# Patient Record
Sex: Female | Born: 1945 | Race: White | Hispanic: No | Marital: Single | Smoking: Never smoker
Health system: Southern US, Community
[De-identification: ages and names within clinical notes are randomized; demographics above are authoritative.]

## PROBLEM LIST (undated history)

## (undated) DIAGNOSIS — F329 Major depressive disorder, single episode, unspecified: Secondary | ICD-10-CM

## (undated) DIAGNOSIS — Z9989 Dependence on other enabling machines and devices: Secondary | ICD-10-CM

## (undated) DIAGNOSIS — F32A Depression, unspecified: Secondary | ICD-10-CM

## (undated) DIAGNOSIS — Z9889 Other specified postprocedural states: Secondary | ICD-10-CM

## (undated) DIAGNOSIS — M810 Age-related osteoporosis without current pathological fracture: Secondary | ICD-10-CM

## (undated) DIAGNOSIS — F411 Generalized anxiety disorder: Secondary | ICD-10-CM

## (undated) DIAGNOSIS — I447 Left bundle-branch block, unspecified: Secondary | ICD-10-CM

## (undated) DIAGNOSIS — I491 Atrial premature depolarization: Secondary | ICD-10-CM

## (undated) DIAGNOSIS — I440 Atrioventricular block, first degree: Secondary | ICD-10-CM

## (undated) DIAGNOSIS — M1711 Unilateral primary osteoarthritis, right knee: Secondary | ICD-10-CM

## (undated) DIAGNOSIS — K219 Gastro-esophageal reflux disease without esophagitis: Secondary | ICD-10-CM

## (undated) DIAGNOSIS — R897 Abnormal histological findings in specimens from other organs, systems and tissues: Secondary | ICD-10-CM

## (undated) DIAGNOSIS — M797 Fibromyalgia: Secondary | ICD-10-CM

## (undated) DIAGNOSIS — G4733 Obstructive sleep apnea (adult) (pediatric): Secondary | ICD-10-CM

## (undated) DIAGNOSIS — K589 Irritable bowel syndrome without diarrhea: Secondary | ICD-10-CM

## (undated) DIAGNOSIS — R001 Bradycardia, unspecified: Secondary | ICD-10-CM

## (undated) DIAGNOSIS — M351 Other overlap syndromes: Secondary | ICD-10-CM

## (undated) DIAGNOSIS — Z96652 Presence of left artificial knee joint: Secondary | ICD-10-CM

## (undated) DIAGNOSIS — Z9049 Acquired absence of other specified parts of digestive tract: Secondary | ICD-10-CM

## (undated) DIAGNOSIS — I10 Essential (primary) hypertension: Secondary | ICD-10-CM

## (undated) HISTORY — DX: Acquired absence of other specified parts of digestive tract: Z90.49

## (undated) HISTORY — DX: Atrioventricular block, first degree: I44.0

## (undated) HISTORY — DX: Presence of left artificial knee joint: Z96.652

## (undated) HISTORY — DX: Abnormal histological findings in specimens from other organs, systems and tissues: R89.7

## (undated) HISTORY — DX: Other specified postprocedural states: Z98.890

## (undated) HISTORY — DX: Left bundle-branch block, unspecified: I44.7

## (undated) HISTORY — PX: BACK SURGERY: SHX140

## (undated) HISTORY — PX: BREAST CYST EXCISION: SHX579

## (undated) HISTORY — PX: CHOLECYSTECTOMY: SHX55

---

## 2001-04-04 ENCOUNTER — Observation Stay (HOSPITAL_COMMUNITY): Admission: RE | Admit: 2001-04-04 | Discharge: 2001-04-05 | Payer: Self-pay | Admitting: Neurosurgery

## 2003-01-21 ENCOUNTER — Ambulatory Visit (HOSPITAL_BASED_OUTPATIENT_CLINIC_OR_DEPARTMENT_OTHER): Admission: RE | Admit: 2003-01-21 | Discharge: 2003-01-22 | Payer: Self-pay | Admitting: *Deleted

## 2004-10-16 HISTORY — PX: HAND SURGERY: SHX662

## 2006-03-27 ENCOUNTER — Emergency Department (HOSPITAL_COMMUNITY): Admission: EM | Admit: 2006-03-27 | Discharge: 2006-03-27 | Payer: Self-pay | Admitting: Emergency Medicine

## 2006-11-13 ENCOUNTER — Ambulatory Visit: Payer: Self-pay | Admitting: Gastroenterology

## 2006-11-13 ENCOUNTER — Ambulatory Visit (HOSPITAL_COMMUNITY): Admission: RE | Admit: 2006-11-13 | Discharge: 2006-11-13 | Payer: Self-pay | Admitting: Gastroenterology

## 2011-09-29 ENCOUNTER — Encounter: Payer: Self-pay | Admitting: Gastroenterology

## 2015-10-21 DIAGNOSIS — Z23 Encounter for immunization: Secondary | ICD-10-CM | POA: Diagnosis not present

## 2015-11-02 DIAGNOSIS — H25013 Cortical age-related cataract, bilateral: Secondary | ICD-10-CM | POA: Diagnosis not present

## 2015-11-02 DIAGNOSIS — H2511 Age-related nuclear cataract, right eye: Secondary | ICD-10-CM | POA: Diagnosis not present

## 2015-11-02 DIAGNOSIS — H25011 Cortical age-related cataract, right eye: Secondary | ICD-10-CM | POA: Diagnosis not present

## 2015-11-02 DIAGNOSIS — H2513 Age-related nuclear cataract, bilateral: Secondary | ICD-10-CM | POA: Diagnosis not present

## 2015-11-08 DIAGNOSIS — G4700 Insomnia, unspecified: Secondary | ICD-10-CM | POA: Diagnosis not present

## 2015-11-08 DIAGNOSIS — M797 Fibromyalgia: Secondary | ICD-10-CM | POA: Diagnosis not present

## 2015-11-08 DIAGNOSIS — F329 Major depressive disorder, single episode, unspecified: Secondary | ICD-10-CM | POA: Diagnosis not present

## 2015-11-08 DIAGNOSIS — I10 Essential (primary) hypertension: Secondary | ICD-10-CM | POA: Diagnosis not present

## 2015-11-24 DIAGNOSIS — H25011 Cortical age-related cataract, right eye: Secondary | ICD-10-CM | POA: Diagnosis not present

## 2015-11-30 DIAGNOSIS — Z418 Encounter for other procedures for purposes other than remedying health state: Secondary | ICD-10-CM | POA: Diagnosis not present

## 2015-11-30 DIAGNOSIS — E7800 Pure hypercholesterolemia, unspecified: Secondary | ICD-10-CM | POA: Diagnosis not present

## 2015-11-30 DIAGNOSIS — E559 Vitamin D deficiency, unspecified: Secondary | ICD-10-CM | POA: Diagnosis not present

## 2015-11-30 DIAGNOSIS — Z1389 Encounter for screening for other disorder: Secondary | ICD-10-CM | POA: Diagnosis not present

## 2015-11-30 DIAGNOSIS — R5383 Other fatigue: Secondary | ICD-10-CM | POA: Diagnosis not present

## 2015-11-30 DIAGNOSIS — Z0000 Encounter for general adult medical examination without abnormal findings: Secondary | ICD-10-CM | POA: Diagnosis not present

## 2015-11-30 DIAGNOSIS — I10 Essential (primary) hypertension: Secondary | ICD-10-CM | POA: Diagnosis not present

## 2015-11-30 DIAGNOSIS — Z1211 Encounter for screening for malignant neoplasm of colon: Secondary | ICD-10-CM | POA: Diagnosis not present

## 2015-12-15 DIAGNOSIS — H2512 Age-related nuclear cataract, left eye: Secondary | ICD-10-CM | POA: Diagnosis not present

## 2015-12-15 DIAGNOSIS — H2511 Age-related nuclear cataract, right eye: Secondary | ICD-10-CM | POA: Diagnosis not present

## 2015-12-15 DIAGNOSIS — H25012 Cortical age-related cataract, left eye: Secondary | ICD-10-CM | POA: Diagnosis not present

## 2015-12-15 DIAGNOSIS — H25011 Cortical age-related cataract, right eye: Secondary | ICD-10-CM | POA: Diagnosis not present

## 2015-12-23 DIAGNOSIS — E7800 Pure hypercholesterolemia, unspecified: Secondary | ICD-10-CM | POA: Diagnosis not present

## 2015-12-23 DIAGNOSIS — F329 Major depressive disorder, single episode, unspecified: Secondary | ICD-10-CM | POA: Diagnosis not present

## 2015-12-23 DIAGNOSIS — I10 Essential (primary) hypertension: Secondary | ICD-10-CM | POA: Diagnosis not present

## 2015-12-29 DIAGNOSIS — H2512 Age-related nuclear cataract, left eye: Secondary | ICD-10-CM | POA: Diagnosis not present

## 2015-12-29 DIAGNOSIS — H25012 Cortical age-related cataract, left eye: Secondary | ICD-10-CM | POA: Diagnosis not present

## 2016-02-07 DIAGNOSIS — Z789 Other specified health status: Secondary | ICD-10-CM | POA: Diagnosis not present

## 2016-02-07 DIAGNOSIS — I10 Essential (primary) hypertension: Secondary | ICD-10-CM | POA: Diagnosis not present

## 2016-02-07 DIAGNOSIS — M797 Fibromyalgia: Secondary | ICD-10-CM | POA: Diagnosis not present

## 2016-02-07 DIAGNOSIS — J069 Acute upper respiratory infection, unspecified: Secondary | ICD-10-CM | POA: Diagnosis not present

## 2016-02-14 DIAGNOSIS — E7800 Pure hypercholesterolemia, unspecified: Secondary | ICD-10-CM | POA: Diagnosis not present

## 2016-02-14 DIAGNOSIS — I10 Essential (primary) hypertension: Secondary | ICD-10-CM | POA: Diagnosis not present

## 2016-02-14 DIAGNOSIS — F329 Major depressive disorder, single episode, unspecified: Secondary | ICD-10-CM | POA: Diagnosis not present

## 2016-03-16 DIAGNOSIS — I10 Essential (primary) hypertension: Secondary | ICD-10-CM | POA: Diagnosis not present

## 2016-03-16 DIAGNOSIS — E7800 Pure hypercholesterolemia, unspecified: Secondary | ICD-10-CM | POA: Diagnosis not present

## 2016-03-16 DIAGNOSIS — F329 Major depressive disorder, single episode, unspecified: Secondary | ICD-10-CM | POA: Diagnosis not present

## 2016-04-12 DIAGNOSIS — K219 Gastro-esophageal reflux disease without esophagitis: Secondary | ICD-10-CM | POA: Diagnosis not present

## 2016-04-12 DIAGNOSIS — Z7901 Long term (current) use of anticoagulants: Secondary | ICD-10-CM | POA: Diagnosis not present

## 2016-04-12 DIAGNOSIS — M810 Age-related osteoporosis without current pathological fracture: Secondary | ICD-10-CM | POA: Diagnosis not present

## 2016-04-12 DIAGNOSIS — R404 Transient alteration of awareness: Secondary | ICD-10-CM | POA: Diagnosis not present

## 2016-04-12 DIAGNOSIS — R42 Dizziness and giddiness: Secondary | ICD-10-CM | POA: Diagnosis not present

## 2016-04-12 DIAGNOSIS — R531 Weakness: Secondary | ICD-10-CM | POA: Diagnosis not present

## 2016-04-12 DIAGNOSIS — R001 Bradycardia, unspecified: Secondary | ICD-10-CM | POA: Diagnosis not present

## 2016-04-12 DIAGNOSIS — M797 Fibromyalgia: Secondary | ICD-10-CM | POA: Diagnosis not present

## 2016-04-12 DIAGNOSIS — R252 Cramp and spasm: Secondary | ICD-10-CM | POA: Diagnosis not present

## 2016-04-12 DIAGNOSIS — Z79899 Other long term (current) drug therapy: Secondary | ICD-10-CM | POA: Diagnosis not present

## 2016-04-12 DIAGNOSIS — I10 Essential (primary) hypertension: Secondary | ICD-10-CM | POA: Diagnosis not present

## 2016-04-13 ENCOUNTER — Inpatient Hospital Stay (HOSPITAL_COMMUNITY)
Admission: EM | Admit: 2016-04-13 | Discharge: 2016-04-14 | DRG: 310 | Disposition: A | Discharge status: Home / Self Care | Payer: Medicare Other | Source: Other Acute Inpatient Hospital | Attending: Cardiology | Admitting: Cardiology

## 2016-04-13 ENCOUNTER — Encounter (HOSPITAL_COMMUNITY): Payer: Self-pay | Admitting: *Deleted

## 2016-04-13 DIAGNOSIS — R001 Bradycardia, unspecified: Secondary | ICD-10-CM | POA: Diagnosis present

## 2016-04-13 DIAGNOSIS — Z791 Long term (current) use of non-steroidal anti-inflammatories (NSAID): Secondary | ICD-10-CM | POA: Diagnosis not present

## 2016-04-13 DIAGNOSIS — K589 Irritable bowel syndrome without diarrhea: Secondary | ICD-10-CM | POA: Diagnosis present

## 2016-04-13 DIAGNOSIS — R55 Syncope and collapse: Secondary | ICD-10-CM | POA: Diagnosis not present

## 2016-04-13 DIAGNOSIS — I10 Essential (primary) hypertension: Secondary | ICD-10-CM | POA: Diagnosis present

## 2016-04-13 DIAGNOSIS — R0602 Shortness of breath: Secondary | ICD-10-CM | POA: Diagnosis not present

## 2016-04-13 DIAGNOSIS — Z6828 Body mass index (BMI) 28.0-28.9, adult: Secondary | ICD-10-CM

## 2016-04-13 DIAGNOSIS — K219 Gastro-esophageal reflux disease without esophagitis: Secondary | ICD-10-CM | POA: Diagnosis present

## 2016-04-13 DIAGNOSIS — I495 Sick sinus syndrome: Secondary | ICD-10-CM

## 2016-04-13 DIAGNOSIS — E7800 Pure hypercholesterolemia, unspecified: Secondary | ICD-10-CM | POA: Diagnosis present

## 2016-04-13 DIAGNOSIS — R0989 Other specified symptoms and signs involving the circulatory and respiratory systems: Secondary | ICD-10-CM | POA: Diagnosis present

## 2016-04-13 DIAGNOSIS — Z86711 Personal history of pulmonary embolism: Secondary | ICD-10-CM | POA: Diagnosis not present

## 2016-04-13 DIAGNOSIS — Z8249 Family history of ischemic heart disease and other diseases of the circulatory system: Secondary | ICD-10-CM

## 2016-04-13 DIAGNOSIS — Z79899 Other long term (current) drug therapy: Secondary | ICD-10-CM | POA: Diagnosis not present

## 2016-04-13 DIAGNOSIS — T447X5A Adverse effect of beta-adrenoreceptor antagonists, initial encounter: Secondary | ICD-10-CM | POA: Diagnosis present

## 2016-04-13 DIAGNOSIS — M797 Fibromyalgia: Secondary | ICD-10-CM | POA: Diagnosis present

## 2016-04-13 DIAGNOSIS — E669 Obesity, unspecified: Secondary | ICD-10-CM | POA: Diagnosis present

## 2016-04-13 DIAGNOSIS — I455 Other specified heart block: Principal | ICD-10-CM | POA: Diagnosis present

## 2016-04-13 DIAGNOSIS — I959 Hypotension, unspecified: Secondary | ICD-10-CM | POA: Diagnosis present

## 2016-04-13 DIAGNOSIS — M810 Age-related osteoporosis without current pathological fracture: Secondary | ICD-10-CM | POA: Diagnosis present

## 2016-04-13 HISTORY — DX: Fibromyalgia: M79.7

## 2016-04-13 HISTORY — DX: Essential (primary) hypertension: I10

## 2016-04-13 HISTORY — DX: Age-related osteoporosis without current pathological fracture: M81.0

## 2016-04-13 HISTORY — DX: Gastro-esophageal reflux disease without esophagitis: K21.9

## 2016-04-13 HISTORY — DX: Irritable bowel syndrome, unspecified: K58.9

## 2016-04-13 LAB — LIPID PANEL
CHOL/HDL RATIO: 3.4 ratio
Cholesterol: 203 mg/dL — ABNORMAL HIGH (ref 0–200)
HDL: 59 mg/dL (ref >40)
LDL Cholesterol: 125 mg/dL — ABNORMAL HIGH (ref 0–99)
TRIGLYCERIDES: 94 mg/dL (ref <150)
VLDL: 19 mg/dL (ref 0–40)

## 2016-04-13 LAB — COMPREHENSIVE METABOLIC PANEL
ALBUMIN: 3.2 g/dL — AB (ref 3.5–5.0)
ALT: 41 U/L (ref 14–54)
ANION GAP: 5 (ref 5–15)
AST: 57 U/L — AB (ref 15–41)
Alkaline Phosphatase: 54 U/L (ref 38–126)
BILIRUBIN TOTAL: 0.4 mg/dL (ref 0.3–1.2)
BUN: 27 mg/dL — ABNORMAL HIGH (ref 6–20)
CHLORIDE: 111 mmol/L (ref 101–111)
CO2: 24 mmol/L (ref 22–32)
Calcium: 8 mg/dL — ABNORMAL LOW (ref 8.9–10.3)
Creatinine, Ser: 1.59 mg/dL — ABNORMAL HIGH (ref 0.44–1.00)
GFR calc Af Amer: 37 mL/min — ABNORMAL LOW (ref >60)
GFR calc non Af Amer: 32 mL/min — ABNORMAL LOW (ref >60)
GLUCOSE: 100 mg/dL — AB (ref 65–99)
POTASSIUM: 5.1 mmol/L (ref 3.5–5.1)
SODIUM: 140 mmol/L (ref 135–145)
TOTAL PROTEIN: 5.5 g/dL — AB (ref 6.5–8.1)

## 2016-04-13 LAB — CBC
HEMATOCRIT: 31.6 % — AB (ref 36.0–46.0)
Hemoglobin: 10.1 g/dL — ABNORMAL LOW (ref 12.0–15.0)
MCH: 27.8 pg (ref 26.0–34.0)
MCHC: 32 g/dL (ref 30.0–36.0)
MCV: 87.1 fL (ref 78.0–100.0)
PLATELETS: 199 10*3/uL (ref 150–400)
RBC: 3.63 MIL/uL — ABNORMAL LOW (ref 3.87–5.11)
RDW: 12.6 % (ref 11.5–15.5)
WBC: 7.3 10*3/uL (ref 4.0–10.5)

## 2016-04-13 LAB — MRSA PCR SCREENING: MRSA by PCR: NEGATIVE

## 2016-04-13 LAB — TSH: TSH: 1.831 u[IU]/mL (ref 0.350–4.500)

## 2016-04-13 MED ORDER — SERTRALINE HCL 50 MG PO TABS
150.0000 mg | ORAL_TABLET | Freq: Every day | ORAL | Status: DC
  Administered 2016-04-13 – 2016-04-14 (×2): 150 mg via ORAL
  Filled 2016-04-13 (×2): qty 1

## 2016-04-13 MED ORDER — MAGNESIUM HYDROXIDE 400 MG/5ML PO SUSP: 30.0000 mL | Freq: Every day | ORAL | Status: DC | PRN

## 2016-04-13 MED ORDER — ENOXAPARIN SODIUM 40 MG/0.4ML ~~LOC~~ SOLN
40.0000 mg | SUBCUTANEOUS | Status: DC
  Administered 2016-04-13: 40 mg via SUBCUTANEOUS
  Filled 2016-04-13: qty 0.4

## 2016-04-13 MED ORDER — PANTOPRAZOLE SODIUM 40 MG PO TBEC
40.0000 mg | DELAYED_RELEASE_TABLET | Freq: Every day | ORAL | Status: DC
  Administered 2016-04-13 – 2016-04-14 (×2): 40 mg via ORAL
  Filled 2016-04-13 (×2): qty 1

## 2016-04-13 MED ORDER — ASPIRIN 81 MG PO CHEW
81.0000 mg | CHEWABLE_TABLET | Freq: Every day | ORAL | Status: DC
  Administered 2016-04-13 – 2016-04-14 (×2): 81 mg via ORAL
  Filled 2016-04-13 (×2): qty 1

## 2016-04-13 MED ORDER — OMEGA-3-ACID ETHYL ESTERS 1 G PO CAPS
1.0000 g | ORAL_CAPSULE | Freq: Two times a day (BID) | ORAL | Status: DC
  Administered 2016-04-13 – 2016-04-14 (×2): 1 g via ORAL
  Filled 2016-04-13 (×2): qty 1

## 2016-04-13 MED ORDER — ALUM & MAG HYDROXIDE-SIMETH 200-200-20 MG/5ML PO SUSP: 30.0000 mL | ORAL | Status: DC | PRN

## 2016-04-13 MED ORDER — BUSPIRONE HCL 15 MG PO TABS
15.0000 mg | ORAL_TABLET | Freq: Two times a day (BID) | ORAL | Status: DC
  Administered 2016-04-13 – 2016-04-14 (×2): 15 mg via ORAL
  Filled 2016-04-13 (×2): qty 1

## 2016-04-13 MED ORDER — GUAIFENESIN-DM 100-10 MG/5ML PO SYRP: 15.0000 mL | ORAL_SOLUTION | ORAL | Status: DC | PRN

## 2016-04-13 MED ORDER — LOPERAMIDE HCL 2 MG PO CAPS: 2.0000 mg | ORAL_CAPSULE | ORAL | Status: DC | PRN

## 2016-04-13 MED ORDER — MELATONIN 3 MG PO TABS
3.0000 mg | ORAL_TABLET | Freq: Every day | ORAL | Status: DC
  Administered 2016-04-13: 3 mg via ORAL
  Filled 2016-04-13: qty 1

## 2016-04-13 MED ORDER — LORAZEPAM 0.5 MG PO TABS
0.5000 mg | ORAL_TABLET | Freq: Every day | ORAL | Status: DC
  Administered 2016-04-13: 0.5 mg via ORAL
  Filled 2016-04-13: qty 1

## 2016-04-13 MED ORDER — LORATADINE 10 MG PO TABS
10.0000 mg | ORAL_TABLET | Freq: Every day | ORAL | Status: DC
  Administered 2016-04-13 – 2016-04-14 (×2): 10 mg via ORAL
  Filled 2016-04-13 (×2): qty 1

## 2016-04-13 MED ORDER — CETYLPYRIDINIUM CHLORIDE 0.05 % MT LIQD
7.0000 mL | Freq: Two times a day (BID) | OROMUCOSAL | Status: DC
  Administered 2016-04-13 – 2016-04-14 (×3): 7 mL via OROMUCOSAL

## 2016-04-13 MED ORDER — PSYLLIUM 95 % PO PACK
1.0000 | PACK | Freq: Every day | ORAL | Status: DC
  Administered 2016-04-13: 1 via ORAL
  Filled 2016-04-13 (×3): qty 1

## 2016-04-13 MED ORDER — TRAMADOL HCL 50 MG PO TABS: 50.0000 mg | ORAL_TABLET | Freq: Two times a day (BID) | ORAL | Status: DC | PRN

## 2016-04-13 MED ORDER — DOPAMINE-DEXTROSE 3.2-5 MG/ML-% IV SOLN
0.0000 ug/kg/min | INTRAVENOUS | Status: DC
  Administered 2016-04-13: 5 ug/kg/min via INTRAVENOUS

## 2016-04-13 MED ORDER — OXYBUTYNIN CHLORIDE 5 MG PO TABS
5.0000 mg | ORAL_TABLET | Freq: Every day | ORAL | Status: DC
  Administered 2016-04-13 – 2016-04-14 (×2): 5 mg via ORAL
  Filled 2016-04-13 (×2): qty 1

## 2016-04-13 MED ORDER — SODIUM CHLORIDE 0.9% FLUSH
3.0000 mL | Freq: Two times a day (BID) | INTRAVENOUS | Status: DC
  Administered 2016-04-13 – 2016-04-14 (×3): 3 mL via INTRAVENOUS

## 2016-04-13 MED ORDER — ONDANSETRON HCL 4 MG/2ML IJ SOLN: 4.0000 mg | Freq: Four times a day (QID) | INTRAMUSCULAR | Status: DC | PRN

## 2016-04-13 NOTE — Consult Note (Signed)
ELECTROPHYSIOLOGY CONSULT NOTE    Patient ID: Susan Hicks MRN: [id number], DOB/AGE: [birth year] 70 y.o.  Admit date: 04/13/2016 Date of Consult: 04/13/2016   Primary Physician: Glenda Chroman, MD Primary Cardiologist: Theodis Sato (new) Requesting MD: [facility]  Reason for Consultation: symptomatic bradycardia  HPI: Susan Hicks is a 70 y.o. female with PMhx of HTN, obesity remote hx of PE (no longer on a/c), GERD, fibromyalgia, IBS, osteoperosis, developed a sudden onset of extreme weakness, called her daughter who found her pulse slow, called 911.  She was noted to be in junctional bradycardia at 33bpm, her son in law at bedside (who is a paramedic) reports she was given atropine without response and transported.  She was eventually started on dopamine with improvement in her HR and symptoms.  She reports for a bout 2 months feeling more tired then usual, her daughter noted she has been easily winded as well. No CP, palpitations. Syncope is mentioned in the chart but the patient and family at bedside deny this.  [place] labs:  04/12/16 @2216  BUN/Creat 25/1.19K+ 5.6 Trop <0.01 H/H10/32 plts 239 WBC 9.2  TSH is drawn, pending  Patient has been on metoprolol 50mg  daily for many years, last dose was 04/12/16 at 1500  Past Medical History  Diagnosis Date  . GERD (gastroesophageal reflux disease)   . Hypertension   . Fibromyalgia   . Irritable bowel   . Osteoporosis      Surgical History:  Past Surgical History  Procedure Laterality Date  . Cholecystectomy    . Back surgery       Prescriptions prior to admission  Medication Sig Dispense Refill Last Dose  . busPIRone (BUSPAR) 15 MG tablet Take 15 mg by mouth 2 (two) times daily.    at Unknown time  . cetirizine (ZYRTEC) 10 MG tablet Take 10 mg by mouth daily.     Marland Kitchen lisinopril (PRINIVIL,ZESTRIL) 10 MG tablet Take 10 mg by mouth daily.     . meloxicam (MOBIC) 7.5 MG tablet Take 7.5 mg by mouth 2 (two) times daily.     at Unknown time  . metoprolol succinate (TOPROL-XL) 50 MG 24 hr tablet Take 50 mg by mouth daily. Take with or immediately following a meal.    at 8a  . oxybutynin (DITROPAN-XL) 10 MG 24 hr tablet Take 10 mg by mouth at bedtime.     . pantoprazole (PROTONIX) 40 MG tablet Take 40 mg by mouth daily.     . psyllium (METAMUCIL) 58.6 % packet Take 1 packet by mouth daily.     . sertraline (ZOLOFT) 100 MG tablet Take 150 mg by mouth daily.       Inpatient Medications:  . antiseptic oral rinse  7 mL Mouth Rinse BID  . aspirin  81 mg Oral Daily  . enoxaparin (LOVENOX) injection  40 mg Subcutaneous Q24H  . sodium chloride flush  3 mL Intravenous Q12H    Allergies: No Known Allergies  Social History   Social History  . Marital Status: Single    Spouse Name: N/A  . Number of Children: N/A  . Years of Education: N/A   Occupational History  . Not on file.   Social History Main Topics  . Smoking status: Never Smoker   . Smokeless tobacco: Not on file  . Alcohol Use: No  . Drug Use: No  . Sexual Activity: Not on file   Other Topics Concern  . Not on file   Social History Narrative  .  No narrative on file     Family History  Problem Relation Age of Onset  . Heart attack Father   . Pulmonary disease Father   . Thyroid disease Mother   . Thyroid disease Sister   . Hypertension Mother   . Hypertension Father   . Hypertension Sister   . Hypertension Daughter   . Hypertension Son      Review of Systems All other systems reviewed and are otherwise negative except as noted above.  Physical Exam: Filed Vitals:   04/13/16 0900 04/13/16 1000 04/13/16 1100 04/13/16 1200  BP: 97/56 101/50 75/51 103/48  Pulse: 80 72    Temp:      TempSrc:      Resp: 21 17 17 14   Height:      Weight:      SpO2: 96% 94%      GEN- The patient is ill appearing, in NAD, alert and oriented x 3 today.   HEENT: normocephalic, atraumatic; sclera clear, conjunctiva pink; hearing intact; oropharynx  clear; neck supple, no JVP Lymph- no cervical lymphadenopathy Lungs- Clear to ausculation bilaterally, normal work of breathing.  No wheezes, rales, rhonchi Heart- Regular rate and rhythm, bradycardic, no significnt murmurs, rubs or gallops, PMI not laterally displaced GI- soft, non-tender, non-distended, bowel sounds present Extremities- no clubbing, cyanosis, or edema, extremities are warm MS- no significant deformity or atrophy Skin- warm and dry, no rash or lesion Psych- euthymic mood, full affect Neuro- no gross deficits observed  Labs:   Lab Results  Component Value Date   WBC 7.3 04/13/2016   HGB 10.1* 04/13/2016   HCT 31.6* 04/13/2016   MCV 87.1 04/13/2016   PLT 199 04/13/2016   No results for input(s): NA, K, CL, CO2, BUN, CREATININE, CALCIUM, PROT, BILITOT, ALKPHOS, ALT, AST, GLUCOSE in the last 168 hours.  Invalid input(s): LABALBU    Radiology/Studies: No results found.  EKG: junctional bradycardia 33bpm TELEMETRY: junctional rhythm 30's without dopamine, 40's with, she has some sinus beats, and noted SR 70's earlier (on dopamine) while OOB.    Assessment and Plan:   1. Symptomatic bradycardia, junctional bradycardia     Chronically on metoprolol 50mg  daily, last dose 04/12/16 at 3PM     Will need to wait longer for full wash out      Check TSH and echo  2. HTN     Currently on dopamine     Signed, Jodelle Green 04/13/2016 12:33 PM  EP Attending  Patient seen and examined. Agree with the findings as documented above by Tommye Standard, PA-C. The patient has sinus node dysfunction but has been on beta blockers which are now about 22 hours from the last dose. I have recommended PPM insertion unless the sinus node function returns. Will observe overnight, check TSH, and continue holding beta blocker. If her HR remains in the 30's then she will need PPM insertion. Dr. Greggory Brandy will be here tomorrow and place her device if she still needs pacing. Will check 2 D echo  as she has felt very fatigued over the past few weeks.  Mikle Bosworth.D.

## 2016-04-13 NOTE — Progress Notes (Signed)
   04/13/16 0900  Clinical Encounter Type  Visited With Patient and family together  Visit Type Initial;Social support;Spiritual support;Other (Comment) (Adv Directive)  Referral From Patient;Nurse  [place] visited with pt and son at bedside and reviewed HCPOA literature.  When completed contact [place] to arrange notary and witnesses to complete. 9:53 AM Gwynn Burly

## 2016-04-13 NOTE — Care Management Important Message (Signed)
Important Message  Patient Details  Name: Susan Hicks MRN: [id number] Date of Birth: Feb 24, 1946   Medicare Important Message Given:  Yes    Loann Quill 04/13/2016, 9:50 AM

## 2016-04-13 NOTE — Progress Notes (Signed)
Pt off dopamine since 1004. At 1115 pt's HR back in the 40's and BP 75/51. Pt states she feels "weaker" when HR drops. MD Einar Gip paged. Dopamine restarted at 75mcg at 1119. Will continue to monitor.

## 2016-04-13 NOTE — Progress Notes (Signed)
Paged MD Einar Gip because pt inquired about her PM medications. MD to put in all PM meds.

## 2016-04-13 NOTE — H&P (Signed)
MAYANI Hicks is an 70 y.o. female.   Chief Complaint: nausea and syncope HPI: Susan Hicks  is a 69 y.o. female with a history of HTN, HLD (not on therapy), fibromyalgia, IBS, and GERD. She has been on metoprolol and lisinopril for many years for hypertension. Denies any other prior cardiac history. Around 8pm on 04/12/2016, she began feeling very weak and nauseous. She thought her blood sugar was low so she chewed some chocolate vitamins but continued to feel bad so she called her daughter who then called EMS. Per EMS report, while en route to [facility], pt had multiple syncopal episodes. She would report nausea and dizziness and become unresponsive for several minutes and then would have no recollection of event when waking. No chest pain, SOB, PND, orthopnea, or edema.  Past Medical History  Diagnosis Date  . GERD (gastroesophageal reflux disease)   . Hypertension   . Fibromyalgia   . Irritable bowel   . Osteoporosis     No past surgical history on file.  No family history on file. Social History:  has no tobacco, alcohol, and drug history on file.  Allergies: No Known Allergies  Review of Systems - History obtained from the patient General ROS: positive for  - fatigue and hot flashes negative for - fever, weight gain or weight loss Respiratory ROS: no cough, shortness of breath, or wheezing Cardiovascular ROS: no chest pain or dyspnea on exertion Gastrointestinal ROS: positive for - nausea Neurological ROS: no TIA or stroke symptoms positive for - syncope  Blood pressure 111/57, pulse 48, temperature 98.2 F (36.8 C), temperature source Oral, resp. rate 18, height 5\' 3"  (1.6 m), weight 73.3 kg (161 lb 9.6 oz), SpO2 94 %.   General appearance: alert, cooperative, appears stated age and no distress Eyes: negative Neck: no adenopathy, no carotid bruit, no JVD, supple, symmetrical, trachea midline and thyroid not enlarged, symmetric, no  tenderness/mass/nodules Resp: clear to auscultation bilaterally Chest wall: no tenderness Cardio: regular rate and rhythm, S1, S2 normal, no murmur, click, rub or gallop GI: soft, non-tender; bowel sounds normal; no masses,  no organomegaly Extremities: extremities normal, atraumatic, no cyanosis or edema Pulses: 2+ and symmetric Skin: Skin color, texture, turgor normal. No rashes or lesions Neurologic: Grossly normal  Results for orders placed or performed during the hospital encounter of 04/13/16 (from the past 48 hour(s))  MRSA PCR Screening     Status: None   Collection Time: 04/13/16  4:00 AM  Result Value Ref Range   MRSA by PCR NEGATIVE NEGATIVE    Comment:        The GeneXpert MRSA Assay (FDA approved for NASAL specimens only), is one component of a comprehensive MRSA colonization surveillance program. It is not intended to diagnose MRSA infection nor to guide or monitor treatment for MRSA infections.    No results found.  Labs:  No results found for: WBC, HGB, HCT, MCV, PLT No results for input(s): NA, K, CL, CO2, BUN, CREATININE, CALCIUM, PROT, BILITOT, ALKPHOS, ALT, AST, GLUCOSE in the last 168 hours.  Invalid input(s): LABALBU  Lipid Panel  No results found for: CHOL, TRIG, HDL, CHOLHDL, VLDL, LDLCALC  BNP (last 3 results) No results for input(s): BNP in the last 8760 hours.  HEMOGLOBIN A1C No results found for: HGBA1C, MPG  Cardiac Panel (last 3 results) No results for input(s): CKTOTAL, CKMB, TROPONINI, RELINDX in the last 8760 hours.  No results found for: CKTOTAL, CKMB, CKMBINDEX, TROPONINI   TSH No  results for input(s): TSH in the last 8760 hours.  EKG 04/13/2016 at 0421: junctional rhythm at a rate of 40bpm, normal axis, normal intervals, cannot exclude septal infarct old, no evidence of ischemia.  No prescriptions prior to admission      Current facility-administered medications:  .  alum & mag hydroxide-simeth (MAALOX/MYLANTA) 200-200-20  MG/5ML suspension 30 mL, 30 mL, Oral, Q2H PRN, Adrian Prows, MD .  antiseptic oral rinse (CPC / CETYLPYRIDINIUM CHLORIDE 0.05%) solution 7 mL, 7 mL, Mouth Rinse, BID, Adrian Prows, MD .  aspirin chewable tablet 81 mg, 81 mg, Oral, Daily, Adrian Prows, MD .  DOPamine (INTROPIN) 800 mg in dextrose 5 % 250 mL (3.2 mg/mL) infusion, 0-5 mcg/kg/min, Intravenous, Titrated, Adrian Prows, MD, Last Rate: 13.7 mL/hr at 04/13/16 0431, 10 mcg/kg/min at 04/13/16 0431 .  enoxaparin (LOVENOX) injection 40 mg, 40 mg, Subcutaneous, Q24H, Neldon Labella, NP .  guaiFENesin-dextromethorphan (ROBITUSSIN DM) 100-10 MG/5ML syrup 15 mL, 15 mL, Oral, Q4H PRN, Adrian Prows, MD .  loperamide (IMODIUM) capsule 2 mg, 2 mg, Oral, PRN, Adrian Prows, MD .  magnesium hydroxide (MILK OF MAGNESIA) suspension 30 mL, 30 mL, Oral, Daily PRN, Adrian Prows, MD .  ondansetron (ZOFRAN) injection 4-8 mg, 4-8 mg, Intravenous, Q6H PRN, Adrian Prows, MD .  sodium chloride flush (NS) 0.9 % injection 3 mL, 3 mL, Intravenous, Q12H, Neldon Labella, NP    Assessment/Plan 1. Symptomatic bradycardia 2. Syncope 3. Essential hypertension 4. Hyperlipidemia, group A 5. Fibromyalgia 6. IBS 7. GERD  Recommendation: Presented with symptomatic bradycardia with nausea and syncope. She has been on metoprolol for several years for hypertension, last dose around 3pm on 04/12/2016. Presently on dopamine gtt, per RN, HR drops into 30s each time they try to down titrate. Will continue for now.   Rachel Bo, NP-C 04/13/2016, 8:21 AM [facility]. [place] Pager: [phone] Office: [phone]

## 2016-04-14 ENCOUNTER — Other Ambulatory Visit (HOSPITAL_COMMUNITY): Payer: Medicare Other

## 2016-04-14 DIAGNOSIS — I10 Essential (primary) hypertension: Secondary | ICD-10-CM | POA: Diagnosis not present

## 2016-04-14 DIAGNOSIS — R55 Syncope and collapse: Secondary | ICD-10-CM | POA: Diagnosis not present

## 2016-04-14 DIAGNOSIS — R0602 Shortness of breath: Secondary | ICD-10-CM | POA: Diagnosis not present

## 2016-04-14 DIAGNOSIS — I495 Sick sinus syndrome: Secondary | ICD-10-CM | POA: Diagnosis not present

## 2016-04-14 LAB — HEMOGLOBIN A1C
HEMOGLOBIN A1C: 5.2 % (ref 4.8–5.6)
Mean Plasma Glucose: 103 mg/dL

## 2016-04-14 MED ORDER — ASPIRIN 81 MG PO CHEW: 81.0000 mg | CHEWABLE_TABLET | Freq: Every day | ORAL | Status: DC

## 2016-04-14 MED FILL — Ondansetron HCl Inj 4 MG/2ML (2 MG/ML): INTRAMUSCULAR | Qty: 2 | Status: AC

## 2016-04-14 MED FILL — Dopamine in Dextrose 5% Inj 3.2 MG/ML: INTRAVENOUS | Qty: 250 | Status: AC

## 2016-04-14 NOTE — Discharge Summary (Signed)
Physician Discharge Summary  Patient ID: Susan Hicks MRN: [id number] DOB/AGE: October 12, 1946 70 y.o.  Admit date: 04/13/2016 Discharge date: 04/14/2016  Primary Discharge Diagnosis  1.   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  Resolved on holding beta blockers. 2. Syncope 3. Essential hypertension 4. Hyperlipidemia, group A 5. Fibromyalgia 6. IBS 7. GERD 8. Left carotid bruit.  Hospital Course: Susan Hicks is a 70 y.o. female with a history of HTN, HLD (not on therapy), fibromyalgia, IBS, and GERD. She has been on metoprolol and lisinopril for many years for hypertension. Denies any other prior cardiac history. Around 8pm on 04/12/2016, she began feeling very weak and nauseous. She thought her blood sugar was low so she chewed some chocolate vitamins but continued to feel bad so she called her daughter who then called EMS. Per EMS report, while en route to [facility], pt had multiple syncopal episodes. She would report nausea and dizziness and become unresponsive for several minutes and then would have no recollection of event when waking. No chest pain, SOB, PND, orthopnea, or edema.  Patient over the past 1 year has noticed that her heart rate has been slow, prior to this year, heart rate used to be close to 90 bpm. Blood pressure well controlled on metoprolol. 3 months ago on office visit with her PCP, heart rate was noted to be 45 bpm.  Due to symptomatic sinus node arrest, junctional escape rhythm, hypertension, she was admitted to the intensive care unit on dopamine and transcutaneous pacemaker on standby more. Initially it was hard to take her off of dopamine, however after 36 hours of being off of metoprolol, this morning patient had converted to sinus rhythm and maintained sinus rhythm without dopamine. Blood pressure was also well controlled.  Hence it was felt  that pacemaker was not indicated and  To discontinue beta blockers and to avoid any negative chronotropic agents  And can be discharged.  Recommendations on discharge: avoid beta blockers and dihydropyridine calcium channel blocker. Avoid all negative chronotropic agents. Patient has left carotid bruit, needs carotid artery duplex.  She also needs echocardiogram along with routine treadmill excess stress test to exclude ischemic etiology, echocardiogram to evaluate for structural heart disease.  This will be arranged in outpatient basis.  Discharge Exam: Blood pressure 124/66, pulse 88, temperature 98.2 F (36.8 C), temperature source Oral, resp. rate 20, height 5\' 3"  (1.6 m), weight 73.3 kg (161 lb 9.6 oz), SpO2 95 %.   General appearance: alert, cooperative, appears stated age and no distress Eyes: negative Neck: no adenopathy, no carotid bruit, no JVD, supple, symmetrical, trachea midline and thyroid not enlarged, symmetric, no tenderness/mass/nodules Resp: clear to auscultation bilaterally Chest wall: no tenderness Cardio: regular rate and rhythm, S1, S2 normal, no murmur, click, rub or gallop GI: soft, non-tender; bowel sounds normal; no masses, no organomegaly Extremities: extremities normal, atraumatic, no cyanosis or edema Pulses: 2+ and symmetric Skin: Skin color, texture, turgor normal. No rashes or lesions Neurologic: Grossly normal Labs:   Lab Results  Component Value Date   WBC 7.3 04/13/2016   HGB 10.1* 04/13/2016   HCT 31.6* 04/13/2016   MCV 87.1 04/13/2016   PLT 199 04/13/2016    Recent Labs Lab 04/13/16 1102  NA 140  K 5.1  CL 111  CO2 24  BUN 27*  CREATININE 1.59*  CALCIUM 8.0*  PROT 5.5*  BILITOT 0.4  ALKPHOS 54  ALT 41  AST 57*  GLUCOSE 100*    Lipid Panel     Component Value Date/Time   CHOL 203* 04/13/2016 1102   TRIG 94 04/13/2016 1102   HDL 59 04/13/2016 1102   CHOLHDL 3.4 04/13/2016 1102   VLDL 19 04/13/2016 1102   LDLCALC 125*  04/13/2016 1102   HEMOGLOBIN A1C Lab Results  Component Value Date   HGBA1C 5.2 04/13/2016   MPG 103 04/13/2016    Recent Labs  04/13/16 1102  TSH 1.831    EKG 04/13/2016 at 0421: junctional rhythm at a rate of 40bpm, normal axis, normal intervals, cannot exclude septal infarct old, no evidence of ischemia. EKG 04/14/2016: Normal sinus rhythm, normal axis.  No evidence of ischemia, normal EKG.  FOLLOW UP PLANS AND APPOINTMENTS    Medication List    STOP taking these medications        metoprolol 50 MG tablet  Commonly known as:  LOPRESSOR      TAKE these medications        acetaminophen 500 MG tablet  Commonly known as:  TYLENOL  Take 750 mg by mouth at bedtime.     alendronate 70 MG tablet  Commonly known as:  FOSAMAX  Take 70 mg by mouth every Sunday. Take with a full glass of water on an empty stomach.     aspirin 81 MG chewable tablet  Chew 1 tablet (81 mg total) by mouth daily.     busPIRone 15 MG tablet  Commonly known as:  BUSPAR  Take 15 mg by mouth 2 (two) times daily.     cetirizine 10 MG tablet  Commonly known as:  ZYRTEC  Take 10 mg by mouth daily.     Fish Oil 1200 MG Caps  Take 1,200 mg by mouth daily.     lisinopril 10 MG tablet  Commonly known as:  PRINIVIL,ZESTRIL  Take 10 mg by mouth at bedtime.     LORazepam 0.5 MG tablet  Commonly known as:  ATIVAN  Take 0.5 mg by mouth at bedtime.     Magnesium-Zinc 133.33-5 MG Tabs  Take 1 tablet by mouth at bedtime.     Melatonin 5 MG Tabs  Take 5 mg by mouth at bedtime.     meloxicam 7.5 MG tablet  Commonly known as:  MOBIC  Take 7.5 mg by mouth 2 (two) times daily.     METAMUCIL PO  Take 10 mLs by mouth daily. Mix 2 teaspoonsful (10 mls) in liquid and drink     OVER THE COUNTER MEDICATION  Take 1 scoop by mouth daily. Vital Reds concentrated polyphenol blend - dietary supplement - mix 1 scoop in liquid and drink     OVER THE COUNTER MEDICATION  Place 1 drop into both eyes 3 (three)  times daily. Over the counter lubricating eye drop     oxybutynin 5 MG tablet  Commonly known as:  DITROPAN  Take 5 mg by mouth daily.     pantoprazole 40 MG tablet  Commonly known as:  PROTONIX  Take 40 mg by mouth daily.     PRESCRIPTION MEDICATION  Inhale into the lungs at bedtime. CPAP     sertraline 100 MG tablet  Commonly known as:  ZOLOFT  Take 150 mg by mouth daily.     Simethicone 125 MG Caps  Take 125 mg by mouth daily as needed (bloating/ gas).     SUPER B COMPLEX PO  Take 1 tablet by mouth daily.  traMADol 50 MG tablet  Commonly known as:  ULTRAM  Take 50 mg by mouth See admin instructions. Take 1 tablet (50 mg) by mouth every morning, may also take 1 tablet (50 mg) in the evening as needed for fibromyalgia pain     Turmeric Curcumin 500 MG Caps  Take 500 mg by mouth at bedtime.           Follow-up Information    Follow up with Adrian Prows, MD.   Specialty:  Cardiology   Why:  Come in for echocardiogram and carotid duplex on 04/25/16 at 2:30pm, office visit 05/01/16 at 2 pm. Please arrive at least 15 minutes prior. Bring all medications   Contact information:   [place] [place] [address] [phone]       Adrian Prows, MD 04/14/2016, 9:12 AM  Pager: [phone] Office: [phone] If no answer: [phone]

## 2016-04-14 NOTE — Progress Notes (Signed)
Discharged to home via w/c with family.  PIVs d/c'd w/pressure dressings at site.  Lt. AC PIV site w/reddness. Patient refused warm pack for site.  Clothing, jewelry and cell phone taken by patient.  Discharge instructions given w/all questions and concerns addressed and answered.  Patient awake, alert and oriented at time of discharge.

## 2016-04-14 NOTE — Progress Notes (Signed)
   SUBJECTIVE: The patient is doing well today.  At this time, she denies chest pain, shortness of breath, or any new concerns.  Bradycardia is resolved.   Marland Kitchen antiseptic oral rinse  7 mL Mouth Rinse BID  . aspirin  81 mg Oral Daily  . busPIRone  15 mg Oral BID  . enoxaparin (LOVENOX) injection  40 mg Subcutaneous Q24H  . loratadine  10 mg Oral Daily  . LORazepam  0.5 mg Oral QHS  . Melatonin  3 mg Oral QHS  . omega-3 acid ethyl esters  1 g Oral BID  . oxybutynin  5 mg Oral Daily  . pantoprazole  40 mg Oral Daily  . psyllium  1 packet Oral Daily  . sertraline  150 mg Oral Daily  . sodium chloride flush  3 mL Intravenous Q12H   . DOPamine Stopped (04/13/16 2113)    OBJECTIVE: Physical Exam: Filed Vitals:   04/14/16 0400 04/14/16 0500 04/14/16 0600 04/14/16 0700  BP: 110/60 121/64 122/72 124/66  Pulse:   85 88  Temp: 98.9 F (37.2 C)     TempSrc: Oral     Resp: 20 19 18 20   Height:      Weight:      SpO2: 98% 94% 95% 95%    Intake/Output Summary (Last 24 hours) at 04/14/16 0736 Last data filed at 04/13/16 2200  Gross per 24 hour  Intake 338.49 ml  Output      0 ml  Net 338.49 ml    Telemetry reveals sinus rhythm with V rates 80s  GEN- The patient is well appearing, alert and oriented x 3 today.   Head- normocephalic, atraumatic Eyes-  Sclera clear, conjunctiva pink Ears- hearing intact Oropharynx- clear Neck- supple,  Lungs- Clear to ausculation bilaterally, normal work of breathing Heart- Regular rate and rhythm, no murmurs, rubs or gallops, PMI not laterally displaced GI- soft, NT, ND, + BS Extremities- no clubbing, cyanosis, or edema Skin- no rash or lesion Psych- euthymic mood, full affect Neuro- strength and sensation are intact  LABS: Basic Metabolic Panel:  Recent Labs  04/13/16 1102  NA 140  K 5.1  CL 111  CO2 24  GLUCOSE 100*  BUN 27*  CREATININE 1.59*  CALCIUM 8.0*   Liver Function Tests:  Recent Labs  04/13/16 1102  AST 57*  ALT  41  ALKPHOS 54  BILITOT 0.4  PROT 5.5*  ALBUMIN 3.2*   No results for input(s): LIPASE, AMYLASE in the last 72 hours. CBC:  Recent Labs  04/13/16 1102  WBC 7.3  HGB 10.1*  HCT 31.6*  MCV 87.1  PLT 199   Cardiac Enzymes: No results for input(s): CKTOTAL, CKMB, CKMBINDEX, TROPONINI in the last 72 hours. BNP: Invalid input(s): POCBNP D-Dimer: No results for input(s): DDIMER in the last 72 hours. Hemoglobin A1C:  Recent Labs  04/13/16 1102  HGBA1C 5.2   Fasting Lipid Panel:  Recent Labs  04/13/16 1102  CHOL 203*  HDL 59  LDLCALC 125*  TRIG 94  CHOLHDL 3.4   Thyroid Function Tests:  Recent Labs  04/13/16 1102  TSH 1.831   ASSESSMENT AND PLAN:  Active Problems:   Bradycardia  1. Sinus bradycardia Bradycardia is resolved off of metoprolol  No indication for pacing at this time Avoid chronotropic depressing agents in the future  Electrophysiology team to see as needed while here. Please call with questions.    Thompson Grayer, MD 04/14/2016 7:36 AM

## 2016-04-21 DIAGNOSIS — Z09 Encounter for follow-up examination after completed treatment for conditions other than malignant neoplasm: Secondary | ICD-10-CM | POA: Diagnosis not present

## 2016-04-21 DIAGNOSIS — Z299 Encounter for prophylactic measures, unspecified: Secondary | ICD-10-CM | POA: Diagnosis not present

## 2016-04-21 DIAGNOSIS — I10 Essential (primary) hypertension: Secondary | ICD-10-CM | POA: Diagnosis not present

## 2016-04-21 DIAGNOSIS — K589 Irritable bowel syndrome without diarrhea: Secondary | ICD-10-CM | POA: Diagnosis not present

## 2016-04-21 DIAGNOSIS — R001 Bradycardia, unspecified: Secondary | ICD-10-CM | POA: Diagnosis not present

## 2016-04-25 DIAGNOSIS — R55 Syncope and collapse: Secondary | ICD-10-CM | POA: Diagnosis not present

## 2016-04-25 DIAGNOSIS — R000 Tachycardia, unspecified: Secondary | ICD-10-CM | POA: Diagnosis not present

## 2016-05-01 DIAGNOSIS — Z5309 Procedure and treatment not carried out because of other contraindication: Secondary | ICD-10-CM | POA: Diagnosis not present

## 2016-05-01 DIAGNOSIS — I10 Essential (primary) hypertension: Secondary | ICD-10-CM | POA: Diagnosis not present

## 2016-05-01 DIAGNOSIS — E7800 Pure hypercholesterolemia, unspecified: Secondary | ICD-10-CM | POA: Diagnosis not present

## 2016-05-08 DIAGNOSIS — R001 Bradycardia, unspecified: Secondary | ICD-10-CM | POA: Diagnosis not present

## 2016-05-08 DIAGNOSIS — E785 Hyperlipidemia, unspecified: Secondary | ICD-10-CM | POA: Diagnosis not present

## 2016-06-13 DIAGNOSIS — Z961 Presence of intraocular lens: Secondary | ICD-10-CM | POA: Diagnosis not present

## 2016-06-30 DIAGNOSIS — M797 Fibromyalgia: Secondary | ICD-10-CM | POA: Diagnosis not present

## 2016-06-30 DIAGNOSIS — I10 Essential (primary) hypertension: Secondary | ICD-10-CM | POA: Diagnosis not present

## 2016-07-26 DIAGNOSIS — E7800 Pure hypercholesterolemia, unspecified: Secondary | ICD-10-CM | POA: Diagnosis not present

## 2016-07-26 DIAGNOSIS — F329 Major depressive disorder, single episode, unspecified: Secondary | ICD-10-CM | POA: Diagnosis not present

## 2016-07-26 DIAGNOSIS — I10 Essential (primary) hypertension: Secondary | ICD-10-CM | POA: Diagnosis not present

## 2016-08-07 DIAGNOSIS — Z23 Encounter for immunization: Secondary | ICD-10-CM | POA: Diagnosis not present

## 2016-09-13 DIAGNOSIS — F329 Major depressive disorder, single episode, unspecified: Secondary | ICD-10-CM | POA: Diagnosis not present

## 2016-09-13 DIAGNOSIS — E7800 Pure hypercholesterolemia, unspecified: Secondary | ICD-10-CM | POA: Diagnosis not present

## 2016-09-13 DIAGNOSIS — I10 Essential (primary) hypertension: Secondary | ICD-10-CM | POA: Diagnosis not present

## 2016-09-29 DIAGNOSIS — Z6828 Body mass index (BMI) 28.0-28.9, adult: Secondary | ICD-10-CM | POA: Diagnosis not present

## 2016-09-29 DIAGNOSIS — M797 Fibromyalgia: Secondary | ICD-10-CM | POA: Diagnosis not present

## 2016-09-29 DIAGNOSIS — R252 Cramp and spasm: Secondary | ICD-10-CM | POA: Diagnosis not present

## 2016-09-29 DIAGNOSIS — Z299 Encounter for prophylactic measures, unspecified: Secondary | ICD-10-CM | POA: Diagnosis not present

## 2016-09-29 DIAGNOSIS — I10 Essential (primary) hypertension: Secondary | ICD-10-CM | POA: Diagnosis not present

## 2016-10-10 DIAGNOSIS — F329 Major depressive disorder, single episode, unspecified: Secondary | ICD-10-CM | POA: Diagnosis not present

## 2016-10-10 DIAGNOSIS — E7800 Pure hypercholesterolemia, unspecified: Secondary | ICD-10-CM | POA: Diagnosis not present

## 2016-10-10 DIAGNOSIS — I10 Essential (primary) hypertension: Secondary | ICD-10-CM | POA: Diagnosis not present

## 2016-11-14 DIAGNOSIS — I10 Essential (primary) hypertension: Secondary | ICD-10-CM | POA: Diagnosis not present

## 2016-11-14 DIAGNOSIS — E7800 Pure hypercholesterolemia, unspecified: Secondary | ICD-10-CM | POA: Diagnosis not present

## 2016-11-14 DIAGNOSIS — F329 Major depressive disorder, single episode, unspecified: Secondary | ICD-10-CM | POA: Diagnosis not present

## 2016-12-04 DIAGNOSIS — F329 Major depressive disorder, single episode, unspecified: Secondary | ICD-10-CM | POA: Diagnosis not present

## 2016-12-04 DIAGNOSIS — E7800 Pure hypercholesterolemia, unspecified: Secondary | ICD-10-CM | POA: Diagnosis not present

## 2016-12-04 DIAGNOSIS — I10 Essential (primary) hypertension: Secondary | ICD-10-CM | POA: Diagnosis not present

## 2016-12-06 DIAGNOSIS — Z1389 Encounter for screening for other disorder: Secondary | ICD-10-CM | POA: Diagnosis not present

## 2016-12-06 DIAGNOSIS — M797 Fibromyalgia: Secondary | ICD-10-CM | POA: Diagnosis not present

## 2016-12-06 DIAGNOSIS — E663 Overweight: Secondary | ICD-10-CM | POA: Diagnosis not present

## 2016-12-06 DIAGNOSIS — Z299 Encounter for prophylactic measures, unspecified: Secondary | ICD-10-CM | POA: Diagnosis not present

## 2016-12-06 DIAGNOSIS — Z1231 Encounter for screening mammogram for malignant neoplasm of breast: Secondary | ICD-10-CM | POA: Diagnosis not present

## 2016-12-06 DIAGNOSIS — Z79899 Other long term (current) drug therapy: Secondary | ICD-10-CM | POA: Diagnosis not present

## 2016-12-06 DIAGNOSIS — R5383 Other fatigue: Secondary | ICD-10-CM | POA: Diagnosis not present

## 2016-12-06 DIAGNOSIS — Z713 Dietary counseling and surveillance: Secondary | ICD-10-CM | POA: Diagnosis not present

## 2016-12-06 DIAGNOSIS — F329 Major depressive disorder, single episode, unspecified: Secondary | ICD-10-CM | POA: Diagnosis not present

## 2016-12-06 DIAGNOSIS — Z7189 Other specified counseling: Secondary | ICD-10-CM | POA: Diagnosis not present

## 2016-12-06 DIAGNOSIS — Z1211 Encounter for screening for malignant neoplasm of colon: Secondary | ICD-10-CM | POA: Diagnosis not present

## 2016-12-06 DIAGNOSIS — Z0000 Encounter for general adult medical examination without abnormal findings: Secondary | ICD-10-CM | POA: Diagnosis not present

## 2016-12-06 DIAGNOSIS — E7800 Pure hypercholesterolemia, unspecified: Secondary | ICD-10-CM | POA: Diagnosis not present

## 2017-02-01 DIAGNOSIS — F329 Major depressive disorder, single episode, unspecified: Secondary | ICD-10-CM | POA: Diagnosis not present

## 2017-02-01 DIAGNOSIS — I10 Essential (primary) hypertension: Secondary | ICD-10-CM | POA: Diagnosis not present

## 2017-02-01 DIAGNOSIS — E7800 Pure hypercholesterolemia, unspecified: Secondary | ICD-10-CM | POA: Diagnosis not present

## 2017-02-02 DIAGNOSIS — G4700 Insomnia, unspecified: Secondary | ICD-10-CM | POA: Diagnosis not present

## 2017-02-02 DIAGNOSIS — I10 Essential (primary) hypertension: Secondary | ICD-10-CM | POA: Diagnosis not present

## 2017-02-02 DIAGNOSIS — Z6830 Body mass index (BMI) 30.0-30.9, adult: Secondary | ICD-10-CM | POA: Diagnosis not present

## 2017-02-02 DIAGNOSIS — Z299 Encounter for prophylactic measures, unspecified: Secondary | ICD-10-CM | POA: Diagnosis not present

## 2017-02-02 DIAGNOSIS — Z789 Other specified health status: Secondary | ICD-10-CM | POA: Diagnosis not present

## 2017-02-05 DIAGNOSIS — M797 Fibromyalgia: Secondary | ICD-10-CM | POA: Diagnosis not present

## 2017-02-05 DIAGNOSIS — M1711 Unilateral primary osteoarthritis, right knee: Secondary | ICD-10-CM | POA: Diagnosis not present

## 2017-02-15 DIAGNOSIS — Z888 Allergy status to other drugs, medicaments and biological substances status: Secondary | ICD-10-CM | POA: Diagnosis not present

## 2017-02-15 DIAGNOSIS — Z1211 Encounter for screening for malignant neoplasm of colon: Secondary | ICD-10-CM | POA: Diagnosis not present

## 2017-02-15 DIAGNOSIS — M810 Age-related osteoporosis without current pathological fracture: Secondary | ICD-10-CM | POA: Diagnosis not present

## 2017-02-15 DIAGNOSIS — G4730 Sleep apnea, unspecified: Secondary | ICD-10-CM | POA: Diagnosis not present

## 2017-02-15 DIAGNOSIS — F329 Major depressive disorder, single episode, unspecified: Secondary | ICD-10-CM | POA: Diagnosis not present

## 2017-02-15 DIAGNOSIS — M797 Fibromyalgia: Secondary | ICD-10-CM | POA: Diagnosis not present

## 2017-02-15 DIAGNOSIS — I10 Essential (primary) hypertension: Secondary | ICD-10-CM | POA: Diagnosis not present

## 2017-02-15 DIAGNOSIS — K219 Gastro-esophageal reflux disease without esophagitis: Secondary | ICD-10-CM | POA: Diagnosis not present

## 2017-03-19 DIAGNOSIS — I10 Essential (primary) hypertension: Secondary | ICD-10-CM | POA: Diagnosis not present

## 2017-03-19 DIAGNOSIS — E7800 Pure hypercholesterolemia, unspecified: Secondary | ICD-10-CM | POA: Diagnosis not present

## 2017-03-19 DIAGNOSIS — F329 Major depressive disorder, single episode, unspecified: Secondary | ICD-10-CM | POA: Diagnosis not present

## 2017-03-21 DIAGNOSIS — M2391 Unspecified internal derangement of right knee: Secondary | ICD-10-CM | POA: Diagnosis not present

## 2017-03-21 DIAGNOSIS — M179 Osteoarthritis of knee, unspecified: Secondary | ICD-10-CM | POA: Diagnosis not present

## 2017-03-21 DIAGNOSIS — M1711 Unilateral primary osteoarthritis, right knee: Secondary | ICD-10-CM | POA: Diagnosis not present

## 2017-05-04 DIAGNOSIS — Z1389 Encounter for screening for other disorder: Secondary | ICD-10-CM | POA: Diagnosis not present

## 2017-05-04 DIAGNOSIS — Z713 Dietary counseling and surveillance: Secondary | ICD-10-CM | POA: Diagnosis not present

## 2017-05-04 DIAGNOSIS — I10 Essential (primary) hypertension: Secondary | ICD-10-CM | POA: Diagnosis not present

## 2017-05-04 DIAGNOSIS — E7800 Pure hypercholesterolemia, unspecified: Secondary | ICD-10-CM | POA: Diagnosis not present

## 2017-05-04 DIAGNOSIS — F329 Major depressive disorder, single episode, unspecified: Secondary | ICD-10-CM | POA: Diagnosis not present

## 2017-05-04 DIAGNOSIS — Z6830 Body mass index (BMI) 30.0-30.9, adult: Secondary | ICD-10-CM | POA: Diagnosis not present

## 2017-05-04 DIAGNOSIS — M797 Fibromyalgia: Secondary | ICD-10-CM | POA: Diagnosis not present

## 2017-05-04 DIAGNOSIS — Z299 Encounter for prophylactic measures, unspecified: Secondary | ICD-10-CM | POA: Diagnosis not present

## 2017-05-04 DIAGNOSIS — G4730 Sleep apnea, unspecified: Secondary | ICD-10-CM | POA: Diagnosis not present

## 2017-06-04 DIAGNOSIS — I10 Essential (primary) hypertension: Secondary | ICD-10-CM | POA: Diagnosis not present

## 2017-06-04 DIAGNOSIS — Z79899 Other long term (current) drug therapy: Secondary | ICD-10-CM | POA: Diagnosis not present

## 2017-06-04 DIAGNOSIS — Z299 Encounter for prophylactic measures, unspecified: Secondary | ICD-10-CM | POA: Diagnosis not present

## 2017-06-04 DIAGNOSIS — Z6829 Body mass index (BMI) 29.0-29.9, adult: Secondary | ICD-10-CM | POA: Diagnosis not present

## 2017-06-04 DIAGNOSIS — R0602 Shortness of breath: Secondary | ICD-10-CM | POA: Diagnosis not present

## 2017-06-04 DIAGNOSIS — R05 Cough: Secondary | ICD-10-CM | POA: Diagnosis not present

## 2017-06-04 DIAGNOSIS — Z713 Dietary counseling and surveillance: Secondary | ICD-10-CM | POA: Diagnosis not present

## 2017-06-04 DIAGNOSIS — G4700 Insomnia, unspecified: Secondary | ICD-10-CM | POA: Diagnosis not present

## 2017-06-04 DIAGNOSIS — R0600 Dyspnea, unspecified: Secondary | ICD-10-CM | POA: Diagnosis not present

## 2017-06-04 DIAGNOSIS — R5383 Other fatigue: Secondary | ICD-10-CM | POA: Diagnosis not present

## 2017-06-04 DIAGNOSIS — M797 Fibromyalgia: Secondary | ICD-10-CM | POA: Diagnosis not present

## 2017-06-05 DIAGNOSIS — F329 Major depressive disorder, single episode, unspecified: Secondary | ICD-10-CM | POA: Diagnosis not present

## 2017-06-05 DIAGNOSIS — I10 Essential (primary) hypertension: Secondary | ICD-10-CM | POA: Diagnosis not present

## 2017-06-05 DIAGNOSIS — E7800 Pure hypercholesterolemia, unspecified: Secondary | ICD-10-CM | POA: Diagnosis not present

## 2017-06-19 DIAGNOSIS — Z961 Presence of intraocular lens: Secondary | ICD-10-CM | POA: Diagnosis not present

## 2017-06-19 DIAGNOSIS — H04123 Dry eye syndrome of bilateral lacrimal glands: Secondary | ICD-10-CM | POA: Diagnosis not present

## 2017-06-27 ENCOUNTER — Ambulatory Visit (INDEPENDENT_AMBULATORY_CARE_PROVIDER_SITE_OTHER): Payer: Medicare Other | Admitting: Cardiology

## 2017-06-27 ENCOUNTER — Encounter: Payer: Self-pay | Admitting: Cardiology

## 2017-06-27 VITALS — BP 122/78 | HR 94 | Ht 63.0 in | Wt 155.0 lb

## 2017-06-27 DIAGNOSIS — R001 Bradycardia, unspecified: Secondary | ICD-10-CM | POA: Diagnosis not present

## 2017-06-27 DIAGNOSIS — R0602 Shortness of breath: Secondary | ICD-10-CM | POA: Diagnosis not present

## 2017-06-27 NOTE — Patient Instructions (Signed)

## 2017-06-27 NOTE — Progress Notes (Signed)
Clinical Summary Susan Hicks is a 71 y.o.female seen as new consult, referred by Dr Woody Seller for SOB.   1. SOB - about 1 month ago sudden breathlessness. Occurred while at home at rest. No chest pain. Felt weak all over. Layed down and checked pulse, felt irregular. Checked by her family that is a paramedic, checked and also irregular, did rhythm strip at the time and she was told she had PACs.  - symptosm about 1-2 hours - no recurrent episodes. No otherwise palpitations - does heavy hardwork, tolerates well   2. Sinus bradycardia - seen by EP 03/2016. Bradycardia resolved off metoprolol  3. Fibromyalgia - followed by pcp   Past Medical History:  Diagnosis Date  . Fibromyalgia   . GERD (gastroesophageal reflux disease)   . Hypertension   . Irritable bowel   . Osteoporosis      No Known Allergies   Current Outpatient Prescriptions  Medication Sig Dispense Refill  . acetaminophen (TYLENOL) 500 MG tablet Take 750 mg by mouth at bedtime.    Marland Kitchen alendronate (FOSAMAX) 70 MG tablet Take 70 mg by mouth every Sunday. Take with a full glass of water on an empty stomach.    Marland Kitchen aspirin 81 MG chewable tablet Chew 1 tablet (81 mg total) by mouth daily. 30 tablet 6  . B Complex-C (SUPER B COMPLEX PO) Take 1 tablet by mouth daily.    . busPIRone (BUSPAR) 15 MG tablet Take 15 mg by mouth 2 (two) times daily.    . cetirizine (ZYRTEC) 10 MG tablet Take 10 mg by mouth daily.    Marland Kitchen lisinopril (PRINIVIL,ZESTRIL) 10 MG tablet Take 10 mg by mouth at bedtime.     Marland Kitchen LORazepam (ATIVAN) 0.5 MG tablet Take 0.5 mg by mouth at bedtime.    . Magnesium-Zinc 133.33-5 MG TABS Take 1 tablet by mouth at bedtime.    . Melatonin 5 MG TABS Take 5 mg by mouth at bedtime.    . meloxicam (MOBIC) 7.5 MG tablet Take 7.5 mg by mouth 2 (two) times daily.    . Omega-3 Fatty Acids (FISH OIL) 1200 MG CAPS Take 1,200 mg by mouth daily.    Marland Kitchen OVER THE COUNTER MEDICATION Take 1 scoop by mouth daily. Vital Reds concentrated  polyphenol blend - dietary supplement - mix 1 scoop in liquid and drink    . OVER THE COUNTER MEDICATION Place 1 drop into both eyes 3 (three) times daily. Over the counter lubricating eye drop    . oxybutynin (DITROPAN) 5 MG tablet Take 5 mg by mouth daily.    . pantoprazole (PROTONIX) 40 MG tablet Take 40 mg by mouth daily.    Marland Kitchen PRESCRIPTION MEDICATION Inhale into the lungs at bedtime. CPAP    . Psyllium (METAMUCIL PO) Take 10 mLs by mouth daily. Mix 2 teaspoonsful (10 mls) in liquid and drink    . sertraline (ZOLOFT) 100 MG tablet Take 150 mg by mouth daily.    . Simethicone 125 MG CAPS Take 125 mg by mouth daily as needed (bloating/ gas).    . traMADol (ULTRAM) 50 MG tablet Take 50 mg by mouth See admin instructions. Take 1 tablet (50 mg) by mouth every morning, may also take 1 tablet (50 mg) in the evening as needed for fibromyalgia pain    . Turmeric Curcumin 500 MG CAPS Take 500 mg by mouth at bedtime.     No current facility-administered medications for this visit.  Past Surgical History:  Procedure Laterality Date  . BACK SURGERY    . CHOLECYSTECTOMY       No Known Allergies    Family History  Problem Relation Age of Onset  . Heart attack Father   . Pulmonary disease Father   . Thyroid disease Mother   . Thyroid disease Sister   . Hypertension Mother   . Hypertension Father   . Hypertension Sister   . Hypertension Daughter   . Hypertension Son      Social History Susan Hicks reports that she has never smoked. She does not have any smokeless tobacco history on file. Susan Hicks reports that she does not drink alcohol.   Review of Systems CONSTITUTIONAL: No weight loss, fever, chills, weakness or fatigue.  HEENT: Eyes: No visual loss, blurred vision, double vision or yellow sclerae.No hearing loss, sneezing, congestion, runny nose or sore throat.  SKIN: No rash or itching.  CARDIOVASCULAR: per hpi RESPIRATORY: per hpi GASTROINTESTINAL: No anorexia,  nausea, vomiting or diarrhea. No abdominal pain or blood.  GENITOURINARY: No burning on urination, no polyuria NEUROLOGICAL: No headache, dizziness, syncope, paralysis, ataxia, numbness or tingling in the extremities. No change in bowel or bladder control.  MUSCULOSKELETAL: No muscle, back pain, joint pain or stiffness.  LYMPHATICS: No enlarged nodes. No history of splenectomy.  PSYCHIATRIC: No history of depression or anxiety.  ENDOCRINOLOGIC: No reports of sweating, cold or heat intolerance. No polyuria or polydipsia.  Marland Kitchen   Physical Examination Vitals:   06/27/17 1044 06/27/17 1049  BP: (!) 147/86 122/78  Pulse: 95 94  SpO2: 96% 96%   Vitals:   06/27/17 1044  Weight: 155 lb (70.3 kg)  Height: 5\' 3"  (1.6 m)    Gen: resting comfortably, no acute distress HEENT: no scleral icterus, pupils equal round and reactive, no palptable cervical adenopathy,  CV: RRR, no m/r/g, no jvd Resp: Clear to auscultation bilaterally GI: abdomen is soft, non-tender, non-distended, normal bowel sounds, no hepatosplenomegaly MSK: extremities are warm, no edema.  Skin: warm, no rash Neuro:  no focal deficits Psych: appropriate affect     Assessment and Plan  1. SOB - isolated episode, from history sounds like she may have had a transient arrhythmia. No recurrent episodes. EKG today in clinic shows NSR - continue to monitor at this time, if recurrent epsiodes would consider a cardiac monitor.   2. Bradycardia - resolved off beta blockers - continue to monitor   Arnoldo Lenis, M.D.

## 2017-07-04 DIAGNOSIS — Z6830 Body mass index (BMI) 30.0-30.9, adult: Secondary | ICD-10-CM | POA: Diagnosis not present

## 2017-07-04 DIAGNOSIS — Z713 Dietary counseling and surveillance: Secondary | ICD-10-CM | POA: Diagnosis not present

## 2017-07-04 DIAGNOSIS — Z299 Encounter for prophylactic measures, unspecified: Secondary | ICD-10-CM | POA: Diagnosis not present

## 2017-07-04 DIAGNOSIS — J40 Bronchitis, not specified as acute or chronic: Secondary | ICD-10-CM | POA: Diagnosis not present

## 2017-07-12 DIAGNOSIS — F329 Major depressive disorder, single episode, unspecified: Secondary | ICD-10-CM | POA: Diagnosis not present

## 2017-07-12 DIAGNOSIS — I10 Essential (primary) hypertension: Secondary | ICD-10-CM | POA: Diagnosis not present

## 2017-07-12 DIAGNOSIS — E7800 Pure hypercholesterolemia, unspecified: Secondary | ICD-10-CM | POA: Diagnosis not present

## 2017-08-03 DIAGNOSIS — Z299 Encounter for prophylactic measures, unspecified: Secondary | ICD-10-CM | POA: Diagnosis not present

## 2017-08-03 DIAGNOSIS — R05 Cough: Secondary | ICD-10-CM | POA: Diagnosis not present

## 2017-08-03 DIAGNOSIS — M797 Fibromyalgia: Secondary | ICD-10-CM | POA: Diagnosis not present

## 2017-08-03 DIAGNOSIS — Z6829 Body mass index (BMI) 29.0-29.9, adult: Secondary | ICD-10-CM | POA: Diagnosis not present

## 2017-08-03 DIAGNOSIS — I10 Essential (primary) hypertension: Secondary | ICD-10-CM | POA: Diagnosis not present

## 2017-08-23 DIAGNOSIS — I10 Essential (primary) hypertension: Secondary | ICD-10-CM | POA: Diagnosis not present

## 2017-08-23 DIAGNOSIS — F329 Major depressive disorder, single episode, unspecified: Secondary | ICD-10-CM | POA: Diagnosis not present

## 2017-08-23 DIAGNOSIS — E7800 Pure hypercholesterolemia, unspecified: Secondary | ICD-10-CM | POA: Diagnosis not present

## 2017-09-26 DIAGNOSIS — F329 Major depressive disorder, single episode, unspecified: Secondary | ICD-10-CM | POA: Diagnosis not present

## 2017-09-26 DIAGNOSIS — E7800 Pure hypercholesterolemia, unspecified: Secondary | ICD-10-CM | POA: Diagnosis not present

## 2017-09-26 DIAGNOSIS — I10 Essential (primary) hypertension: Secondary | ICD-10-CM | POA: Diagnosis not present

## 2017-10-03 DIAGNOSIS — E7800 Pure hypercholesterolemia, unspecified: Secondary | ICD-10-CM | POA: Diagnosis not present

## 2017-10-03 DIAGNOSIS — Z6830 Body mass index (BMI) 30.0-30.9, adult: Secondary | ICD-10-CM | POA: Diagnosis not present

## 2017-10-03 DIAGNOSIS — I10 Essential (primary) hypertension: Secondary | ICD-10-CM | POA: Diagnosis not present

## 2017-10-03 DIAGNOSIS — Z789 Other specified health status: Secondary | ICD-10-CM | POA: Diagnosis not present

## 2017-10-03 DIAGNOSIS — F329 Major depressive disorder, single episode, unspecified: Secondary | ICD-10-CM | POA: Diagnosis not present

## 2017-10-03 DIAGNOSIS — G4730 Sleep apnea, unspecified: Secondary | ICD-10-CM | POA: Diagnosis not present

## 2017-10-03 DIAGNOSIS — M797 Fibromyalgia: Secondary | ICD-10-CM | POA: Diagnosis not present

## 2017-10-03 DIAGNOSIS — Z299 Encounter for prophylactic measures, unspecified: Secondary | ICD-10-CM | POA: Diagnosis not present

## 2017-10-03 DIAGNOSIS — Z23 Encounter for immunization: Secondary | ICD-10-CM | POA: Diagnosis not present

## 2017-10-03 DIAGNOSIS — R05 Cough: Secondary | ICD-10-CM | POA: Diagnosis not present

## 2017-10-17 DIAGNOSIS — F329 Major depressive disorder, single episode, unspecified: Secondary | ICD-10-CM | POA: Diagnosis not present

## 2017-10-17 DIAGNOSIS — Z789 Other specified health status: Secondary | ICD-10-CM | POA: Diagnosis not present

## 2017-10-17 DIAGNOSIS — G4730 Sleep apnea, unspecified: Secondary | ICD-10-CM | POA: Diagnosis not present

## 2017-10-17 DIAGNOSIS — I10 Essential (primary) hypertension: Secondary | ICD-10-CM | POA: Diagnosis not present

## 2017-10-17 DIAGNOSIS — Z6830 Body mass index (BMI) 30.0-30.9, adult: Secondary | ICD-10-CM | POA: Diagnosis not present

## 2017-10-17 DIAGNOSIS — Z299 Encounter for prophylactic measures, unspecified: Secondary | ICD-10-CM | POA: Diagnosis not present

## 2017-10-17 DIAGNOSIS — K219 Gastro-esophageal reflux disease without esophagitis: Secondary | ICD-10-CM | POA: Diagnosis not present

## 2017-10-24 ENCOUNTER — Encounter (INDEPENDENT_AMBULATORY_CARE_PROVIDER_SITE_OTHER): Payer: Self-pay | Admitting: Internal Medicine

## 2017-10-24 ENCOUNTER — Encounter (INDEPENDENT_AMBULATORY_CARE_PROVIDER_SITE_OTHER): Payer: Self-pay

## 2017-10-25 ENCOUNTER — Encounter (INDEPENDENT_AMBULATORY_CARE_PROVIDER_SITE_OTHER): Payer: Self-pay | Admitting: Internal Medicine

## 2017-10-25 ENCOUNTER — Ambulatory Visit (INDEPENDENT_AMBULATORY_CARE_PROVIDER_SITE_OTHER): Payer: Medicare Other | Admitting: Internal Medicine

## 2017-10-25 ENCOUNTER — Encounter (INDEPENDENT_AMBULATORY_CARE_PROVIDER_SITE_OTHER): Payer: Self-pay | Admitting: *Deleted

## 2017-10-25 VITALS — BP 170/100 | HR 80 | Temp 98.5°F | Ht 63.0 in | Wt 154.8 lb

## 2017-10-25 DIAGNOSIS — R1310 Dysphagia, unspecified: Secondary | ICD-10-CM | POA: Diagnosis not present

## 2017-10-25 DIAGNOSIS — R053 Chronic cough: Secondary | ICD-10-CM

## 2017-10-25 DIAGNOSIS — R05 Cough: Secondary | ICD-10-CM

## 2017-10-25 DIAGNOSIS — R1319 Other dysphagia: Secondary | ICD-10-CM

## 2017-10-25 NOTE — Progress Notes (Addendum)
Subjective:    Patient ID: Susan Hicks, female    DOB: 1946-10-15, 72 y.o.   MRN: [id number]  HPI Referred by Dr. Woody Seller for uncontrolled GERD. She tells me she has a chronic cough which will no go away. She has a cough for about 3 months. The Lisinopril was stopped and she improved some but her cough has improved. If she gets any kind of cold, she becomes hoarse.  She says she has GERD. When she bends over, she can feel acid in her esophagus.  She thinks she may be aspirating. When she drinks fluid, she had a cough. She stopped using a straw to drink, and does not have as much of a cough anymore. If she eats crackers, she coughs.   Her appetite is good. No weight loss. Usually has a BM x 3 a day.   Last colonoscopy was 1 year ago by Dr. Ladona Horns and was normal.    06/04/2017 Chest xray: Cough and SOB and irregular heart for past week. Non smoker. Impression: No pneumonia, CHF , nor other cardiopulmonary abnormality.    Review of Systems Past Medical History:  Diagnosis Date  . Fibromyalgia   . GERD (gastroesophageal reflux disease)   . Hypertension   . Irritable bowel   . Osteoporosis     Past Surgical History:  Procedure Laterality Date  . BACK SURGERY    . CHOLECYSTECTOMY      Allergies  Allergen Reactions  . Beta Adrenergic Blockers Other (See Comments)    Bottomed out heart rate     Current Outpatient Medications on File Prior to Visit  Medication Sig Dispense Refill  . acetaminophen (TYLENOL) 500 MG tablet Take 750 mg by mouth at bedtime.    Marland Kitchen aspirin 81 MG chewable tablet Chew 1 tablet (81 mg total) by mouth daily. 30 tablet 6  . B Complex-C (SUPER B COMPLEX PO) Take 1 tablet by mouth daily.    . busPIRone (BUSPAR) 15 MG tablet Take 15 mg by mouth 2 (two) times daily.    . cetirizine (ZYRTEC) 10 MG tablet Take 10 mg by mouth daily.    Marland Kitchen LORazepam (ATIVAN) 0.5 MG tablet Take 0.5 mg by mouth at bedtime.    . Magnesium-Zinc 133.33-5 MG TABS Take 1 tablet by  mouth at bedtime.    . Melatonin 5 MG TABS Take 5 mg by mouth at bedtime.    . meloxicam (MOBIC) 7.5 MG tablet Take 7.5 mg by mouth 2 (two) times daily.    Marland Kitchen olmesartan (BENICAR) 20 MG tablet Take 20 mg by mouth daily.    . Omega-3 Fatty Acids (FISH OIL) 1200 MG CAPS Take 1,200 mg by mouth daily.    Marland Kitchen OVER THE COUNTER MEDICATION Place 1 drop into both eyes 3 (three) times daily. Over the counter lubricating eye drop    . oxybutynin (DITROPAN) 5 MG tablet Take 5 mg by mouth daily.    . pantoprazole (PROTONIX) 40 MG tablet Take 40 mg by mouth daily.    Marland Kitchen PRESCRIPTION MEDICATION Inhale into the lungs at bedtime. CPAP    . Psyllium (METAMUCIL PO) Take 10 mLs by mouth daily. Mix 2 teaspoonsful (10 mls) in liquid and drink    . sertraline (ZOLOFT) 100 MG tablet Take 150 mg by mouth daily.    . traMADol (ULTRAM) 50 MG tablet Take 50 mg by mouth See admin instructions. Take 1 tablet (50 mg) by mouth every morning, may also take 1 tablet (50 mg)  in the evening as needed for fibromyalgia pain    . Turmeric Curcumin 500 MG CAPS Take 500 mg by mouth at bedtime.    Marland Kitchen OVER THE COUNTER MEDICATION Take 1 scoop by mouth daily. Vital Reds concentrated polyphenol blend - dietary supplement - mix 1 scoop in liquid and drink     No current facility-administered medications on file prior to visit.         Objective:   Physical Exam Blood pressure (!) 170/100, pulse 80, temperature 98.5 F (36.9 C), height 5\' 3"  (1.6 m), weight 154 lb 12.8 oz (70.2 kg).  Alert and oriented. Skin warm and dry. Oral mucosa is moist.   . Sclera anicteric, conjunctivae is pink. Thyroid not enlarged. No cervical lymphadenopathy. Lungs clear. Heart regular rate and rhythm.  Abdomen is soft. Bowel sounds are positive. No hepatomegaly. No abdominal masses felt. No tenderness.  No edema to lower extremities.          Assessment & Plan:  Chronic cough.  ? Dysphagia to liquids. ? Aspiration.  Am going to get an esophagram.  Further  recommendations to follow.

## 2017-10-25 NOTE — Patient Instructions (Signed)
Dysphagia to liquids. Am going to get an esophagram to be sure she is not aspiration.

## 2017-10-31 ENCOUNTER — Ambulatory Visit (HOSPITAL_COMMUNITY)
Admission: RE | Admit: 2017-10-31 | Discharge: 2017-10-31 | Disposition: A | Discharge status: Home / Self Care | Payer: Medicare Other | Source: Ambulatory Visit | Attending: Internal Medicine | Admitting: Internal Medicine

## 2017-10-31 ENCOUNTER — Encounter (HOSPITAL_COMMUNITY): Payer: Self-pay | Admitting: Radiology

## 2017-10-31 DIAGNOSIS — R05 Cough: Secondary | ICD-10-CM | POA: Diagnosis not present

## 2017-10-31 DIAGNOSIS — R1319 Other dysphagia: Secondary | ICD-10-CM

## 2017-10-31 DIAGNOSIS — R1310 Dysphagia, unspecified: Secondary | ICD-10-CM | POA: Diagnosis not present

## 2017-10-31 DIAGNOSIS — Q394 Esophageal web: Secondary | ICD-10-CM | POA: Insufficient documentation

## 2017-10-31 DIAGNOSIS — E7800 Pure hypercholesterolemia, unspecified: Secondary | ICD-10-CM | POA: Diagnosis not present

## 2017-10-31 DIAGNOSIS — R053 Chronic cough: Secondary | ICD-10-CM

## 2017-10-31 DIAGNOSIS — I10 Essential (primary) hypertension: Secondary | ICD-10-CM | POA: Diagnosis not present

## 2017-10-31 DIAGNOSIS — F329 Major depressive disorder, single episode, unspecified: Secondary | ICD-10-CM | POA: Diagnosis not present

## 2017-11-29 DIAGNOSIS — I10 Essential (primary) hypertension: Secondary | ICD-10-CM | POA: Diagnosis not present

## 2017-11-29 DIAGNOSIS — F329 Major depressive disorder, single episode, unspecified: Secondary | ICD-10-CM | POA: Diagnosis not present

## 2017-11-29 DIAGNOSIS — E7800 Pure hypercholesterolemia, unspecified: Secondary | ICD-10-CM | POA: Diagnosis not present

## 2017-12-10 DIAGNOSIS — Z1331 Encounter for screening for depression: Secondary | ICD-10-CM | POA: Diagnosis not present

## 2017-12-10 DIAGNOSIS — Z0000 Encounter for general adult medical examination without abnormal findings: Secondary | ICD-10-CM | POA: Diagnosis not present

## 2017-12-10 DIAGNOSIS — D649 Anemia, unspecified: Secondary | ICD-10-CM | POA: Diagnosis not present

## 2017-12-10 DIAGNOSIS — Z1339 Encounter for screening examination for other mental health and behavioral disorders: Secondary | ICD-10-CM | POA: Diagnosis not present

## 2017-12-10 DIAGNOSIS — I10 Essential (primary) hypertension: Secondary | ICD-10-CM | POA: Diagnosis not present

## 2017-12-10 DIAGNOSIS — Z1211 Encounter for screening for malignant neoplasm of colon: Secondary | ICD-10-CM | POA: Diagnosis not present

## 2017-12-10 DIAGNOSIS — Z6830 Body mass index (BMI) 30.0-30.9, adult: Secondary | ICD-10-CM | POA: Diagnosis not present

## 2017-12-10 DIAGNOSIS — Z79899 Other long term (current) drug therapy: Secondary | ICD-10-CM | POA: Diagnosis not present

## 2017-12-10 DIAGNOSIS — Z7189 Other specified counseling: Secondary | ICD-10-CM | POA: Diagnosis not present

## 2017-12-10 DIAGNOSIS — Z299 Encounter for prophylactic measures, unspecified: Secondary | ICD-10-CM | POA: Diagnosis not present

## 2017-12-17 DIAGNOSIS — M1711 Unilateral primary osteoarthritis, right knee: Secondary | ICD-10-CM | POA: Diagnosis not present

## 2017-12-18 DIAGNOSIS — E2839 Other primary ovarian failure: Secondary | ICD-10-CM | POA: Diagnosis not present

## 2017-12-18 DIAGNOSIS — M8580 Other specified disorders of bone density and structure, unspecified site: Secondary | ICD-10-CM | POA: Diagnosis not present

## 2017-12-24 DIAGNOSIS — E7800 Pure hypercholesterolemia, unspecified: Secondary | ICD-10-CM | POA: Diagnosis not present

## 2017-12-24 DIAGNOSIS — I10 Essential (primary) hypertension: Secondary | ICD-10-CM | POA: Diagnosis not present

## 2017-12-24 DIAGNOSIS — F329 Major depressive disorder, single episode, unspecified: Secondary | ICD-10-CM | POA: Diagnosis not present

## 2017-12-31 DIAGNOSIS — M1711 Unilateral primary osteoarthritis, right knee: Secondary | ICD-10-CM | POA: Diagnosis not present

## 2018-01-07 DIAGNOSIS — M1711 Unilateral primary osteoarthritis, right knee: Secondary | ICD-10-CM | POA: Diagnosis not present

## 2018-01-09 DIAGNOSIS — E7800 Pure hypercholesterolemia, unspecified: Secondary | ICD-10-CM | POA: Diagnosis not present

## 2018-01-09 DIAGNOSIS — Z6830 Body mass index (BMI) 30.0-30.9, adult: Secondary | ICD-10-CM | POA: Diagnosis not present

## 2018-01-09 DIAGNOSIS — Z713 Dietary counseling and surveillance: Secondary | ICD-10-CM | POA: Diagnosis not present

## 2018-01-09 DIAGNOSIS — Z299 Encounter for prophylactic measures, unspecified: Secondary | ICD-10-CM | POA: Diagnosis not present

## 2018-01-09 DIAGNOSIS — I10 Essential (primary) hypertension: Secondary | ICD-10-CM | POA: Diagnosis not present

## 2018-01-14 DIAGNOSIS — M1711 Unilateral primary osteoarthritis, right knee: Secondary | ICD-10-CM | POA: Diagnosis not present

## 2018-01-18 DIAGNOSIS — I10 Essential (primary) hypertension: Secondary | ICD-10-CM | POA: Diagnosis not present

## 2018-01-18 DIAGNOSIS — F329 Major depressive disorder, single episode, unspecified: Secondary | ICD-10-CM | POA: Diagnosis not present

## 2018-01-18 DIAGNOSIS — E7800 Pure hypercholesterolemia, unspecified: Secondary | ICD-10-CM | POA: Diagnosis not present

## 2018-03-01 DIAGNOSIS — E7800 Pure hypercholesterolemia, unspecified: Secondary | ICD-10-CM | POA: Diagnosis not present

## 2018-03-01 DIAGNOSIS — F329 Major depressive disorder, single episode, unspecified: Secondary | ICD-10-CM | POA: Diagnosis not present

## 2018-03-01 DIAGNOSIS — I10 Essential (primary) hypertension: Secondary | ICD-10-CM | POA: Diagnosis not present

## 2018-03-19 DIAGNOSIS — E7800 Pure hypercholesterolemia, unspecified: Secondary | ICD-10-CM | POA: Diagnosis not present

## 2018-03-19 DIAGNOSIS — I10 Essential (primary) hypertension: Secondary | ICD-10-CM | POA: Diagnosis not present

## 2018-03-19 DIAGNOSIS — F329 Major depressive disorder, single episode, unspecified: Secondary | ICD-10-CM | POA: Diagnosis not present

## 2018-03-29 DIAGNOSIS — I10 Essential (primary) hypertension: Secondary | ICD-10-CM | POA: Diagnosis not present

## 2018-03-29 DIAGNOSIS — Z6827 Body mass index (BMI) 27.0-27.9, adult: Secondary | ICD-10-CM | POA: Diagnosis not present

## 2018-03-29 DIAGNOSIS — G8929 Other chronic pain: Secondary | ICD-10-CM | POA: Diagnosis not present

## 2018-03-29 DIAGNOSIS — M545 Low back pain: Secondary | ICD-10-CM | POA: Diagnosis not present

## 2018-04-02 DIAGNOSIS — M2560 Stiffness of unspecified joint, not elsewhere classified: Secondary | ICD-10-CM | POA: Diagnosis not present

## 2018-04-02 DIAGNOSIS — M5489 Other dorsalgia: Secondary | ICD-10-CM | POA: Diagnosis not present

## 2018-04-02 DIAGNOSIS — M545 Low back pain: Secondary | ICD-10-CM | POA: Diagnosis not present

## 2018-04-02 DIAGNOSIS — M6281 Muscle weakness (generalized): Secondary | ICD-10-CM | POA: Diagnosis not present

## 2018-04-04 DIAGNOSIS — M545 Low back pain: Secondary | ICD-10-CM | POA: Diagnosis not present

## 2018-04-04 DIAGNOSIS — M5489 Other dorsalgia: Secondary | ICD-10-CM | POA: Diagnosis not present

## 2018-04-04 DIAGNOSIS — M6281 Muscle weakness (generalized): Secondary | ICD-10-CM | POA: Diagnosis not present

## 2018-04-04 DIAGNOSIS — M2560 Stiffness of unspecified joint, not elsewhere classified: Secondary | ICD-10-CM | POA: Diagnosis not present

## 2018-04-08 DIAGNOSIS — M2560 Stiffness of unspecified joint, not elsewhere classified: Secondary | ICD-10-CM | POA: Diagnosis not present

## 2018-04-08 DIAGNOSIS — M545 Low back pain: Secondary | ICD-10-CM | POA: Diagnosis not present

## 2018-04-08 DIAGNOSIS — M6281 Muscle weakness (generalized): Secondary | ICD-10-CM | POA: Diagnosis not present

## 2018-04-08 DIAGNOSIS — M5489 Other dorsalgia: Secondary | ICD-10-CM | POA: Diagnosis not present

## 2018-04-11 DIAGNOSIS — I2699 Other pulmonary embolism without acute cor pulmonale: Secondary | ICD-10-CM | POA: Diagnosis not present

## 2018-04-11 DIAGNOSIS — Z299 Encounter for prophylactic measures, unspecified: Secondary | ICD-10-CM | POA: Diagnosis not present

## 2018-04-11 DIAGNOSIS — M545 Low back pain: Secondary | ICD-10-CM | POA: Diagnosis not present

## 2018-04-11 DIAGNOSIS — I10 Essential (primary) hypertension: Secondary | ICD-10-CM | POA: Diagnosis not present

## 2018-04-11 DIAGNOSIS — G4730 Sleep apnea, unspecified: Secondary | ICD-10-CM | POA: Diagnosis not present

## 2018-04-11 DIAGNOSIS — M6281 Muscle weakness (generalized): Secondary | ICD-10-CM | POA: Diagnosis not present

## 2018-04-11 DIAGNOSIS — Z6830 Body mass index (BMI) 30.0-30.9, adult: Secondary | ICD-10-CM | POA: Diagnosis not present

## 2018-04-11 DIAGNOSIS — M5489 Other dorsalgia: Secondary | ICD-10-CM | POA: Diagnosis not present

## 2018-04-11 DIAGNOSIS — M2560 Stiffness of unspecified joint, not elsewhere classified: Secondary | ICD-10-CM | POA: Diagnosis not present

## 2018-04-15 DIAGNOSIS — M5489 Other dorsalgia: Secondary | ICD-10-CM | POA: Diagnosis not present

## 2018-04-15 DIAGNOSIS — M6281 Muscle weakness (generalized): Secondary | ICD-10-CM | POA: Diagnosis not present

## 2018-04-15 DIAGNOSIS — M2560 Stiffness of unspecified joint, not elsewhere classified: Secondary | ICD-10-CM | POA: Diagnosis not present

## 2018-04-15 DIAGNOSIS — M545 Low back pain: Secondary | ICD-10-CM | POA: Diagnosis not present

## 2018-04-17 DIAGNOSIS — M2560 Stiffness of unspecified joint, not elsewhere classified: Secondary | ICD-10-CM | POA: Diagnosis not present

## 2018-04-17 DIAGNOSIS — M5489 Other dorsalgia: Secondary | ICD-10-CM | POA: Diagnosis not present

## 2018-04-17 DIAGNOSIS — M6281 Muscle weakness (generalized): Secondary | ICD-10-CM | POA: Diagnosis not present

## 2018-04-17 DIAGNOSIS — M545 Low back pain: Secondary | ICD-10-CM | POA: Diagnosis not present

## 2018-04-22 DIAGNOSIS — M5489 Other dorsalgia: Secondary | ICD-10-CM | POA: Diagnosis not present

## 2018-04-22 DIAGNOSIS — M2560 Stiffness of unspecified joint, not elsewhere classified: Secondary | ICD-10-CM | POA: Diagnosis not present

## 2018-04-22 DIAGNOSIS — M545 Low back pain: Secondary | ICD-10-CM | POA: Diagnosis not present

## 2018-04-22 DIAGNOSIS — M6281 Muscle weakness (generalized): Secondary | ICD-10-CM | POA: Diagnosis not present

## 2018-04-25 DIAGNOSIS — M545 Low back pain: Secondary | ICD-10-CM | POA: Diagnosis not present

## 2018-04-25 DIAGNOSIS — M6281 Muscle weakness (generalized): Secondary | ICD-10-CM | POA: Diagnosis not present

## 2018-04-25 DIAGNOSIS — Z6829 Body mass index (BMI) 29.0-29.9, adult: Secondary | ICD-10-CM | POA: Diagnosis not present

## 2018-04-25 DIAGNOSIS — E7800 Pure hypercholesterolemia, unspecified: Secondary | ICD-10-CM | POA: Diagnosis not present

## 2018-04-25 DIAGNOSIS — G4730 Sleep apnea, unspecified: Secondary | ICD-10-CM | POA: Diagnosis not present

## 2018-04-25 DIAGNOSIS — I10 Essential (primary) hypertension: Secondary | ICD-10-CM | POA: Diagnosis not present

## 2018-04-25 DIAGNOSIS — M2560 Stiffness of unspecified joint, not elsewhere classified: Secondary | ICD-10-CM | POA: Diagnosis not present

## 2018-04-25 DIAGNOSIS — G4700 Insomnia, unspecified: Secondary | ICD-10-CM | POA: Diagnosis not present

## 2018-04-25 DIAGNOSIS — M5489 Other dorsalgia: Secondary | ICD-10-CM | POA: Diagnosis not present

## 2018-04-25 DIAGNOSIS — Z299 Encounter for prophylactic measures, unspecified: Secondary | ICD-10-CM | POA: Diagnosis not present

## 2018-04-29 DIAGNOSIS — M545 Low back pain: Secondary | ICD-10-CM | POA: Diagnosis not present

## 2018-04-29 DIAGNOSIS — M6281 Muscle weakness (generalized): Secondary | ICD-10-CM | POA: Diagnosis not present

## 2018-04-29 DIAGNOSIS — M2560 Stiffness of unspecified joint, not elsewhere classified: Secondary | ICD-10-CM | POA: Diagnosis not present

## 2018-04-29 DIAGNOSIS — M5489 Other dorsalgia: Secondary | ICD-10-CM | POA: Diagnosis not present

## 2018-05-02 DIAGNOSIS — M545 Low back pain: Secondary | ICD-10-CM | POA: Diagnosis not present

## 2018-05-02 DIAGNOSIS — M2560 Stiffness of unspecified joint, not elsewhere classified: Secondary | ICD-10-CM | POA: Diagnosis not present

## 2018-05-02 DIAGNOSIS — M6281 Muscle weakness (generalized): Secondary | ICD-10-CM | POA: Diagnosis not present

## 2018-05-02 DIAGNOSIS — M5489 Other dorsalgia: Secondary | ICD-10-CM | POA: Diagnosis not present

## 2018-05-06 DIAGNOSIS — M2560 Stiffness of unspecified joint, not elsewhere classified: Secondary | ICD-10-CM | POA: Diagnosis not present

## 2018-05-06 DIAGNOSIS — M6281 Muscle weakness (generalized): Secondary | ICD-10-CM | POA: Diagnosis not present

## 2018-05-06 DIAGNOSIS — M545 Low back pain: Secondary | ICD-10-CM | POA: Diagnosis not present

## 2018-05-06 DIAGNOSIS — M5489 Other dorsalgia: Secondary | ICD-10-CM | POA: Diagnosis not present

## 2018-05-08 DIAGNOSIS — M2560 Stiffness of unspecified joint, not elsewhere classified: Secondary | ICD-10-CM | POA: Diagnosis not present

## 2018-05-08 DIAGNOSIS — M545 Low back pain: Secondary | ICD-10-CM | POA: Diagnosis not present

## 2018-05-08 DIAGNOSIS — M5489 Other dorsalgia: Secondary | ICD-10-CM | POA: Diagnosis not present

## 2018-05-08 DIAGNOSIS — M6281 Muscle weakness (generalized): Secondary | ICD-10-CM | POA: Diagnosis not present

## 2018-05-13 ENCOUNTER — Other Ambulatory Visit: Payer: Self-pay | Admitting: Pediatrics

## 2018-05-13 DIAGNOSIS — I10 Essential (primary) hypertension: Secondary | ICD-10-CM | POA: Diagnosis not present

## 2018-05-13 DIAGNOSIS — M545 Low back pain: Secondary | ICD-10-CM | POA: Diagnosis not present

## 2018-05-13 DIAGNOSIS — M5489 Other dorsalgia: Secondary | ICD-10-CM | POA: Diagnosis not present

## 2018-05-13 DIAGNOSIS — M2560 Stiffness of unspecified joint, not elsewhere classified: Secondary | ICD-10-CM | POA: Diagnosis not present

## 2018-05-13 DIAGNOSIS — E7800 Pure hypercholesterolemia, unspecified: Secondary | ICD-10-CM | POA: Diagnosis not present

## 2018-05-13 DIAGNOSIS — M6281 Muscle weakness (generalized): Secondary | ICD-10-CM | POA: Diagnosis not present

## 2018-05-13 DIAGNOSIS — F329 Major depressive disorder, single episode, unspecified: Secondary | ICD-10-CM | POA: Diagnosis not present

## 2018-06-10 DIAGNOSIS — E559 Vitamin D deficiency, unspecified: Secondary | ICD-10-CM | POA: Diagnosis not present

## 2018-06-10 DIAGNOSIS — M2550 Pain in unspecified joint: Secondary | ICD-10-CM | POA: Diagnosis not present

## 2018-06-10 DIAGNOSIS — E7800 Pure hypercholesterolemia, unspecified: Secondary | ICD-10-CM | POA: Diagnosis not present

## 2018-06-10 DIAGNOSIS — M1711 Unilateral primary osteoarthritis, right knee: Secondary | ICD-10-CM | POA: Diagnosis not present

## 2018-06-10 DIAGNOSIS — F329 Major depressive disorder, single episode, unspecified: Secondary | ICD-10-CM | POA: Diagnosis not present

## 2018-06-10 DIAGNOSIS — I10 Essential (primary) hypertension: Secondary | ICD-10-CM | POA: Diagnosis not present

## 2018-06-10 DIAGNOSIS — R5383 Other fatigue: Secondary | ICD-10-CM | POA: Diagnosis not present

## 2018-07-08 DIAGNOSIS — I7300 Raynaud's syndrome without gangrene: Secondary | ICD-10-CM | POA: Diagnosis not present

## 2018-07-08 DIAGNOSIS — M791 Myalgia, unspecified site: Secondary | ICD-10-CM | POA: Diagnosis not present

## 2018-07-08 DIAGNOSIS — M2550 Pain in unspecified joint: Secondary | ICD-10-CM | POA: Diagnosis not present

## 2018-07-08 DIAGNOSIS — R768 Other specified abnormal immunological findings in serum: Secondary | ICD-10-CM | POA: Diagnosis not present

## 2018-07-08 DIAGNOSIS — M3500 Sicca syndrome, unspecified: Secondary | ICD-10-CM | POA: Diagnosis not present

## 2018-07-08 DIAGNOSIS — E663 Overweight: Secondary | ICD-10-CM | POA: Diagnosis not present

## 2018-07-08 DIAGNOSIS — Z6827 Body mass index (BMI) 27.0-27.9, adult: Secondary | ICD-10-CM | POA: Diagnosis not present

## 2018-07-08 DIAGNOSIS — R5383 Other fatigue: Secondary | ICD-10-CM | POA: Diagnosis not present

## 2018-07-10 ENCOUNTER — Ambulatory Visit (INDEPENDENT_AMBULATORY_CARE_PROVIDER_SITE_OTHER): Payer: Medicare Other | Admitting: Cardiology

## 2018-07-10 ENCOUNTER — Encounter: Payer: Self-pay | Admitting: Cardiology

## 2018-07-10 VITALS — BP 136/78 | HR 87 | Ht 63.0 in | Wt 152.8 lb

## 2018-07-10 DIAGNOSIS — R001 Bradycardia, unspecified: Secondary | ICD-10-CM | POA: Diagnosis not present

## 2018-07-10 DIAGNOSIS — R0602 Shortness of breath: Secondary | ICD-10-CM

## 2018-07-10 DIAGNOSIS — Z01810 Encounter for preprocedural cardiovascular examination: Secondary | ICD-10-CM | POA: Diagnosis not present

## 2018-07-10 NOTE — Progress Notes (Signed)
Clinical Summary Susan Hicks is a 72 y.o.female seen today for follow up of the following medical problems.   1. SOB - about 1 month ago sudden breathlessness. Occurred while at home at rest. No chest pain. Felt weak all over. Layed down and checked pulse, felt irregular. Checked by her family that is a paramedic, checked and also irregular, did rhythm strip at the time and she was told she had PACs.  - symptosm about 1-2 hours - no recurrent episodes. No otherwise palpitations - does heavy hardwork, tolerates well   - last episode 1 month ago. Very hot that day. HR up to 120s, lasted about 10 minutes. Felt drained, mild lightheadedness.  - similar episode from a year ago when I first saw her in clinic. Only 2 total episodes over the last year.  - no EtoH. Drinks half caf coffee morning. Decaffeinated tea and sodas.    2. Sinus bradycardia - seen by EP 03/2016. Bradycardia resolved off metoprolol - no recent symptoms.    3. Preoperative evaluation - being considered for knee surgery - highest level of activity is housework and yardwork. Can walk up 2 flight of stairs without troubles.   Past Medical History:  Diagnosis Date  . Fibromyalgia   . GERD (gastroesophageal reflux disease)   . Hypertension   . Irritable bowel   . Osteoporosis      Allergies  Allergen Reactions  . Beta Adrenergic Blockers Other (See Comments)    Bottomed out heart rate      Current Outpatient Medications  Medication Sig Dispense Refill  . acetaminophen (TYLENOL) 500 MG tablet Take 750 mg by mouth at bedtime.    Marland Kitchen aspirin 81 MG chewable tablet Chew 1 tablet (81 mg total) by mouth daily. 30 tablet 6  . B Complex-C (SUPER B COMPLEX PO) Take 1 tablet by mouth daily.    . busPIRone (BUSPAR) 15 MG tablet Take 15 mg by mouth 2 (two) times daily.    . cetirizine (ZYRTEC) 10 MG tablet Take 10 mg by mouth daily.    Marland Kitchen LORazepam (ATIVAN) 0.5 MG tablet Take 0.5 mg by mouth at bedtime.    .  Magnesium-Zinc 133.33-5 MG TABS Take 1 tablet by mouth at bedtime.    . Melatonin 5 MG TABS Take 5 mg by mouth at bedtime.    . meloxicam (MOBIC) 7.5 MG tablet Take 7.5 mg by mouth 2 (two) times daily.    Marland Kitchen olmesartan (BENICAR) 20 MG tablet Take 20 mg by mouth daily.    . Omega-3 Fatty Acids (FISH OIL) 1200 MG CAPS Take 1,200 mg by mouth daily.    Marland Kitchen OVER THE COUNTER MEDICATION Take 1 scoop by mouth daily. Vital Reds concentrated polyphenol blend - dietary supplement - mix 1 scoop in liquid and drink    . OVER THE COUNTER MEDICATION Place 1 drop into both eyes 3 (three) times daily. Over the counter lubricating eye drop    . oxybutynin (DITROPAN) 5 MG tablet Take 5 mg by mouth daily.    . pantoprazole (PROTONIX) 40 MG tablet Take 40 mg by mouth daily.    Marland Kitchen PRESCRIPTION MEDICATION Inhale into the lungs at bedtime. CPAP    . Psyllium (METAMUCIL PO) Take 10 mLs by mouth daily. Mix 2 teaspoonsful (10 mls) in liquid and drink    . sertraline (ZOLOFT) 100 MG tablet Take 150 mg by mouth daily.    . traMADol (ULTRAM) 50 MG tablet Take 50 mg by  mouth See admin instructions. Take 1 tablet (50 mg) by mouth every morning, may also take 1 tablet (50 mg) in the evening as needed for fibromyalgia pain    . Turmeric Curcumin 500 MG CAPS Take 500 mg by mouth at bedtime.     No current facility-administered medications for this visit.      Past Surgical History:  Procedure Laterality Date  . BACK SURGERY    . CHOLECYSTECTOMY       Allergies  Allergen Reactions  . Beta Adrenergic Blockers Other (See Comments)    Bottomed out heart rate       Family History  Problem Relation Age of Onset  . Heart attack Father   . Pulmonary disease Father   . Hypertension Father   . Thyroid disease Mother   . Hypertension Mother   . Thyroid disease Sister   . Hypertension Sister   . Hypertension Daughter   . Hypertension Son      Social History Susan Hicks reports that she has never smoked. She has never  used smokeless tobacco. Susan Hicks reports that she does not drink alcohol.   Review of Systems CONSTITUTIONAL: No weight loss, fever, chills, weakness or fatigue.  HEENT: Eyes: No visual loss, blurred vision, double vision or yellow sclerae.No hearing loss, sneezing, congestion, runny nose or sore throat.  SKIN: No rash or itching.  CARDIOVASCULAR: per hpi RESPIRATORY: per hpi  GASTROINTESTINAL: No anorexia, nausea, vomiting or diarrhea. No abdominal pain or blood.  GENITOURINARY: No burning on urination, no polyuria NEUROLOGICAL: No headache, dizziness, syncope, paralysis, ataxia, numbness or tingling in the extremities. No change in bowel or bladder control.  MUSCULOSKELETAL: No muscle, back pain, joint pain or stiffness.  LYMPHATICS: No enlarged nodes. No history of splenectomy.  PSYCHIATRIC: No history of depression or anxiety.  ENDOCRINOLOGIC: No reports of sweating, cold or heat intolerance. No polyuria or polydipsia.  Marland Kitchen   Physical Examination Vitals:   07/10/18 1302  BP: 136/78  Pulse: 87  SpO2: 95%   Vitals:   07/10/18 1302  Weight: 152 lb 12.8 oz (69.3 kg)  Height: 5\' 3"  (1.6 m)    Gen: resting comfortably, no acute distress HEENT: no scleral icterus, pupils equal round and reactive, no palptable cervical adenopathy,  CV: RRR, no m/r/g, no jvd Resp: Clear to auscultation bilaterally GI: abdomen is soft, non-tender, non-distended, normal bowel sounds, no hepatosplenomegaly MSK: extremities are warm, no edema.  Skin: warm, no rash Neuro:  no focal deficits Psych: appropriate affect    Assessment and Plan   1. SOB - rate infrequent episodes, about twice a year. Most suggestive of possible transient arrhythmia. - EKG today shows NSR - episodes very infrequent, low yield in obtaining event monitor at this time. If symptoms increase in frequency consider at that time.   2. Preoperative evaluation - tolerates greater than 4METs without limitation - recommend  proceeding with knee replacement as planned.   3. Bradycardia - no recurrence off beta blockers, continue to monitor.    F/u 1 year  Arnoldo Lenis, M.D.

## 2018-07-10 NOTE — Patient Instructions (Addendum)
Medication Instructions:   Your physician recommends that you continue on your current medications as directed. Please refer to the Current Medication list given to you today.  Labwork:  NONE  Testing/Procedures:  NONE  Follow-Up:  Your physician recommends that you schedule a follow-up appointment in: 1 year. You will receive a reminder letter in the mail in about 10 months reminding you to call and schedule your appointment. If you don't receive this letter, please contact our office.  Any Other Special Instructions Will Be Listed Below (If Applicable).  Your surgical request will be faxed to [facility]  If you need a refill on your cardiac medications before your next appointment, please call your pharmacy.

## 2018-07-18 DIAGNOSIS — Z79899 Other long term (current) drug therapy: Secondary | ICD-10-CM | POA: Diagnosis not present

## 2018-07-18 DIAGNOSIS — Z6829 Body mass index (BMI) 29.0-29.9, adult: Secondary | ICD-10-CM | POA: Diagnosis not present

## 2018-07-18 DIAGNOSIS — G4700 Insomnia, unspecified: Secondary | ICD-10-CM | POA: Diagnosis not present

## 2018-07-18 DIAGNOSIS — Z23 Encounter for immunization: Secondary | ICD-10-CM | POA: Diagnosis not present

## 2018-07-18 DIAGNOSIS — M797 Fibromyalgia: Secondary | ICD-10-CM | POA: Diagnosis not present

## 2018-07-18 DIAGNOSIS — I10 Essential (primary) hypertension: Secondary | ICD-10-CM | POA: Diagnosis not present

## 2018-07-18 DIAGNOSIS — Z299 Encounter for prophylactic measures, unspecified: Secondary | ICD-10-CM | POA: Diagnosis not present

## 2018-07-30 DIAGNOSIS — M1711 Unilateral primary osteoarthritis, right knee: Secondary | ICD-10-CM | POA: Diagnosis not present

## 2018-08-07 DIAGNOSIS — Z01818 Encounter for other preprocedural examination: Secondary | ICD-10-CM | POA: Diagnosis not present

## 2018-08-07 DIAGNOSIS — Z6830 Body mass index (BMI) 30.0-30.9, adult: Secondary | ICD-10-CM | POA: Diagnosis not present

## 2018-08-07 DIAGNOSIS — G4730 Sleep apnea, unspecified: Secondary | ICD-10-CM | POA: Diagnosis not present

## 2018-08-07 DIAGNOSIS — I10 Essential (primary) hypertension: Secondary | ICD-10-CM | POA: Diagnosis not present

## 2018-08-07 DIAGNOSIS — Z299 Encounter for prophylactic measures, unspecified: Secondary | ICD-10-CM | POA: Diagnosis not present

## 2018-08-14 DIAGNOSIS — E663 Overweight: Secondary | ICD-10-CM | POA: Diagnosis not present

## 2018-08-14 DIAGNOSIS — M351 Other overlap syndromes: Secondary | ICD-10-CM | POA: Diagnosis not present

## 2018-08-14 DIAGNOSIS — I7300 Raynaud's syndrome without gangrene: Secondary | ICD-10-CM | POA: Diagnosis not present

## 2018-08-14 DIAGNOSIS — M797 Fibromyalgia: Secondary | ICD-10-CM | POA: Diagnosis not present

## 2018-08-14 DIAGNOSIS — M2550 Pain in unspecified joint: Secondary | ICD-10-CM | POA: Diagnosis not present

## 2018-08-14 DIAGNOSIS — R768 Other specified abnormal immunological findings in serum: Secondary | ICD-10-CM | POA: Diagnosis not present

## 2018-08-14 DIAGNOSIS — M3500 Sicca syndrome, unspecified: Secondary | ICD-10-CM | POA: Diagnosis not present

## 2018-08-14 DIAGNOSIS — M150 Primary generalized (osteo)arthritis: Secondary | ICD-10-CM | POA: Diagnosis not present

## 2018-08-14 DIAGNOSIS — Z6827 Body mass index (BMI) 27.0-27.9, adult: Secondary | ICD-10-CM | POA: Diagnosis not present

## 2018-08-15 DIAGNOSIS — F329 Major depressive disorder, single episode, unspecified: Secondary | ICD-10-CM | POA: Diagnosis not present

## 2018-08-15 DIAGNOSIS — I10 Essential (primary) hypertension: Secondary | ICD-10-CM | POA: Diagnosis not present

## 2018-08-15 DIAGNOSIS — E7800 Pure hypercholesterolemia, unspecified: Secondary | ICD-10-CM | POA: Diagnosis not present

## 2018-08-21 ENCOUNTER — Encounter: Payer: Self-pay | Admitting: Physician Assistant

## 2018-08-21 DIAGNOSIS — M797 Fibromyalgia: Secondary | ICD-10-CM | POA: Diagnosis present

## 2018-08-21 DIAGNOSIS — K219 Gastro-esophageal reflux disease without esophagitis: Secondary | ICD-10-CM | POA: Diagnosis present

## 2018-08-21 DIAGNOSIS — I10 Essential (primary) hypertension: Secondary | ICD-10-CM | POA: Diagnosis present

## 2018-08-21 DIAGNOSIS — G4733 Obstructive sleep apnea (adult) (pediatric): Secondary | ICD-10-CM

## 2018-08-21 DIAGNOSIS — Z9989 Dependence on other enabling machines and devices: Secondary | ICD-10-CM

## 2018-08-21 DIAGNOSIS — K589 Irritable bowel syndrome without diarrhea: Secondary | ICD-10-CM | POA: Diagnosis present

## 2018-08-21 DIAGNOSIS — M1711 Unilateral primary osteoarthritis, right knee: Secondary | ICD-10-CM | POA: Diagnosis present

## 2018-08-21 DIAGNOSIS — F411 Generalized anxiety disorder: Secondary | ICD-10-CM

## 2018-08-21 HISTORY — DX: Obstructive sleep apnea (adult) (pediatric): G47.33

## 2018-08-21 HISTORY — DX: Generalized anxiety disorder: F41.1

## 2018-08-21 NOTE — H&P (Signed)
TOTAL KNEE ADMISSION H&P  Patient is being admitted for right total knee arthroplasty.  Subjective:  Chief Complaint:right knee pain.  HPI: Susan Hicks, 72 y.o. female, has a history of pain and functional disability in the right knee due to arthritis and has failed non-surgical conservative treatments for greater than 12 weeks to includeNSAID's and/or analgesics, corticosteriod injections, viscosupplementation injections, flexibility and strengthening excercises, use of assistive devices, weight reduction as appropriate and activity modification.  Onset of symptoms was gradual, starting 10 years ago with gradually worsening course since that time. The patient noted no past surgery on the right knee(s).  Patient currently rates pain in the right knee(s) at 10 out of 10 with activity. Patient has night pain, worsening of pain with activity and weight bearing, pain that interferes with activities of daily living, crepitus and joint swelling.  Patient has evidence of subchondral sclerosis, periarticular osteophytes and joint space narrowing by imaging studies.  There is no active infection.  Patient Active Problem List   Diagnosis Date Noted  . Generalized anxiety disorder 08/21/2018  . Obstructive sleep apnea treated with continuous positive airway pressure (CPAP) 08/21/2018  . Irritable bowel   . Hypertension   . GERD (gastroesophageal reflux disease)   . Fibromyalgia   . Primary localized osteoarthritis of right knee   . Bradycardia 04/13/2016   Past Medical History:  Diagnosis Date  . Fibromyalgia   . Generalized anxiety disorder 08/21/2018  . GERD (gastroesophageal reflux disease)   . Hypertension   . Irritable bowel   . Obstructive sleep apnea treated with continuous positive airway pressure (CPAP) 08/21/2018  . Osteoporosis   . Primary localized osteoarthritis of right knee     Past Surgical History:  Procedure Laterality Date  . BACK SURGERY    . CHOLECYSTECTOMY       No current facility-administered medications for this encounter.    Current Outpatient Medications  Medication Sig Dispense Refill Last Dose  . aspirin EC 81 MG tablet Take 81 mg by mouth daily.   08/21/2018 at Unknown time  . B Complex-C (SUPER B COMPLEX PO) Take 1 tablet by mouth daily.   08/21/2018 at Unknown time  . busPIRone (BUSPAR) 15 MG tablet Take 15 mg by mouth 2 (two) times daily.   08/21/2018 at Unknown time  . Ca Phosphate-Cholecalciferol (CALCIUM/VITAMIN D3 GUMMIES PO) Take 1 each by mouth 2 (two) times daily.   08/21/2018 at Unknown time  . carboxymethylcellulose (REFRESH PLUS) 0.5 % SOLN Place 1 drop into both eyes 3 (three) times daily.   08/21/2018 at Unknown time  . cetirizine (ZYRTEC) 10 MG tablet Take 10 mg by mouth daily.   08/21/2018 at Unknown time  . Cholecalciferol (VITAMIN D3) 2000 units TABS Take 2,000 Units by mouth daily.    08/21/2018 at Unknown time  . hydroxychloroquine (PLAQUENIL) 200 MG tablet Take 200 mg by mouth 2 (two) times daily.  5 08/21/2018 at Unknown time  . LORazepam (ATIVAN) 0.5 MG tablet Take 0.5 mg by mouth at bedtime.   08/20/2018 at Unknown time  . Magnesium 250 MG TABS Take 250 mg by mouth at bedtime.   08/21/2018 at Unknown time  . Melatonin 5 MG TABS Take 5 mg by mouth at bedtime.   08/21/2018 at Unknown time  . meloxicam (MOBIC) 7.5 MG tablet Take 7.5 mg by mouth 2 (two) times daily.   08/21/2018 at Unknown time  . Multiple Vitamin (MULTIVITAMIN) tablet Take 1 tablet by mouth daily.   08/21/2018 at  Unknown time  . Omega-3 Fatty Acids (FISH OIL) 500 MG CAPS Take 500 mg by mouth daily.   08/21/2018 at Unknown time  . OVER THE COUNTER MEDICATION Take 1 scoop by mouth daily. Vital Reds concentrated polyphenol blend - dietary supplement - mix 1 scoop in liquid and drink   08/21/2018 at Unknown time  . oxybutynin (DITROPAN) 5 MG tablet Take 5 mg by mouth daily.   08/21/2018 at Unknown time  . pantoprazole (PROTONIX) 40 MG tablet Take 40 mg by mouth daily.    08/21/2018 at Unknown time  . PRESCRIPTION MEDICATION Inhale into the lungs at bedtime. CPAP   08/21/2018 at Unknown time  . Psyllium (METAMUCIL PO) Take 10 mLs by mouth daily. Mix 2 teaspoonsful (10 mls) in liquid and drink   08/21/2018 at Unknown time  . sertraline (ZOLOFT) 100 MG tablet Take 150 mg by mouth daily.   08/21/2018 at Unknown time  . traMADol (ULTRAM) 50 MG tablet Take 50 mg by mouth See admin instructions. Take 1 tablet (50 mg) by mouth every morning, may also take 1 tablet (50 mg) in the evening as needed for fibromyalgia pain   08/21/2018 at Unknown time  . Turmeric Curcumin 500 MG CAPS Take 500 mg by mouth at bedtime.   08/20/2018 at Unknown time  . valsartan-hydrochlorothiazide (DIOVAN-HCT) 160-25 MG tablet Take 1 tablet by mouth daily.   08/21/2018 at Unknown time  . aspirin 81 MG chewable tablet Chew 1 tablet (81 mg total) by mouth daily. (Patient not taking: Reported on 08/21/2018) 30 tablet 6 Not Taking at Unknown time   Allergies  Allergen Reactions  . Beta Adrenergic Blockers Other (See Comments)    Bradycardia - bottomed out heart rate   . Lisinopril Cough  . Norvasc [Amlodipine] Other (See Comments)    Headache    Social History   Tobacco Use  . Smoking status: Never Smoker  . Smokeless tobacco: Never Used  Substance Use Topics  . Alcohol use: No    Alcohol/week: 0.0 standard drinks    Family History  Problem Relation Age of Onset  . Heart attack Father   . Pulmonary disease Father   . Hypertension Father   . Thyroid disease Mother   . Hypertension Mother   . Thyroid disease Sister   . Hypertension Sister   . Hypertension Daughter   . Hypertension Son      Review of Systems  Constitutional: Negative.   HENT: Negative.   Eyes: Negative.   Respiratory: Negative.   Cardiovascular: Negative.   Gastrointestinal: Negative.   Genitourinary: Negative.   Musculoskeletal: Positive for back pain and joint pain.  Skin: Negative.   Neurological: Negative.    Endo/Heme/Allergies: Negative.   Psychiatric/Behavioral: Negative.     Objective:  Physical Exam  Constitutional: She is oriented to person, place, and time. She appears well-developed and well-nourished.  HENT:  Head: Normocephalic and atraumatic.  Mouth/Throat: Oropharynx is clear and moist.  Eyes: Pupils are equal, round, and reactive to light. Conjunctivae are normal.  Neck: Neck supple.  Cardiovascular: Normal rate and regular rhythm.  Respiratory: Breath sounds normal.  GI: Bowel sounds are normal.  Genitourinary:  Genitourinary Comments: Not pertinent to current symptomatology therefore not examined.  Musculoskeletal:  Examination of her right knee reveals pain medially and laterally.  Mild valgus deformity.  1+ synovitis.  Range of motion -10-120 degrees.  Knee is stable with normal patella tracking.  Examination of her left knee reveals minimal pain.  Full range  of motion.  Knee is stable with normal patella tracking.    Neurological: She is alert and oriented to person, place, and time.  Skin: Skin is warm and dry.  Psychiatric: She has a normal mood and affect. Her behavior is normal.    Vital signs in last 24 hours: Temp:  [97.8 F (36.6 C)] 97.8 F (36.6 C) (11/06 1500) Pulse Rate:  [78] 78 (11/06 1500) BP: (152)/(90) 152/90 (11/06 1500) Weight:  [70.6 kg] 70.6 kg (11/06 1500)  Labs:   Estimated body mass index is 27.56 kg/m as calculated from the following:   Height as of this encounter: 5\' 3"  (1.6 m).   Weight as of this encounter: 70.6 kg.   Imaging Review Plain radiographs demonstrate severe degenerative joint disease of the right knee(s). The overall alignment issignificant valgus. The bone quality appears to be good for age and reported activity level.   Preoperative templating of the joint replacement has been completed, documented, and submitted to the Operating Room personnel in order to optimize intra-operative equipment  management.   Anticipated LOS equal to or greater than 2 midnights due to - Age 65 and older with one or more of the following:  - Obesity  - Expected need for hospital services (PT, OT, Nursing) required for safe  discharge  - Anticipated need for postoperative skilled nursing care or inpatient rehab  - Active co-morbidities: None OR   - Unanticipated findings during/Post Surgery: None  - Patient is a high risk of re-admission due to: None     Assessment/Plan:  End stage arthritis, right knee   The patient history, physical examination, clinical judgment of the provider and imaging studies are consistent with end stage degenerative joint disease of the right knee(s) and total knee arthroplasty is deemed medically necessary. The treatment options including medical management, injection therapy arthroscopy and arthroplasty were discussed at length. The risks and benefits of total knee arthroplasty were presented and reviewed. The risks due to aseptic loosening, infection, stiffness, patella tracking problems, thromboembolic complications and other imponderables were discussed. The patient acknowledged the explanation, agreed to proceed with the plan and consent was signed. Patient is being admitted for inpatient treatment for surgery, pain control, PT, OT, prophylactic antibiotics, VTE prophylaxis, progressive ambulation and ADL's and discharge planning. The patient is planning to be discharged home with home health services

## 2018-08-22 NOTE — Pre-Procedure Instructions (Signed)
Susan Hicks  08/22/2018      [facility] - [address], [place], [place] [address] [place] [address] Phone: [phone] Fax: [phone]    Your procedure is scheduled on Monday November 18.  Report to [facility] Admitting at 7:45 A.M.  Call this number if you have problems the morning of surgery:  [phone]   Remember:  Do not eat or drink after midnight.    Take these medicines the morning of surgery with A SIP OF WATER:   Buspirone (Buspar) Cetirizine (Zyrtec) Hydroxychloroquine (Plaquenil) Oxybutynin (Ditropan) Pantoprazole (protonix) Sertraline (Zoloft) Tramadol (Ultram) if needed  7 days prior to surgery STOP taking any Meloxicam (Mobic), Aspirin(unless otherwise instructed by your surgeon), Aleve, Naproxen, Ibuprofen, Motrin, Advil, Goody's, BC's, all herbal medications, fish oil, and all vitamins      Do not wear jewelry, make-up or nail polish.  Do not wear lotions, powders, or perfumes, or deodorant.  Do not shave 48 hours prior to surgery.  Men may shave face and neck.  Do not bring valuables to the hospital.  [facility] is not responsible for any belongings or valuables.  Contacts, dentures or bridgework may not be worn into surgery.  Leave your suitcase in the car.  After surgery it may be brought to your room.  For patients admitted to the hospital, discharge time will be determined by your treatment team.  Patients discharged the day of surgery will not be allowed to drive home.    Special instructions:    [place] [facility]- Preparing For Surgery  Before surgery, you can play an important role. Because skin is not sterile, your skin needs to be as free of germs as possible. You can reduce the number of germs on your skin by washing with CHG (chlorahexidine gluconate) Soap before surgery.  CHG is an antiseptic cleaner which kills germs and bonds with the skin to continue killing germs even after washing.     Oral Hygiene is also important to reduce your risk of infection.  Remember - BRUSH YOUR TEETH THE MORNING OF SURGERY WITH YOUR REGULAR TOOTHPASTE  Please do not use if you have an allergy to CHG or antibacterial soaps. If your skin becomes reddened/irritated stop using the CHG.  Do not shave (including legs and underarms) for at least 48 hours prior to first CHG shower. It is OK to shave your face.  Please follow these instructions carefully.   1. Shower the NIGHT BEFORE SURGERY and the MORNING OF SURGERY with CHG.   2. If you chose to wash your hair, wash your hair first as usual with your normal shampoo.  3. After you shampoo, rinse your hair and body thoroughly to remove the shampoo.  4. Use CHG as you would any other liquid soap. You can apply CHG directly to the skin and wash gently with a scrungie or a clean washcloth.   5. Apply the CHG Soap to your body ONLY FROM THE NECK DOWN.  Do not use on open wounds or open sores. Avoid contact with your eyes, ears, mouth and genitals (private parts). Wash Face and genitals (private parts)  with your normal soap.  6. Wash thoroughly, paying special attention to the area where your surgery will be performed.  7. Thoroughly rinse your body with warm water from the neck down.  8. DO NOT shower/wash with your normal soap after using and rinsing off the CHG Soap.  9. Pat yourself dry with a CLEAN TOWEL.  10. Wear CLEAN PAJAMAS to bed the night before surgery, wear comfortable clothes the morning of surgery  11. Place CLEAN SHEETS on your bed the night of your first shower and DO NOT SLEEP WITH PETS.    Day of Surgery:  Do not apply any deodorants/lotions.  Please wear clean clothes to the hospital/surgery center.   Remember to brush your teeth WITH YOUR REGULAR TOOTHPASTE.    Please read over the following fact sheets that you were given. Coughing and Deep Breathing, Total Joint Packet, MRSA Information and Surgical Site Infection  Prevention

## 2018-08-23 ENCOUNTER — Inpatient Hospital Stay (HOSPITAL_COMMUNITY)
Admission: RE | Admit: 2018-08-23 | Discharge: 2018-08-23 | Disposition: A | Discharge status: Home / Self Care | Payer: Medicare Other | Source: Ambulatory Visit

## 2018-08-23 NOTE — Progress Notes (Addendum)
Called and spoke with patient.  She has been sick this morning and will be unable to make it to her PAT appointment.  Advised someone would call to reschedule for next week

## 2018-08-29 ENCOUNTER — Other Ambulatory Visit: Payer: Self-pay

## 2018-08-29 ENCOUNTER — Encounter (HOSPITAL_COMMUNITY)
Admission: RE | Admit: 2018-08-29 | Discharge: 2018-08-29 | Disposition: A | Discharge status: Home / Self Care | Payer: Medicare Other | Source: Ambulatory Visit | Attending: Orthopedic Surgery | Admitting: Orthopedic Surgery

## 2018-08-29 ENCOUNTER — Encounter (HOSPITAL_COMMUNITY): Payer: Self-pay

## 2018-08-29 DIAGNOSIS — N289 Disorder of kidney and ureter, unspecified: Secondary | ICD-10-CM | POA: Insufficient documentation

## 2018-08-29 DIAGNOSIS — Z9049 Acquired absence of other specified parts of digestive tract: Secondary | ICD-10-CM | POA: Diagnosis not present

## 2018-08-29 DIAGNOSIS — F411 Generalized anxiety disorder: Secondary | ICD-10-CM | POA: Insufficient documentation

## 2018-08-29 DIAGNOSIS — Z01818 Encounter for other preprocedural examination: Secondary | ICD-10-CM | POA: Diagnosis not present

## 2018-08-29 DIAGNOSIS — M810 Age-related osteoporosis without current pathological fracture: Secondary | ICD-10-CM | POA: Insufficient documentation

## 2018-08-29 DIAGNOSIS — K219 Gastro-esophageal reflux disease without esophagitis: Secondary | ICD-10-CM | POA: Insufficient documentation

## 2018-08-29 DIAGNOSIS — Z7982 Long term (current) use of aspirin: Secondary | ICD-10-CM | POA: Insufficient documentation

## 2018-08-29 DIAGNOSIS — M797 Fibromyalgia: Secondary | ICD-10-CM | POA: Diagnosis not present

## 2018-08-29 DIAGNOSIS — Z791 Long term (current) use of non-steroidal anti-inflammatories (NSAID): Secondary | ICD-10-CM | POA: Insufficient documentation

## 2018-08-29 DIAGNOSIS — M351 Other overlap syndromes: Secondary | ICD-10-CM | POA: Insufficient documentation

## 2018-08-29 DIAGNOSIS — M1711 Unilateral primary osteoarthritis, right knee: Secondary | ICD-10-CM | POA: Diagnosis not present

## 2018-08-29 DIAGNOSIS — I10 Essential (primary) hypertension: Secondary | ICD-10-CM | POA: Insufficient documentation

## 2018-08-29 DIAGNOSIS — Z79899 Other long term (current) drug therapy: Secondary | ICD-10-CM | POA: Insufficient documentation

## 2018-08-29 DIAGNOSIS — F329 Major depressive disorder, single episode, unspecified: Secondary | ICD-10-CM | POA: Insufficient documentation

## 2018-08-29 DIAGNOSIS — G4733 Obstructive sleep apnea (adult) (pediatric): Secondary | ICD-10-CM | POA: Insufficient documentation

## 2018-08-29 DIAGNOSIS — K589 Irritable bowel syndrome without diarrhea: Secondary | ICD-10-CM | POA: Insufficient documentation

## 2018-08-29 HISTORY — DX: Major depressive disorder, single episode, unspecified: F32.9

## 2018-08-29 HISTORY — DX: Depression, unspecified: F32.A

## 2018-08-29 HISTORY — DX: Other overlap syndromes: M35.1

## 2018-08-29 LAB — COMPREHENSIVE METABOLIC PANEL
ALT: 20 U/L (ref 0–44)
AST: 24 U/L (ref 15–41)
Albumin: 4.5 g/dL (ref 3.5–5.0)
Alkaline Phosphatase: 62 U/L (ref 38–126)
Anion gap: 11 (ref 5–15)
BUN: 26 mg/dL — ABNORMAL HIGH (ref 8–23)
CHLORIDE: 103 mmol/L (ref 98–111)
CO2: 23 mmol/L (ref 22–32)
CREATININE: 1.38 mg/dL — AB (ref 0.44–1.00)
Calcium: 9.5 mg/dL (ref 8.9–10.3)
GFR, EST AFRICAN AMERICAN: 43 mL/min — AB (ref >60)
GFR, EST NON AFRICAN AMERICAN: 37 mL/min — AB (ref >60)
Glucose, Bld: 115 mg/dL — ABNORMAL HIGH (ref 70–99)
POTASSIUM: 3.8 mmol/L (ref 3.5–5.1)
SODIUM: 137 mmol/L (ref 135–145)
Total Bilirubin: 0.7 mg/dL (ref 0.3–1.2)
Total Protein: 7.8 g/dL (ref 6.5–8.1)

## 2018-08-29 LAB — CBC WITH DIFFERENTIAL/PLATELET
ABS IMMATURE GRANULOCYTES: 0.02 10*3/uL (ref 0.00–0.07)
BASOS PCT: 0 %
Basophils Absolute: 0 10*3/uL (ref 0.0–0.1)
EOS PCT: 1 %
Eosinophils Absolute: 0.1 10*3/uL (ref 0.0–0.5)
HCT: 45.5 % (ref 36.0–46.0)
Hemoglobin: 14 g/dL (ref 12.0–15.0)
Immature Granulocytes: 0 %
Lymphocytes Relative: 32 %
Lymphs Abs: 2.2 10*3/uL (ref 0.7–4.0)
MCH: 26.3 pg (ref 26.0–34.0)
MCHC: 30.8 g/dL (ref 30.0–36.0)
MCV: 85.4 fL (ref 80.0–100.0)
MONO ABS: 0.6 10*3/uL (ref 0.1–1.0)
MONOS PCT: 8 %
NEUTROS ABS: 4 10*3/uL (ref 1.7–7.7)
Neutrophils Relative %: 59 %
PLATELETS: 348 10*3/uL (ref 150–400)
RBC: 5.33 MIL/uL — AB (ref 3.87–5.11)
RDW: 13.2 % (ref 11.5–15.5)
WBC: 7 10*3/uL (ref 4.0–10.5)
nRBC: 0 % (ref 0.0–0.2)

## 2018-08-29 LAB — TYPE AND SCREEN
ABO/RH(D): A POS
Antibody Screen: NEGATIVE

## 2018-08-29 LAB — SURGICAL PCR SCREEN
MRSA, PCR: NEGATIVE
STAPHYLOCOCCUS AUREUS: NEGATIVE

## 2018-08-29 LAB — ABO/RH: ABO/RH(D): A POS

## 2018-08-29 LAB — APTT: aPTT: 24 seconds (ref 24–36)

## 2018-08-29 LAB — PROTIME-INR
INR: 0.96
PROTHROMBIN TIME: 12.7 s (ref 11.4–15.2)

## 2018-08-29 MED ORDER — CHLORHEXIDINE GLUCONATE 4 % EX LIQD: 60.0000 mL | Freq: Once | CUTANEOUS | Status: DC

## 2018-08-29 NOTE — Progress Notes (Addendum)
PCP: Jerene Bears  Cardiologist:  Carlyle Dolly, MD  EKG: 07/10/18 in EPIC  Stress test: pt denies past 5 years  ECHO: pt denies  Cardiac Cath: pt denies  Chest x-ray: pt denies past year, no recent respiratory infections/complications  Patient on aspirin 81 mg

## 2018-08-29 NOTE — Progress Notes (Signed)
Susan Hicks            08/22/2018                          [facility] - [address], [place], [place] [address] [place] [address] Phone: [phone] Fax: [phone]              Your procedure is scheduled on Monday November 18.            Report to [facility] Admitting at 7:15 A.M.            Call this number if you have problems the morning of surgery:            [phone]             Remember:            Do not eat or drink after midnight.                        Take these medicines the morning of surgery with A SIP OF WATER:   Buspirone (Buspar) Cetirizine (Zyrtec) Hydroxychloroquine (Plaquenil) Oxybutynin (Ditropan) Pantoprazole (protonix) Sertraline (Zoloft) Tramadol (Ultram) if needed  7 days prior to surgery STOP taking any Meloxicam (Mobic), Aspirin(unless otherwise instructed by your surgeon), Aleve, Naproxen, Ibuprofen, Motrin, Advil, Goody's, BC's, all herbal medications, fish oil, and all vitamins             Do not wear jewelry, make-up or nail polish.            Do not wear lotions, powders, or perfumes, or deodorant.            Do not shave 48 hours prior to surgery.             Do not bring valuables to the hospital.            [facility] is not responsible for any belongings or valuables.  Contacts, dentures or bridgework may not be worn into surgery.  Leave your suitcase in the car.  After surgery it may be brought to your room.  For patients admitted to the hospital, discharge time will be determined by your treatment team.  Patients discharged the day of surgery will not be allowed to drive home.   [place]- Preparing For Surgery  Before surgery, you can play an important role. Because skin is not sterile, your skin needs to be as free of germs as possible. You can reduce the number of germs on your skin by washing with CHG (chlorahexidine gluconate) Soap before surgery.  CHG is an  antiseptic cleaner which kills germs and bonds with the skin to continue killing germs even after washing.    Oral Hygiene is also important to reduce your risk of infection.  Remember - BRUSH YOUR TEETH THE MORNING OF SURGERY WITH YOUR REGULAR TOOTHPASTE  Please do not use if you have an allergy to CHG or antibacterial soaps. If your skin becomes reddened/irritated stop using the CHG.  Do not shave (including legs and underarms) for at least 48 hours prior to first CHG shower. It is OK to shave your face.  Please follow these instructions carefully.  1. Shower the NIGHT BEFORE SURGERY and the MORNING OF SURGERY with CHG.   2. If you chose to wash your hair, wash your hair first as usual with your normal shampoo.  3. After you shampoo, rinse your hair and body thoroughly to remove the shampoo.  4. Use CHG as you would any other liquid soap. You can apply CHG directly to the skin and wash gently with a scrungie or a clean washcloth.   5. Apply the CHG Soap to your body ONLY FROM THE NECK DOWN.  Do not use on open wounds or open sores. Avoid contact with your eyes, ears, mouth and genitals (private parts). Wash Face and genitals (private parts)  with your normal soap.  6. Wash thoroughly, paying special attention to the area where your surgery will be performed.  7. Thoroughly rinse your body with warm water from the neck down.  8. DO NOT shower/wash with your normal soap after using and rinsing off the CHG Soap.  9. Pat yourself dry with a CLEAN TOWEL.  10. Wear CLEAN PAJAMAS to bed the night before surgery, wear comfortable clothes the morning of surgery  11. Place CLEAN SHEETS on your bed the night of your first shower and DO NOT SLEEP WITH PETS.  Day of Surgery:  Do not apply any deodorants/lotions.  Please wear clean clothes to the hospital/surgery  center.   Remember to brush your teeth WITH YOUR REGULAR TOOTHPASTE.  Please read over the following fact sheets that you were given.

## 2018-08-30 ENCOUNTER — Other Ambulatory Visit: Payer: Self-pay | Admitting: Orthopedic Surgery

## 2018-08-30 LAB — URINE CULTURE

## 2018-08-30 NOTE — Anesthesia Preprocedure Evaluation (Addendum)
Anesthesia Evaluation  Patient identified by MRN, date of birth, ID band Patient awake    Reviewed: Allergy & Precautions, NPO status , Patient's Chart, lab work & pertinent test results  Airway Mallampati: I  TM Distance: >3 FB Neck ROM: Full    Dental   Pulmonary sleep apnea ,    Pulmonary exam normal        Cardiovascular hypertension, Pt. on medications Normal cardiovascular exam     Neuro/Psych Anxiety Depression    GI/Hepatic GERD  Medicated and Controlled,  Endo/Other    Renal/GU      Musculoskeletal   Abdominal   Peds  Hematology   Anesthesia Other Findings   Reproductive/Obstetrics                            Anesthesia Physical Anesthesia Plan  ASA: II  Anesthesia Plan: Spinal   Post-op Pain Management:  Regional for Post-op pain   Induction: Intravenous  PONV Risk Score and Plan: 2 and Ondansetron and Treatment may vary due to age or medical condition  Airway Management Planned: Simple Face Mask  Additional Equipment:   Intra-op Plan:   Post-operative Plan:   Informed Consent: I have reviewed the patients History and Physical, chart, labs and discussed the procedure including the risks, benefits and alternatives for the proposed anesthesia with the patient or authorized representative who has indicated his/her understanding and acceptance.     Plan Discussed with: CRNA and Surgeon  Anesthesia Plan Comments: (See PAT note 08/29/2018 by Karoline Caldwell, PA-C )       Anesthesia Quick Evaluation

## 2018-08-30 NOTE — Care Plan (Signed)
Spoke with patient prior to surgery. She will discharge to home with family and HHPT as arranged. She has all needed equipment at home    Ladell Heads, [place]

## 2018-08-30 NOTE — Progress Notes (Signed)
Anesthesia Chart Review:  Case:  [id number] Date/Time:  09/02/18 0900   Procedure:  TOTAL KNEE ARTHROPLASTY (Right )   Anesthesia type:  Choice   Pre-op diagnosis:  djd right knee   Location:  [facility] OR ROOM 05 / [facility]   Surgeon:  Elsie Saas, MD      DISCUSSION: 72 yo female never smoker. Pertinent hx includes GERD, HTN, GAD, OSA on CPAP, Renal insufficiency, Mixed connective tissue disease, Hx of symptomatic bradycardia.  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 Resolved on holding beta blockers.  She followed up outpatient with Dr. Einar Gip after discharge and had a normal stress test, echo showing EF 68% with nl wall motion, carotid US showing minimal stenosis. He advised PRN followup.  She has since followed with Dr. Harl Bowie. Most recent OV 07/10/18 discussed knee surgery. Per his note "Preoperative evaluation. Tolerates greater than 4METs without limitation. Recommend proceeding with knee replacement as planned.   Anticipate she can proceed as planned barring acute status change.  VS: BP (!) 142/89   Pulse 91   Temp (!) 36.3 C   Resp 20   Ht 5\' 3"  (1.6 m)   Wt 67.5 kg   SpO2 98%   BMI 26.38 kg/m   PROVIDERS: Glenda Chroman, MD is PCP  Carlyle Dolly, MD is Cardiologist  LABS: Labs reviewed: Acceptable for surgery. Elevated creatinine, review of previous labs show similar, stable. (all labs ordered are listed, but only abnormal results are displayed)  Labs Reviewed  CBC WITH DIFFERENTIAL/PLATELET - Abnormal; Notable for the following components:      Result Value   RBC 5.33 (*)    All other components within normal limits  COMPREHENSIVE METABOLIC PANEL - Abnormal; Notable for the following components:   Glucose, Bld 115 (*)    BUN 26 (*)    Creatinine, Ser 1.38 (*)    GFR calc non Af Amer 37 (*)    GFR calc Af Amer 43 (*)    All other  components within normal limits  SURGICAL PCR SCREEN  URINE CULTURE  APTT  PROTIME-INR  TYPE AND SCREEN  ABO/RH    EKG: 07/10/2018: Sinus  Rhythm. Rate 70. Anterior infarct -age undetermined.   CV: Treadmill exercise stress test 05/08/2016 (copy on pt chart): The resting electrocardiogram demonstrated normal sinus rhythm, normal resting conduction, no resting arrhythmias and normal rest repolarization.  Stress electrocardiogram was normal.  There were no significant arrhythmias.  Patient exercised on Bruce protocol for 4 minutes and 2 seconds and achieved 93% of maximal predicted heart rate and 5.83 METS.  Stress symptoms included fatigue and dyspnea.  Normal BP response.  Exercise capacity was low.  Impression: Normal stress EKG.  Carotid US 04/27/2016 (copy on pt chart): Minimal stenosis in bilateral internal carotid arteries.  Minimal soft plaque noted bilateral carotids.  Tortuosity of the vessel may be source of bruit.  Antegrade right vertebral artery flow.  Antegrade left vertebral artery flow.  Echo 04/27/2016 (copy on pt chart): Left ventricle cavity is normal in size.  Mild concentric hypertrophy of the left ventricle.  Normal global wall motion.  Doppler evidence of grade 1 diastolic dysfunction.  Calculated EF 68%.  Trace mitral regurgitation.    Past Medical History:  Diagnosis Date  . Depression   . Fibromyalgia   . Generalized anxiety disorder 08/21/2018  . GERD (gastroesophageal reflux disease)   .  Hypertension   . Irritable bowel   . Mixed connective tissue disease ([place])   . Obstructive sleep apnea treated with continuous positive airway pressure (CPAP) 08/21/2018  . Osteoporosis   . Primary localized osteoarthritis of right knee     Past Surgical History:  Procedure Laterality Date  . BACK SURGERY    . CHOLECYSTECTOMY    . HAND SURGERY  2006    MEDICATIONS: . aspirin 81 MG chewable tablet  . aspirin EC 81 MG tablet  . B Complex-C (SUPER B COMPLEX PO)  .  busPIRone (BUSPAR) 15 MG tablet  . Ca Phosphate-Cholecalciferol (CALCIUM/VITAMIN D3 GUMMIES PO)  . carboxymethylcellulose (REFRESH PLUS) 0.5 % SOLN  . cetirizine (ZYRTEC) 10 MG tablet  . Cholecalciferol (VITAMIN D3) 2000 units TABS  . hydroxychloroquine (PLAQUENIL) 200 MG tablet  . LORazepam (ATIVAN) 0.5 MG tablet  . Magnesium 250 MG TABS  . Melatonin 5 MG TABS  . meloxicam (MOBIC) 7.5 MG tablet  . Multiple Vitamin (MULTIVITAMIN) tablet  . Omega-3 Fatty Acids (FISH OIL) 500 MG CAPS  . OVER THE COUNTER MEDICATION  . oxybutynin (DITROPAN) 5 MG tablet  . pantoprazole (PROTONIX) 40 MG tablet  . PRESCRIPTION MEDICATION  . Psyllium (METAMUCIL PO)  . sertraline (ZOLOFT) 100 MG tablet  . traMADol (ULTRAM) 50 MG tablet  . Turmeric Curcumin 500 MG CAPS  . valsartan-hydrochlorothiazide (DIOVAN-HCT) 160-25 MG tablet   No current facility-administered medications for this encounter.     Wynonia Musty [facility] Short Stay Center/Anesthesiology Phone [phone] 08/30/2018 10:07 AM

## 2018-08-31 MED ORDER — TRANEXAMIC ACID-NACL 1000-0.7 MG/100ML-% IV SOLN
1000.0000 mg | INTRAVENOUS | Status: AC
  Administered 2018-09-02: 1000 mg via INTRAVENOUS
  Filled 2018-08-31: qty 100

## 2018-08-31 MED ORDER — BUPIVACAINE LIPOSOME 1.3 % IJ SUSP
20.0000 mL | INTRAMUSCULAR | Status: DC
  Filled 2018-08-31: qty 20

## 2018-09-01 MED ORDER — BUPIVACAINE LIPOSOME 1.3 % IJ SUSP
20.0000 mL | INTRAMUSCULAR | Status: DC
  Filled 2018-09-01: qty 20

## 2018-09-02 ENCOUNTER — Encounter (HOSPITAL_COMMUNITY)
Admission: RE | Disposition: A | Discharge status: Home Health | Payer: Self-pay | Source: Home / Self Care | Attending: Orthopedic Surgery

## 2018-09-02 ENCOUNTER — Observation Stay (HOSPITAL_COMMUNITY): Payer: Medicare Other

## 2018-09-02 ENCOUNTER — Inpatient Hospital Stay (HOSPITAL_COMMUNITY): Payer: Medicare Other | Admitting: Physician Assistant

## 2018-09-02 ENCOUNTER — Inpatient Hospital Stay (HOSPITAL_COMMUNITY): Payer: Medicare Other | Admitting: Anesthesiology

## 2018-09-02 ENCOUNTER — Inpatient Hospital Stay (HOSPITAL_COMMUNITY)
Admission: RE | Admit: 2018-09-02 | Discharge: 2018-09-04 | DRG: 470 | Disposition: A | Discharge status: Home Health | Payer: Medicare Other | Attending: Orthopedic Surgery | Admitting: Orthopedic Surgery

## 2018-09-02 ENCOUNTER — Encounter (HOSPITAL_COMMUNITY): Payer: Self-pay | Admitting: *Deleted

## 2018-09-02 DIAGNOSIS — Z888 Allergy status to other drugs, medicaments and biological substances status: Secondary | ICD-10-CM

## 2018-09-02 DIAGNOSIS — I10 Essential (primary) hypertension: Secondary | ICD-10-CM | POA: Diagnosis present

## 2018-09-02 DIAGNOSIS — R9431 Abnormal electrocardiogram [ECG] [EKG]: Secondary | ICD-10-CM | POA: Diagnosis not present

## 2018-09-02 DIAGNOSIS — K589 Irritable bowel syndrome without diarrhea: Secondary | ICD-10-CM | POA: Diagnosis not present

## 2018-09-02 DIAGNOSIS — Z7982 Long term (current) use of aspirin: Secondary | ICD-10-CM

## 2018-09-02 DIAGNOSIS — Y838 Other surgical procedures as the cause of abnormal reaction of the patient, or of later complication, without mention of misadventure at the time of the procedure: Secondary | ICD-10-CM | POA: Diagnosis not present

## 2018-09-02 DIAGNOSIS — Z9049 Acquired absence of other specified parts of digestive tract: Secondary | ICD-10-CM

## 2018-09-02 DIAGNOSIS — M1711 Unilateral primary osteoarthritis, right knee: Secondary | ICD-10-CM | POA: Diagnosis not present

## 2018-09-02 DIAGNOSIS — Z79899 Other long term (current) drug therapy: Secondary | ICD-10-CM

## 2018-09-02 DIAGNOSIS — F419 Anxiety disorder, unspecified: Secondary | ICD-10-CM | POA: Diagnosis present

## 2018-09-02 DIAGNOSIS — F411 Generalized anxiety disorder: Secondary | ICD-10-CM | POA: Diagnosis not present

## 2018-09-02 DIAGNOSIS — I361 Nonrheumatic tricuspid (valve) insufficiency: Secondary | ICD-10-CM

## 2018-09-02 DIAGNOSIS — G8918 Other acute postprocedural pain: Secondary | ICD-10-CM | POA: Diagnosis not present

## 2018-09-02 DIAGNOSIS — I491 Atrial premature depolarization: Secondary | ICD-10-CM | POA: Diagnosis present

## 2018-09-02 DIAGNOSIS — Z79890 Hormone replacement therapy (postmenopausal): Secondary | ICD-10-CM

## 2018-09-02 DIAGNOSIS — F329 Major depressive disorder, single episode, unspecified: Secondary | ICD-10-CM | POA: Diagnosis present

## 2018-09-02 DIAGNOSIS — Y92239 Unspecified place in hospital as the place of occurrence of the external cause: Secondary | ICD-10-CM | POA: Diagnosis not present

## 2018-09-02 DIAGNOSIS — I441 Atrioventricular block, second degree: Secondary | ICD-10-CM | POA: Diagnosis not present

## 2018-09-02 DIAGNOSIS — I452 Bifascicular block: Secondary | ICD-10-CM | POA: Diagnosis present

## 2018-09-02 DIAGNOSIS — I447 Left bundle-branch block, unspecified: Secondary | ICD-10-CM | POA: Diagnosis not present

## 2018-09-02 DIAGNOSIS — M810 Age-related osteoporosis without current pathological fracture: Secondary | ICD-10-CM | POA: Diagnosis present

## 2018-09-02 DIAGNOSIS — I442 Atrioventricular block, complete: Secondary | ICD-10-CM

## 2018-09-02 DIAGNOSIS — R072 Precordial pain: Secondary | ICD-10-CM | POA: Diagnosis not present

## 2018-09-02 DIAGNOSIS — I340 Nonrheumatic mitral (valve) insufficiency: Secondary | ICD-10-CM

## 2018-09-02 DIAGNOSIS — G4733 Obstructive sleep apnea (adult) (pediatric): Secondary | ICD-10-CM | POA: Diagnosis present

## 2018-09-02 DIAGNOSIS — Z8249 Family history of ischemic heart disease and other diseases of the circulatory system: Secondary | ICD-10-CM | POA: Diagnosis not present

## 2018-09-02 DIAGNOSIS — K219 Gastro-esophageal reflux disease without esophagitis: Secondary | ICD-10-CM | POA: Diagnosis present

## 2018-09-02 DIAGNOSIS — M25761 Osteophyte, right knee: Secondary | ICD-10-CM | POA: Diagnosis present

## 2018-09-02 DIAGNOSIS — M797 Fibromyalgia: Secondary | ICD-10-CM | POA: Diagnosis not present

## 2018-09-02 DIAGNOSIS — I498 Other specified cardiac arrhythmias: Secondary | ICD-10-CM

## 2018-09-02 DIAGNOSIS — I97191 Other postprocedural cardiac functional disturbances following other surgery: Secondary | ICD-10-CM | POA: Diagnosis not present

## 2018-09-02 DIAGNOSIS — Z791 Long term (current) use of non-steroidal anti-inflammatories (NSAID): Secondary | ICD-10-CM

## 2018-09-02 DIAGNOSIS — Z9989 Dependence on other enabling machines and devices: Secondary | ICD-10-CM

## 2018-09-02 DIAGNOSIS — M21061 Valgus deformity, not elsewhere classified, right knee: Secondary | ICD-10-CM | POA: Diagnosis present

## 2018-09-02 DIAGNOSIS — Z8349 Family history of other endocrine, nutritional and metabolic diseases: Secondary | ICD-10-CM

## 2018-09-02 HISTORY — DX: Generalized anxiety disorder: F41.1

## 2018-09-02 HISTORY — DX: Unilateral primary osteoarthritis, right knee: M17.11

## 2018-09-02 HISTORY — DX: Obstructive sleep apnea (adult) (pediatric): G47.33

## 2018-09-02 HISTORY — DX: Atrial premature depolarization: I49.1

## 2018-09-02 HISTORY — DX: Dependence on other enabling machines and devices: Z99.89

## 2018-09-02 HISTORY — DX: Bradycardia, unspecified: R00.1

## 2018-09-02 HISTORY — PX: TOTAL KNEE ARTHROPLASTY: SHX125

## 2018-09-02 LAB — BASIC METABOLIC PANEL
Anion gap: 5 (ref 5–15)
BUN: 17 mg/dL (ref 8–23)
CHLORIDE: 108 mmol/L (ref 98–111)
CO2: 25 mmol/L (ref 22–32)
Calcium: 8.2 mg/dL — ABNORMAL LOW (ref 8.9–10.3)
Creatinine, Ser: 1.1 mg/dL — ABNORMAL HIGH (ref 0.44–1.00)
GFR calc Af Amer: 57 mL/min — ABNORMAL LOW (ref >60)
GFR calc non Af Amer: 49 mL/min — ABNORMAL LOW (ref >60)
GLUCOSE: 138 mg/dL — AB (ref 70–99)
POTASSIUM: 4.3 mmol/L (ref 3.5–5.1)
Sodium: 138 mmol/L (ref 135–145)

## 2018-09-02 LAB — CBC
HEMATOCRIT: 34.2 % — AB (ref 36.0–46.0)
HEMOGLOBIN: 10.6 g/dL — AB (ref 12.0–15.0)
MCH: 26.4 pg (ref 26.0–34.0)
MCHC: 31 g/dL (ref 30.0–36.0)
MCV: 85.1 fL (ref 80.0–100.0)
Platelets: 255 10*3/uL (ref 150–400)
RBC: 4.02 MIL/uL (ref 3.87–5.11)
RDW: 13.2 % (ref 11.5–15.5)
WBC: 9 10*3/uL (ref 4.0–10.5)
nRBC: 0 % (ref 0.0–0.2)

## 2018-09-02 LAB — TROPONIN I: Troponin I: <0.03 ng/mL (ref <0.03)

## 2018-09-02 LAB — TSH: TSH: 1.066 u[IU]/mL (ref 0.350–4.500)

## 2018-09-02 LAB — MAGNESIUM: Magnesium: 1.5 mg/dL — ABNORMAL LOW (ref 1.7–2.4)

## 2018-09-02 SURGERY — ARTHROPLASTY, KNEE, TOTAL
Anesthesia: Spinal | Laterality: Right

## 2018-09-02 MED ORDER — OXYCODONE HCL 5 MG PO TABS
ORAL_TABLET | ORAL | Status: AC
  Filled 2018-09-02: qty 2

## 2018-09-02 MED ORDER — ROPIVACAINE HCL 7.5 MG/ML IJ SOLN
INTRAMUSCULAR | Status: DC | PRN
  Administered 2018-09-02: 20 mL via PERINEURAL

## 2018-09-02 MED ORDER — TRANEXAMIC ACID-NACL 1000-0.7 MG/100ML-% IV SOLN
1000.0000 mg | Freq: Once | INTRAVENOUS | Status: AC
  Administered 2018-09-02: 1000 mg via INTRAVENOUS
  Filled 2018-09-02: qty 100

## 2018-09-02 MED ORDER — ASPIRIN EC 325 MG PO TBEC
325.0000 mg | DELAYED_RELEASE_TABLET | Freq: Every day | ORAL | Status: DC
  Administered 2018-09-03 – 2018-09-04 (×2): 325 mg via ORAL
  Filled 2018-09-02 (×2): qty 1

## 2018-09-02 MED ORDER — PHENYLEPHRINE 40 MCG/ML (10ML) SYRINGE FOR IV PUSH (FOR BLOOD PRESSURE SUPPORT)
PREFILLED_SYRINGE | INTRAVENOUS | Status: DC | PRN
  Administered 2018-09-02 (×3): 80 ug via INTRAVENOUS

## 2018-09-02 MED ORDER — SODIUM CHLORIDE 0.9 % IV SOLN
INTRAVENOUS | Status: DC | PRN
  Administered 2018-09-02: 20 ug/min via INTRAVENOUS

## 2018-09-02 MED ORDER — CEFAZOLIN SODIUM-DEXTROSE 2-4 GM/100ML-% IV SOLN
2.0000 g | Freq: Four times a day (QID) | INTRAVENOUS | Status: AC
  Administered 2018-09-02 – 2018-09-03 (×2): 2 g via INTRAVENOUS
  Filled 2018-09-02 (×2): qty 100

## 2018-09-02 MED ORDER — EPHEDRINE 5 MG/ML INJ
INTRAVENOUS | Status: AC
  Filled 2018-09-02: qty 10

## 2018-09-02 MED ORDER — MENTHOL 3 MG MT LOZG: 1.0000 | LOZENGE | OROMUCOSAL | Status: DC | PRN

## 2018-09-02 MED ORDER — MAGNESIUM OXIDE 400 (241.3 MG) MG PO TABS
200.0000 mg | ORAL_TABLET | Freq: Every day | ORAL | Status: DC
  Administered 2018-09-02 – 2018-09-03 (×2): 200 mg via ORAL
  Filled 2018-09-02 (×2): qty 1

## 2018-09-02 MED ORDER — MEPERIDINE HCL 50 MG/ML IJ SOLN: 6.2500 mg | INTRAMUSCULAR | Status: DC | PRN

## 2018-09-02 MED ORDER — BUPIVACAINE LIPOSOME 1.3 % IJ SUSP
INTRAMUSCULAR | Status: DC | PRN
  Administered 2018-09-02: 20 mL

## 2018-09-02 MED ORDER — BUSPIRONE HCL 5 MG PO TABS
15.0000 mg | ORAL_TABLET | Freq: Two times a day (BID) | ORAL | Status: DC
  Administered 2018-09-02 – 2018-09-04 (×4): 15 mg via ORAL
  Filled 2018-09-02 (×4): qty 1

## 2018-09-02 MED ORDER — OXYBUTYNIN CHLORIDE 5 MG PO TABS
5.0000 mg | ORAL_TABLET | Freq: Every day | ORAL | Status: DC
  Administered 2018-09-03 – 2018-09-04 (×2): 5 mg via ORAL
  Filled 2018-09-02 (×2): qty 1

## 2018-09-02 MED ORDER — BUPIVACAINE IN DEXTROSE 0.75-8.25 % IT SOLN: INTRATHECAL | Status: DC | PRN

## 2018-09-02 MED ORDER — DEXAMETHASONE SODIUM PHOSPHATE 10 MG/ML IJ SOLN
INTRAMUSCULAR | Status: AC
  Filled 2018-09-02: qty 1

## 2018-09-02 MED ORDER — DIPHENHYDRAMINE HCL 12.5 MG/5ML PO ELIX: 12.5000 mg | ORAL_SOLUTION | ORAL | Status: DC | PRN

## 2018-09-02 MED ORDER — FENTANYL CITRATE (PF) 250 MCG/5ML IJ SOLN
INTRAMUSCULAR | Status: AC
  Filled 2018-09-02: qty 5

## 2018-09-02 MED ORDER — VITAMIN D 25 MCG (1000 UNIT) PO TABS
2000.0000 [IU] | ORAL_TABLET | Freq: Every day | ORAL | Status: DC
  Administered 2018-09-03 – 2018-09-04 (×2): 2000 [IU] via ORAL
  Filled 2018-09-02 (×2): qty 2

## 2018-09-02 MED ORDER — METOCLOPRAMIDE HCL 5 MG PO TABS: 5.0000 mg | ORAL_TABLET | Freq: Three times a day (TID) | ORAL | Status: DC | PRN

## 2018-09-02 MED ORDER — SUCCINYLCHOLINE CHLORIDE 200 MG/10ML IV SOSY
PREFILLED_SYRINGE | INTRAVENOUS | Status: AC
  Filled 2018-09-02: qty 10

## 2018-09-02 MED ORDER — CARBOXYMETHYLCELLULOSE SODIUM 0.5 % OP SOLN: 1.0000 [drp] | Freq: Three times a day (TID) | OPHTHALMIC | Status: DC

## 2018-09-02 MED ORDER — CEFAZOLIN SODIUM-DEXTROSE 2-4 GM/100ML-% IV SOLN
2.0000 g | INTRAVENOUS | Status: AC
  Administered 2018-09-02: 2 g via INTRAVENOUS
  Filled 2018-09-02: qty 100

## 2018-09-02 MED ORDER — 0.9 % SODIUM CHLORIDE (POUR BTL) OPTIME
TOPICAL | Status: DC | PRN
  Administered 2018-09-02: 1000 mL

## 2018-09-02 MED ORDER — OXYCODONE HCL 5 MG PO TABS
5.0000 mg | ORAL_TABLET | ORAL | Status: DC | PRN
  Administered 2018-09-02 – 2018-09-03 (×3): 10 mg via ORAL
  Filled 2018-09-02 (×2): qty 2

## 2018-09-02 MED ORDER — BUPIVACAINE-EPINEPHRINE 0.5% -1:200000 IJ SOLN
INTRAMUSCULAR | Status: DC | PRN
  Administered 2018-09-02: 50 mL

## 2018-09-02 MED ORDER — HYDROMORPHONE HCL 1 MG/ML IJ SOLN
INTRAMUSCULAR | Status: AC
  Filled 2018-09-02: qty 1

## 2018-09-02 MED ORDER — ONDANSETRON HCL 4 MG/2ML IJ SOLN: 4.0000 mg | Freq: Once | INTRAMUSCULAR | Status: DC | PRN

## 2018-09-02 MED ORDER — BUSPIRONE HCL 5 MG PO TABS: 15.0000 mg | ORAL_TABLET | Freq: Two times a day (BID) | ORAL | Status: DC

## 2018-09-02 MED ORDER — MIDAZOLAM HCL 5 MG/5ML IJ SOLN
INTRAMUSCULAR | Status: DC | PRN
  Administered 2018-09-02: 2 mg via INTRAVENOUS

## 2018-09-02 MED ORDER — FENTANYL CITRATE (PF) 100 MCG/2ML IJ SOLN
INTRAMUSCULAR | Status: DC | PRN
  Administered 2018-09-02 (×2): 50 ug via INTRAVENOUS

## 2018-09-02 MED ORDER — LORAZEPAM 0.5 MG PO TABS
0.5000 mg | ORAL_TABLET | Freq: Every day | ORAL | Status: DC
  Administered 2018-09-02 – 2018-09-03 (×2): 0.5 mg via ORAL
  Filled 2018-09-02 (×3): qty 1

## 2018-09-02 MED ORDER — PROPOFOL 10 MG/ML IV BOLUS
INTRAVENOUS | Status: AC
  Filled 2018-09-02: qty 20

## 2018-09-02 MED ORDER — ONDANSETRON HCL 4 MG PO TABS: 4.0000 mg | ORAL_TABLET | Freq: Four times a day (QID) | ORAL | Status: DC | PRN

## 2018-09-02 MED ORDER — METOCLOPRAMIDE HCL 5 MG/ML IJ SOLN: 5.0000 mg | Freq: Three times a day (TID) | INTRAMUSCULAR | Status: DC | PRN

## 2018-09-02 MED ORDER — MAGNESIUM SULFATE 2 GM/50ML IV SOLN
2.0000 g | Freq: Once | INTRAVENOUS | Status: AC
  Administered 2018-09-02: 2 g via INTRAVENOUS
  Filled 2018-09-02: qty 50

## 2018-09-02 MED ORDER — PHENYLEPHRINE 40 MCG/ML (10ML) SYRINGE FOR IV PUSH (FOR BLOOD PRESSURE SUPPORT)
PREFILLED_SYRINGE | INTRAVENOUS | Status: AC
  Filled 2018-09-02: qty 10

## 2018-09-02 MED ORDER — MIDAZOLAM HCL 2 MG/2ML IJ SOLN
INTRAMUSCULAR | Status: AC
  Filled 2018-09-02: qty 2

## 2018-09-02 MED ORDER — CEFUROXIME SODIUM 1.5 G IV SOLR
INTRAVENOUS | Status: DC | PRN
  Administered 2018-09-02: 1.5 g via INTRAVENOUS

## 2018-09-02 MED ORDER — SODIUM CHLORIDE 0.9 % IR SOLN
Status: DC | PRN
  Administered 2018-09-02: 3000 mL

## 2018-09-02 MED ORDER — PHENOL 1.4 % MT LIQD: 1.0000 | OROMUCOSAL | Status: DC | PRN

## 2018-09-02 MED ORDER — ROCURONIUM BROMIDE 50 MG/5ML IV SOSY
PREFILLED_SYRINGE | INTRAVENOUS | Status: AC
  Filled 2018-09-02: qty 5

## 2018-09-02 MED ORDER — ROPIVACAINE HCL 7.5 MG/ML IJ SOLN: INTRAMUSCULAR | Status: DC | PRN

## 2018-09-02 MED ORDER — SODIUM CHLORIDE 0.9 % IV BOLUS
500.0000 mL | Freq: Once | INTRAVENOUS | Status: AC
  Administered 2018-09-02: 500 mL via INTRAVENOUS

## 2018-09-02 MED ORDER — LIDOCAINE 2% (20 MG/ML) 5 ML SYRINGE
INTRAMUSCULAR | Status: AC
  Filled 2018-09-02: qty 5

## 2018-09-02 MED ORDER — PROPOFOL 500 MG/50ML IV EMUL
INTRAVENOUS | Status: DC | PRN
  Administered 2018-09-02: 100 ug/kg/min via INTRAVENOUS

## 2018-09-02 MED ORDER — GABAPENTIN 300 MG PO CAPS
300.0000 mg | ORAL_CAPSULE | Freq: Every day | ORAL | Status: DC
  Administered 2018-09-02 – 2018-09-03 (×2): 300 mg via ORAL
  Filled 2018-09-02 (×2): qty 1

## 2018-09-02 MED ORDER — ONDANSETRON HCL 4 MG/2ML IJ SOLN
INTRAMUSCULAR | Status: DC | PRN
  Administered 2018-09-02: 4 mg via INTRAVENOUS

## 2018-09-02 MED ORDER — CEFUROXIME SODIUM 1.5 G IV SOLR
INTRAVENOUS | Status: AC
  Filled 2018-09-02: qty 1.5

## 2018-09-02 MED ORDER — PROPOFOL 10 MG/ML IV BOLUS
INTRAVENOUS | Status: DC | PRN
  Administered 2018-09-02 (×2): 20 mg via INTRAVENOUS

## 2018-09-02 MED ORDER — DOCUSATE SODIUM 100 MG PO CAPS
100.0000 mg | ORAL_CAPSULE | Freq: Two times a day (BID) | ORAL | Status: DC
  Administered 2018-09-04: 100 mg via ORAL
  Filled 2018-09-02 (×3): qty 1

## 2018-09-02 MED ORDER — DEXAMETHASONE SODIUM PHOSPHATE 10 MG/ML IJ SOLN
INTRAMUSCULAR | Status: DC | PRN
  Administered 2018-09-02: 10 mg via INTRAVENOUS

## 2018-09-02 MED ORDER — HYDROMORPHONE HCL 1 MG/ML IJ SOLN
0.5000 mg | INTRAMUSCULAR | Status: DC | PRN
  Administered 2018-09-03: 0.5 mg via INTRAVENOUS
  Administered 2018-09-03 (×2): 1 mg via INTRAVENOUS
  Filled 2018-09-02 (×3): qty 1

## 2018-09-02 MED ORDER — ONDANSETRON HCL 4 MG/2ML IJ SOLN
INTRAMUSCULAR | Status: AC
  Filled 2018-09-02: qty 2

## 2018-09-02 MED ORDER — LORATADINE 10 MG PO TABS
10.0000 mg | ORAL_TABLET | Freq: Every day | ORAL | Status: DC
  Administered 2018-09-03 – 2018-09-04 (×2): 10 mg via ORAL
  Filled 2018-09-02 (×2): qty 1

## 2018-09-02 MED ORDER — ALUM & MAG HYDROXIDE-SIMETH 200-200-20 MG/5ML PO SUSP: 30.0000 mL | ORAL | Status: DC | PRN

## 2018-09-02 MED ORDER — SERTRALINE HCL 50 MG PO TABS
150.0000 mg | ORAL_TABLET | Freq: Every day | ORAL | Status: DC
  Administered 2018-09-03 – 2018-09-04 (×2): 150 mg via ORAL
  Filled 2018-09-02 (×2): qty 1

## 2018-09-02 MED ORDER — ACETAMINOPHEN 500 MG PO TABS
1000.0000 mg | ORAL_TABLET | Freq: Four times a day (QID) | ORAL | Status: AC
  Administered 2018-09-02 – 2018-09-03 (×3): 1000 mg via ORAL
  Filled 2018-09-02 (×4): qty 2

## 2018-09-02 MED ORDER — POVIDONE-IODINE 7.5 % EX SOLN
Freq: Once | CUTANEOUS | Status: DC
  Filled 2018-09-02: qty 118

## 2018-09-02 MED ORDER — LACTATED RINGERS IV SOLN
INTRAVENOUS | Status: DC
  Administered 2018-09-02 (×2): via INTRAVENOUS

## 2018-09-02 MED ORDER — PHENYLEPHRINE 40 MCG/ML (10ML) SYRINGE FOR IV PUSH (FOR BLOOD PRESSURE SUPPORT)
PREFILLED_SYRINGE | INTRAVENOUS | Status: AC
  Filled 2018-09-02: qty 20

## 2018-09-02 MED ORDER — HYDROMORPHONE HCL 1 MG/ML IJ SOLN
0.2500 mg | INTRAMUSCULAR | Status: DC | PRN
  Administered 2018-09-02 (×4): 0.5 mg via INTRAVENOUS

## 2018-09-02 MED ORDER — VITAMIN D3 50 MCG (2000 UT) PO TABS: 2000.0000 [IU] | ORAL_TABLET | Freq: Every day | ORAL | Status: DC

## 2018-09-02 MED ORDER — POTASSIUM CHLORIDE IN NACL 20-0.9 MEQ/L-% IV SOLN
INTRAVENOUS | Status: DC
  Administered 2018-09-02: 15:00:00 via INTRAVENOUS
  Filled 2018-09-02 (×2): qty 1000

## 2018-09-02 MED ORDER — DEXAMETHASONE SODIUM PHOSPHATE 10 MG/ML IJ SOLN
10.0000 mg | Freq: Three times a day (TID) | INTRAMUSCULAR | Status: AC
  Administered 2018-09-02 – 2018-09-03 (×4): 10 mg via INTRAVENOUS
  Filled 2018-09-02 (×4): qty 1

## 2018-09-02 MED ORDER — MAGNESIUM 250 MG PO TABS: 250.0000 mg | ORAL_TABLET | Freq: Every day | ORAL | Status: DC

## 2018-09-02 MED ORDER — ONDANSETRON HCL 4 MG/2ML IJ SOLN: 4.0000 mg | Freq: Four times a day (QID) | INTRAMUSCULAR | Status: DC | PRN

## 2018-09-02 MED ORDER — POLYVINYL ALCOHOL 1.4 % OP SOLN
1.0000 [drp] | Freq: Three times a day (TID) | OPHTHALMIC | Status: DC
  Administered 2018-09-02 – 2018-09-04 (×6): 1 [drp] via OPHTHALMIC
  Filled 2018-09-02: qty 15

## 2018-09-02 MED ORDER — POLYETHYLENE GLYCOL 3350 17 G PO PACK
17.0000 g | PACK | Freq: Two times a day (BID) | ORAL | Status: DC
  Filled 2018-09-02 (×2): qty 1

## 2018-09-02 MED ORDER — BUPIVACAINE IN DEXTROSE 0.75-8.25 % IT SOLN
INTRATHECAL | Status: DC | PRN
  Administered 2018-09-02: 1.6 mL via INTRATHECAL

## 2018-09-02 MED ORDER — PANTOPRAZOLE SODIUM 40 MG PO TBEC
40.0000 mg | DELAYED_RELEASE_TABLET | Freq: Every day | ORAL | Status: DC
  Administered 2018-09-03 – 2018-09-04 (×2): 40 mg via ORAL
  Filled 2018-09-02 (×2): qty 1

## 2018-09-02 MED ORDER — BUPIVACAINE-EPINEPHRINE 0.25% -1:200000 IJ SOLN
INTRAMUSCULAR | Status: AC
  Filled 2018-09-02: qty 1

## 2018-09-02 MED ORDER — SODIUM CHLORIDE (PF) 0.9 % IJ SOLN
INTRAMUSCULAR | Status: DC | PRN
  Administered 2018-09-02: 10 mL

## 2018-09-02 MED ORDER — OXYCODONE HCL 5 MG PO TABS
ORAL_TABLET | ORAL | Status: AC
  Filled 2018-09-02: qty 1

## 2018-09-02 SURGICAL SUPPLY — 86 items
APL SKNCLS STERI-STRIP NONHPOA (GAUZE/BANDAGES/DRESSINGS) ×1
ATTUNE MED DOME PAT 32 KNEE (Knees) ×1 IMPLANT
ATTUNE MED DOME PAT 32MM KNEE (Knees) ×1 IMPLANT
ATTUNE PS FEM RT SZ 4 CEM KNEE (Femur) ×2 IMPLANT
ATTUNE PSRP INSR SZ4 5 KNEE (Insert) ×1 IMPLANT
ATTUNE PSRP INSR SZ4 5MM KNEE (Insert) ×1 IMPLANT
BANDAGE ESMARK 6X9 LF (GAUZE/BANDAGES/DRESSINGS) ×1 IMPLANT
BASEPLATE TIBIAL ROTATING SZ 4 (Knees) ×2 IMPLANT
BENZOIN TINCTURE PRP APPL 2/3 (GAUZE/BANDAGES/DRESSINGS) ×3 IMPLANT
BLADE SAGITTAL 25.0X1.19X90 (BLADE) ×2 IMPLANT
BLADE SAGITTAL 25.0X1.19X90MM (BLADE) ×1
BLADE SAW SGTL 13X75X1.27 (BLADE) ×3 IMPLANT
BLADE SURG 10 STRL SS (BLADE) ×6 IMPLANT
BNDG CMPR 9X6 STRL LF SNTH (GAUZE/BANDAGES/DRESSINGS) ×1
BNDG CMPR MED 10X6 ELC LF (GAUZE/BANDAGES/DRESSINGS) ×1
BNDG CMPR MED 15X6 ELC VLCR LF (GAUZE/BANDAGES/DRESSINGS) ×1
BNDG ELASTIC 6X10 VLCR STRL LF (GAUZE/BANDAGES/DRESSINGS) ×2 IMPLANT
BNDG ELASTIC 6X15 VLCR STRL LF (GAUZE/BANDAGES/DRESSINGS) ×3 IMPLANT
BNDG ESMARK 6X9 LF (GAUZE/BANDAGES/DRESSINGS) ×3
BOWL SMART MIX CTS (DISPOSABLE) ×3 IMPLANT
BSPLAT TIB 4 CMNT ROT PLAT STR (Knees) ×1 IMPLANT
CEMENT HV SMART SET (Cement) ×6 IMPLANT
CLOSURE STERI-STRIP 1/2X4 (GAUZE/BANDAGES/DRESSINGS) ×1
CLOSURE WOUND 1/2 X4 (GAUZE/BANDAGES/DRESSINGS) ×1
CLSR STERI-STRIP ANTIMIC 1/2X4 (GAUZE/BANDAGES/DRESSINGS) ×1 IMPLANT
COVER SURGICAL LIGHT HANDLE (MISCELLANEOUS) ×3 IMPLANT
COVER WAND RF STERILE (DRAPES) ×3 IMPLANT
CUFF TOURNIQUET SINGLE 34IN LL (TOURNIQUET CUFF) ×3 IMPLANT
CUFF TOURNIQUET SINGLE 44IN (TOURNIQUET CUFF) IMPLANT
DECANTER SPIKE VIAL GLASS SM (MISCELLANEOUS) ×1 IMPLANT
DRAPE EXTREMITY T 121X128X90 (DRAPE) ×3 IMPLANT
DRAPE HALF SHEET 40X57 (DRAPES) ×6 IMPLANT
DRAPE INCISE IOBAN 66X45 STRL (DRAPES) IMPLANT
DRAPE ORTHO SPLIT 77X108 STRL (DRAPES) ×3
DRAPE SURG ORHT 6 SPLT 77X108 (DRAPES) ×1 IMPLANT
DRAPE U-SHAPE 47X51 STRL (DRAPES) ×3 IMPLANT
DRSG AQUACEL AG ADV 3.5X10 (GAUZE/BANDAGES/DRESSINGS) ×3 IMPLANT
DURAPREP 26ML APPLICATOR (WOUND CARE) ×3 IMPLANT
ELECT CAUTERY BLADE 6.4 (BLADE) ×3 IMPLANT
ELECT REM PT RETURN 9FT ADLT (ELECTROSURGICAL) ×3
ELECTRODE REM PT RTRN 9FT ADLT (ELECTROSURGICAL) ×1 IMPLANT
FACESHIELD WRAPAROUND (MASK) ×3 IMPLANT
FACESHIELD WRAPAROUND OR TEAM (MASK) ×1 IMPLANT
GLOVE BIO SURGEON STRL SZ7 (GLOVE) ×3 IMPLANT
GLOVE BIOGEL PI IND STRL 7.0 (GLOVE) ×1 IMPLANT
GLOVE BIOGEL PI IND STRL 7.5 (GLOVE) ×1 IMPLANT
GLOVE BIOGEL PI INDICATOR 7.0 (GLOVE) ×2
GLOVE BIOGEL PI INDICATOR 7.5 (GLOVE) ×2
GLOVE SS BIOGEL STRL SZ 7.5 (GLOVE) ×1 IMPLANT
GLOVE SUPERSENSE BIOGEL SZ 7.5 (GLOVE) ×2
GOWN STRL REUS W/ TWL LRG LVL3 (GOWN DISPOSABLE) ×1 IMPLANT
GOWN STRL REUS W/ TWL XL LVL3 (GOWN DISPOSABLE) ×1 IMPLANT
GOWN STRL REUS W/TWL LRG LVL3 (GOWN DISPOSABLE) ×3
GOWN STRL REUS W/TWL XL LVL3 (GOWN DISPOSABLE) ×3
HANDPIECE INTERPULSE COAX TIP (DISPOSABLE) ×3
HOOD PEEL AWAY FACE SHEILD DIS (HOOD) ×6 IMPLANT
IMMOBILIZER KNEE 20 (SOFTGOODS) ×3
IMMOBILIZER KNEE 20 THIGH 36 (SOFTGOODS) IMPLANT
IMMOBILIZER KNEE 22 UNIV (SOFTGOODS) ×3 IMPLANT
KIT BASIN OR (CUSTOM PROCEDURE TRAY) ×3 IMPLANT
KIT TURNOVER KIT B (KITS) ×3 IMPLANT
MANIFOLD NEPTUNE II (INSTRUMENTS) ×3 IMPLANT
MARKER SKIN DUAL TIP RULER LAB (MISCELLANEOUS) ×3 IMPLANT
NEEDLE HYPO 22GX1.5 SAFETY (NEEDLE) ×6 IMPLANT
NS IRRIG 1000ML POUR BTL (IV SOLUTION) ×3 IMPLANT
PACK TOTAL JOINT (CUSTOM PROCEDURE TRAY) ×3 IMPLANT
PAD ARMBOARD 7.5X6 YLW CONV (MISCELLANEOUS) ×6 IMPLANT
PIN STEINMAN FIXATION KNEE (PIN) ×2 IMPLANT
PIN THREADED HEADED SIGMA (PIN) ×2 IMPLANT
SET HNDPC FAN SPRY TIP SCT (DISPOSABLE) ×1 IMPLANT
STRIP CLOSURE SKIN 1/2X4 (GAUZE/BANDAGES/DRESSINGS) ×2 IMPLANT
SUCTION FRAZIER HANDLE 10FR (MISCELLANEOUS) ×2
SUCTION TUBE FRAZIER 10FR DISP (MISCELLANEOUS) ×1 IMPLANT
SUT MNCRL AB 3-0 PS2 18 (SUTURE) ×3 IMPLANT
SUT VIC AB 0 CT1 27 (SUTURE) ×6
SUT VIC AB 0 CT1 27XBRD ANBCTR (SUTURE) ×2 IMPLANT
SUT VIC AB 1 CT1 27 (SUTURE) ×3
SUT VIC AB 1 CT1 27XBRD ANBCTR (SUTURE) ×1 IMPLANT
SUT VIC AB 2-0 CT1 27 (SUTURE) ×6
SUT VIC AB 2-0 CT1 TAPERPNT 27 (SUTURE) ×2 IMPLANT
SYR CONTROL 10ML LL (SYRINGE) ×6 IMPLANT
TOWEL OR 17X24 6PK STRL BLUE (TOWEL DISPOSABLE) ×3 IMPLANT
TOWEL OR 17X26 10 PK STRL BLUE (TOWEL DISPOSABLE) ×3 IMPLANT
TRAY CATH 16FR W/PLASTIC CATH (SET/KITS/TRAYS/PACK) IMPLANT
TRAY FOLEY CATH SILVER 16FR (SET/KITS/TRAYS/PACK) ×3 IMPLANT
WATER STERILE IRR 1000ML POUR (IV SOLUTION) ×3 IMPLANT

## 2018-09-02 NOTE — Anesthesia Procedure Notes (Signed)
Spinal  Start time: 09/02/2018 7:15 AM End time: 09/02/2018 7:18 AM Staffing Anesthesiologist: Lillia Abed, MD Performed: anesthesiologist  Preanesthetic Checklist Completed: patient identified, surgical consent, pre-op evaluation, timeout performed, IV checked, risks and benefits discussed and monitors and equipment checked Spinal Block Patient position: sitting Prep: ChloraPrep Patient monitoring: heart rate, cardiac monitor, continuous pulse ox and blood pressure Approach: right paramedian Location: L3-4 Injection technique: single-shot Needle Needle type: Pencan  Needle gauge: 24 G Needle length: 9 cm Needle insertion depth: 6 cm

## 2018-09-02 NOTE — Anesthesia Procedure Notes (Signed)
Anesthesia Regional Block: Adductor canal block   Pre-Anesthetic Checklist: ,, timeout performed, Correct Patient, Correct Site, Correct Laterality, Correct Procedure, Correct Position, site marked, Risks and benefits discussed,  Surgical consent,  Pre-op evaluation,  At surgeon's request and post-op pain management  Laterality: Right  Prep: chloraprep       Needles:  Injection technique: Single-shot  Needle Type: Echogenic Stimulator Needle     Needle Length: 9cm  Needle Gauge: 21     Additional Needles:   Narrative:  Start time: 09/02/2018 6:55 AM End time: 09/02/2018 7:05 AM Injection made incrementally with aspirations every 5 mL.  Performed by: Personally  Anesthesiologist: Lillia Abed, MD  Additional Notes: Monitors applied. Patient sedated. Sterile prep and drape,hand hygiene and sterile gloves were used. Relevant anatomy identified.Needle position confirmed.Local anesthetic injected incrementally after negative aspiration. Local anesthetic spread visualized around nerve(s). Vascular puncture avoided. No complications. Image printed for medical record.The patient tolerated the procedure well.    Lillia Abed MD

## 2018-09-02 NOTE — Progress Notes (Signed)
Patient refused CPAP tonight. Patient forgot mask and tubing and prefers to not use ours. She will wear here 2L [place] tonight.

## 2018-09-02 NOTE — Consult Note (Addendum)
Cardiology Consultation:   Patient ID: Susan Hicks; [id number]; Sep 15, 1946   Admit date: 09/02/2018 Date of Consult: 09/02/2018  Primary Care Provider: Glenda Chroman, MD Primary Cardiologist: Carlyle Dolly, MD Primary Electrophysiologist:  Cristopher Peru, MD (2017)  Chief Complaint: chest discomfort, found to have intermittent LBBB and AIVR on monitor in PACU  Patient Profile:   Susan Hicks is a 72 y.o. female with a hx of junctional bradycardia in 2017 (resolved off beta blocker), previous dx of fibromyalgia now considered mixed connective tissue disorder, HTN, OSA, PACs, depression, mild renal insufficiency on pre-op labs (Cr 1.38, prev 1.06) who is being seen today for the evaluation of intermittent LBBB at the request of Dr. Noemi Chapel.  History of Present Illness:   To recap, Susan Hicks was remotely evaluated by EP in 2017 during admission for extreme weakness felt due to symptomatic bradycardia with HR in the 30s, found to have junctional bradycardia. Metoprolol 50mg  was discontinued and her bradycardia resolved. She has since seen Dr. Harl Bowie in 06/2017 at which time she reported infrequent episodes of sudden breathlessness/weakness - son who is a paramedic checked pulse during one time and it was irregular. Rhythm strip reportedly showed PACs. On one occasion HR reported to be 120s. These episodes have happened about twice a year since that time with event monitoring not performed given low yield with infrequency of events. She reports a prior dx of chest wall pain many years ago but no recent cardiac testing. She is fairly active in gardening and going up steps without any angina or SOB so was recently cleared for knee surgery in 06/2018 OV.  She underwent right total knee replacement today, the surgery of which was fairly uneventful. In PACU she was noted to go in and out of a LBBB on telemetry. She reported onset of vague chest discomfort across her chest about  an hour after coming into recovery. This migrated into her epigastrum. No other associated sx. During interview she reported this as a 7/10 discomfort. However, she then reported episodes of lightheadedness during which which telemetry showed runs of AIVR with a RBBB morphology. There was also intermittent evidence of 2nd degree AVB type 1 with brief dropped beats but no sustained pauses. After the runs of AIVR settled down without specific intervention, she reported her chest discomfort improved significantly. She now just notes a generalized sense of head fullness. The discomfort was not worse with inspiration or palpation. Dr. Sallyanne Kuster was called to beside. Repeat EKG while in NSR with narrow complex after CP improved showed no significant ischemic changes, except QTC prolonged at 550ms.   Past Medical History:  Diagnosis Date  . Depression   . Fibromyalgia    now considered mixed connective tissue disorder  . Generalized anxiety disorder 08/21/2018  . GERD (gastroesophageal reflux disease)   . Hypertension   . Irritable bowel   . Junctional bradycardia    a. 2017 - beta blocker discontinued.  . Mixed connective tissue disease ([place])   . Obstructive sleep apnea treated with continuous positive airway pressure (CPAP) 08/21/2018  . Osteoporosis   . Premature atrial contractions   . Primary localized osteoarthritis of right knee     Past Surgical History:  Procedure Laterality Date  . BACK SURGERY    . CHOLECYSTECTOMY    . HAND SURGERY  2006     Inpatient Medications: Scheduled Meds: . bupivacaine liposome  20 mL Other To OR  . HYDROmorphone      .  HYDROmorphone      . oxyCODONE      . oxyCODONE      . povidone-iodine   Topical Once   Continuous Infusions: . lactated ringers     PRN Meds: HYDROmorphone (DILAUDID) injection, meperidine (DEMEROL) injection, ondansetron (ZOFRAN) IV, oxyCODONE  Home Meds: Prior to Admission medications   Medication Sig Start Date End Date Taking?  Authorizing Provider  aspirin EC 81 MG tablet Take 81 mg by mouth daily.   Yes [provider]  B Complex-C (SUPER B COMPLEX PO) Take 1 tablet by mouth daily.   Yes [provider]  busPIRone (BUSPAR) 15 MG tablet Take 15 mg by mouth 2 (two) times daily.   Yes [provider]  Ca Phosphate-Cholecalciferol (CALCIUM/VITAMIN D3 GUMMIES PO) Take 1 each by mouth 2 (two) times daily.   Yes [provider]  carboxymethylcellulose (REFRESH PLUS) 0.5 % SOLN Place 1 drop into both eyes 3 (three) times daily.   Yes [provider]  cetirizine (ZYRTEC) 10 MG tablet Take 10 mg by mouth daily.   Yes [provider]  Cholecalciferol (VITAMIN D3) 2000 units TABS Take 2,000 Units by mouth daily.    Yes [provider]  hydroxychloroquine (PLAQUENIL) 200 MG tablet Take 200 mg by mouth 2 (two) times daily. 08/14/18  Yes [provider]  LORazepam (ATIVAN) 0.5 MG tablet Take 0.5 mg by mouth at bedtime.   Yes [provider]  Magnesium 250 MG TABS Take 250 mg by mouth at bedtime.   Yes [provider]  Melatonin 5 MG TABS Take 5 mg by mouth at bedtime.   Yes [provider]  meloxicam (MOBIC) 7.5 MG tablet Take 7.5 mg by mouth 2 (two) times daily.   Yes [provider]  Multiple Vitamin (MULTIVITAMIN) tablet Take 1 tablet by mouth daily.   Yes [provider]  Omega-3 Fatty Acids (FISH OIL) 500 MG CAPS Take 500 mg by mouth daily.   Yes [provider]  OVER THE COUNTER MEDICATION Take 1 scoop by mouth daily. Vital Reds concentrated polyphenol blend - dietary supplement - mix 1 scoop in liquid and drink   Yes [provider]  oxybutynin (DITROPAN) 5 MG tablet Take 5 mg by mouth daily.   Yes [provider]  pantoprazole (PROTONIX) 40 MG tablet Take 40 mg by mouth daily.   Yes [provider]  PRESCRIPTION MEDICATION Inhale into the lungs at bedtime. CPAP   Yes [provider]  Psyllium (METAMUCIL PO) Take 10 mLs by mouth daily. Mix 2 teaspoonsful (10 mls) in liquid and drink   Yes [provider]  sertraline (ZOLOFT) 100 MG tablet Take 150 mg by mouth daily.   Yes [provider]  traMADol (ULTRAM) 50 MG tablet Take 50 mg by mouth See admin instructions. Take 1 tablet (50 mg) by mouth every morning, may also take 1 tablet (50 mg) in the evening as needed for fibromyalgia pain   Yes [provider]  Turmeric Curcumin 500 MG CAPS Take 500 mg by mouth at bedtime.   Yes [provider]  valsartan-hydrochlorothiazide (DIOVAN-HCT) 160-25 MG tablet Take 1 tablet by mouth daily.   Yes [provider]  aspirin 81 MG chewable tablet Chew 1 tablet (81 mg total) by mouth daily. Patient not taking: Reported on 08/21/2018 04/14/16   Adrian Prows, MD    Allergies:    Allergies  Allergen Reactions  . Lisinopril Cough  . Beta Adrenergic  Blockers Other (See Comments)    Bradycardia - bottomed out heart rate   . Lactose Intolerance (Gi) Diarrhea    cramps  . Norvasc [Amlodipine] Other (See Comments)    HEADACHE     Social History:   Social History   Socioeconomic History  . Marital status: Single    Spouse name: Not on file  . Number of children: Not on file  . Years of education: Not on file  . Highest education level: Not on file  Occupational History  . Not on file  Social Needs  . Financial resource strain: Not on file  . Food insecurity:    Worry: Not on file    Inability: Not on file  . Transportation needs:    Medical: Not on file    Non-medical: Not on file  Tobacco Use  . Smoking status: Never Smoker  . Smokeless tobacco: Never Used  Substance and Sexual Activity  . Alcohol use: No    Alcohol/week: 0.0 standard drinks  . Drug use: No  . Sexual activity: Not on file  Lifestyle  . Physical activity:    Days per week: Not on file    Minutes per session: Not on file  . Stress: Not on file    Relationships  . Social connections:    Talks on phone: Not on file    Gets together: Not on file    Attends religious service: Not on file    Active member of club or organization: Not on file    Attends meetings of clubs or organizations: Not on file    Relationship status: Not on file  . Intimate partner violence:    Fear of current or ex partner: Not on file    Emotionally abused: Not on file    Physically abused: Not on file    Forced sexual activity: Not on file  Other Topics Concern  . Not on file  Social History Narrative  . Not on file    Family History:   The patient's family history includes Heart attack in her father; Hypertension in her daughter, father, mother, sister, and son; Pulmonary disease in her father; Thyroid disease in her mother and sister.  ROS:  Please see the history of present illness.  All other ROS reviewed and negative.     Physical Exam/Data:   Vitals:   09/02/18 1003 09/02/18 1011 09/02/18 1026 09/02/18 1041  BP: 113/63 126/73 118/70 127/69  Pulse: 80 81 82 81  Resp: 15 18 16  (!) 21  Temp:      SpO2: 98% 97% 96% 96%  Weight:      Height:        Intake/Output Summary (Last 24 hours) at 09/02/2018 1239 Last data filed at 09/02/2018 0955 Gross per 24 hour  Intake 1700 ml  Output 150 ml  Net 1550 ml   Filed Weights   08/21/18 1500 09/02/18 0544  Weight: 70.6 kg 67.1 kg   Body mass index is 26.22 kg/m.  General: Well developed, well nourished WF, in no acute distress. Head: Normocephalic, atraumatic, sclera non-icteric, no xanthomas, nares are without discharge Neck: Negative for carotid bruits. JVD not elevated. Lungs: Clear bilaterally to auscultation without wheezes, rales, or rhonchi. Breathing is unlabored. Heart: RRR with S1 S2. No murmurs, rubs, or gallops appreciated. Abdomen: Soft, non-tender, non-distended with normoactive bowel sounds. No hepatomegaly. No rebound/guarding. No obvious abdominal masses. Msk:  Strength  and tone appear normal for age. Extremities: No clubbing or cyanosis.  No edema.  Distal pedal pulses are 2+ and equal bilaterally. RLE wrapped due to recent knee surgery. Neuro: Alert and oriented X 3. No facial asymmetry. No focal deficit. Moves all extremities spontaneously. Psych:  Responds to questions appropriately with a normal affect.  EKG:  The EKG was personally reviewed:  First 2 tracings did not cross over into Epic. 11:17: NSR with first degree AVB HR 83bpm, LBBB with inferior TWI and V5-V6 11:18: NSR with first degree AVB HR 86bpm with occ PVC (RBBB morphology in V1) with LBBB beats at end of tracing, obscuring interpretation of V4-V6 12:26: NSR with first degree AVB, HR 83 without acute ischemic changes, QTc 589ms 12:27: similar to 11:17  Relevant CV Studies: none  Laboratory Data:  Chemistry Recent Labs  Lab 08/29/18 1426  NA 137  K 3.8  CL 103  CO2 23  GLUCOSE 115*  BUN 26*  CREATININE 1.38*  CALCIUM 9.5  GFRNONAA 37*  GFRAA 43*  ANIONGAP 11    Recent Labs  Lab 08/29/18 1426  PROT 7.8  ALBUMIN 4.5  AST 24  ALT 20  ALKPHOS 62  BILITOT 0.7   Hematology Recent Labs  Lab 08/29/18 1426  WBC 7.0  RBC 5.33*  HGB 14.0  HCT 45.5  MCV 85.4  MCH 26.3  MCHC 30.8  RDW 13.2  PLT 348   Cardiac EnzymesNo results for input(s): TROPONINI in the last 168 hours. No results for input(s): TROPIPOC in the last 168 hours.  BNPNo results for input(s): BNP, PROBNP in the last 168 hours.  DDimer No results for input(s): DDIMER in the last 168 hours.  Radiology/Studies:  No results found.  Assessment and Plan:   1. Intermittent LBBB with baseline 1st degree AV block, also brief 2nd degree AV block type 1 - patient already has proven to have some degree of conduction disease as evidenced in 2017. Patient was seen acutely by myself and Dr. Sallyanne Kuster. LBBB does not appear to be rate related as this occurred independent of rate. EKG while in NSR did not show any  acute ST changes aside from QT prolongation, so will continue to cycle troponins and get echocardiogram. Follow on telemetry. Check TSH. Resume ASA when felt safe by surgical team. Marcaine can theoretically cause heart block and ventricular arrhythmias so it is possible this could have contributed to her post-op arrhythmias.  2. Accelerated idioventricular rhythm - associated with episodes of lightheadedness. Interestingly patient had previously had episodes of weakness like this as OP. This has resolved without acute intervention. There is a chance this could have been reperfusion related so will follow troponins and echocardiogram. Avoid BB given h/o bradycardia.  3. Prolonged QTc - will update lytes. Per d/w Dr. Sallyanne Kuster, OK to cautiously continue home meds but will follow for now. Would avoid additional QT prolonging agents at this time - will dc Zofran, phenergan, benadryl off PRN meds for now as we would prefer to re-eval QTc before these are administered. F/u EKG in AM.  4. Post op day #0 from R knee replacement, being managed by ortho.  For questions or updates, please contact [place] Please consult [email or web address] for contact info under Cardiology/STEMI.    Signed, Charlie Pitter, PA-C  09/02/2018 12:39 PM   I have seen and examined the patient along with Charlie Pitter, PA-C .  I have reviewed the chart, notes and new data.  I agree with PA/NP's note.  Key new complaints: epigastric discomfort persists as a mild nagging  sensation. Also has a "funny feeling" in her head Key examination changes: normal CV exam  Key new findings / data: ECG shows sinus rhythm with first degree AV block and transient (rate related) LBBB, periods of second degree Mobitz I AV block, without significant bradycardia. There are no ischemic repolarization changes during SR with narrow QRS, but there is moderate QT prolongation. In addition, on telemetry there is wide complex (RBBB morphology) accelerated  idioventricular rhythm that competes with NSR. Troponin (early) is normal.  PLAN: No overt signs of ischemia other than long QT. Avoid heparin IV with very recent surgery. Cycle troponin. Check echo. If these remain normal, consider outpatient event monitor and ischemic workup ([place] Myoview or coronary CTA).  If there is an obvious wall motion abnormality on echo or a meaningful troponin elevation, consider inpatient workup (cardiac cath).  Sanda Klein, MD, [place] [phone] 09/02/2018, 1:40 PM

## 2018-09-02 NOTE — Progress Notes (Signed)
Orthopedic Tech Progress Note Patient Details:  Susan Hicks [birth year] [id number]  Ortho Devices Type of Ortho Device: Bone foam zero knee Ortho Device/Splint Interventions: Ordered   Post Interventions Patient Tolerated: Well Instructions Provided: Care of device, Adjustment of device   Karolee Stamps 09/02/2018, 10:27 AM

## 2018-09-02 NOTE — Anesthesia Postprocedure Evaluation (Signed)
Anesthesia Post Note  Patient: Susan Hicks  Procedure(s) Performed: TOTAL KNEE ARTHROPLASTY (Right )     Patient location during evaluation: PACU Anesthesia Type: Spinal Level of consciousness: oriented and awake and alert Pain management: pain level controlled Vital Signs Assessment: post-procedure vital signs reviewed and stable Respiratory status: spontaneous breathing, respiratory function stable and patient connected to nasal cannula oxygen Cardiovascular status: blood pressure returned to baseline and stable Postop Assessment: no headache, no backache and no apparent nausea or vomiting Anesthetic complications: no    Last Vitals:  Vitals:   09/02/18 1333 09/02/18 1334  BP:  117/68  Pulse:  88  Resp:  20  Temp: 36.6 C   SpO2:  94%    Last Pain:  Vitals:   09/02/18 1333  TempSrc: Oral  PainSc:                  Winton Offord DAVID

## 2018-09-02 NOTE — Progress Notes (Signed)
Orthopedic Tech Progress Note Patient Details:  Susan Hicks [birth year] [id number]  CPM Right Knee CPM Right Knee: On Right Knee Flexion (Degrees): 90 Right Knee Extension (Degrees): 0  Post Interventions Patient Tolerated: Well Instructions Provided: Care of device, Adjustment of device  Karolee Stamps 09/02/2018, 10:10 AM

## 2018-09-02 NOTE — Progress Notes (Signed)
  Echocardiogram 2D Echocardiogram has been performed.  Susan Hicks 09/02/2018, 5:42 PM

## 2018-09-02 NOTE — Transfer of Care (Signed)
Immediate Anesthesia Transfer of Care Note  Patient: Susan Hicks  Procedure(s) Performed: TOTAL KNEE ARTHROPLASTY (Right )  Patient Location: PACU  Anesthesia Type:Spinal  Level of Consciousness: awake, alert , oriented and patient cooperative  Airway & Oxygen Therapy: Patient Spontanous Breathing and Patient connected to nasal cannula oxygen  Post-op Assessment: Report given to RN and Post -op Vital signs reviewed and stable  Post vital signs: Reviewed and stable  Last Vitals:  Vitals Value Taken Time  BP 151/121 09/02/2018  9:11 AM  Temp    Pulse 80 09/02/2018  9:12 AM  Resp 14 09/02/2018  9:12 AM  SpO2 100 % 09/02/2018  9:12 AM  Vitals shown include unvalidated device data.  Last Pain:  Vitals:   09/02/18 0603  PainSc: 0-No pain         Complications: No apparent anesthesia complications

## 2018-09-02 NOTE — Progress Notes (Signed)
Mg 1.5 -> will give 2g mag sulfate. Level ordered for AM. Melina Copa PA-C

## 2018-09-02 NOTE — Anesthesia Procedure Notes (Signed)
Procedure Name: MAC Date/Time: 09/02/2018 7:19 AM Performed by: Renato Shin, CRNA Pre-anesthesia Checklist: Patient identified, Emergency Drugs available, Suction available and Patient being monitored Patient Re-evaluated:Patient Re-evaluated prior to induction Oxygen Delivery Method: Nasal cannula Preoxygenation: Pre-oxygenation with 100% oxygen Induction Type: IV induction Placement Confirmation: positive ETCO2 and breath sounds checked- equal and bilateral Dental Injury: Teeth and Oropharynx as per pre-operative assessment

## 2018-09-02 NOTE — Care Plan (Signed)
Ortho Bundle Case Management Note  Patient Details  Name: Susan Hicks MRN: [id number] Date of Birth: [birth year]     Spoke with patient prior to surgery. She will discharge to home with family and HHPT as arranged. She has all needed equipment at home                DME Arranged:    DME Agency:     [place]:  PT [place]:  [facility] ([facility])  Additional Comments: Please contact me with any questions of if this plan should need to change.  Ladell Heads,  [place] [facility]  [phone] 09/02/2018, 10:19 AM

## 2018-09-02 NOTE — Op Note (Signed)
MRN:     [id number] DOB/AGE:    [birth year] / 72 y.o.       OPERATIVE REPORT   DATE OF PROCEDURE:  09/02/2018      PREOPERATIVE DIAGNOSIS:   Primary Localized Osteoarthritis right Knee       Estimated body mass index is 26.22 kg/m as calculated from the following:   Height as of this encounter: 5\' 3"  (1.6 m).   Weight as of this encounter: 67.1 kg.                                                       POSTOPERATIVE DIAGNOSIS:   Same                                                                 PROCEDURE:  Procedure(s): TOTAL KNEE ARTHROPLASTY Using Depuy Attune RP implants #4 Femur, #4Tibia, 53mm  RP bearing, 32 Patella    SURGEON: Maloni Musleh A. Noemi Chapel, MD   ASSISTANT: Matthew Saras, PA-C, present and scrubbed throughout the case, critical for retraction, instrumentation, and closure.  ANESTHESIA: Spinal with Adductor Nerve Block  TOURNIQUET TIME: 55 minutes   COMPLICATIONS:  None       SPECIMENS: None   INDICATIONS FOR PROCEDURE: The patient has djd of the knee with varus deformities, XR shows bone on bone arthritis. Patient has failed all conservative measures including anti-inflammatory medicines, narcotics, attempts at exercise and weight loss, cortisone injections and viscosupplementation.  Risks and benefits of surgery have been discussed, questions answered.    DESCRIPTION OF PROCEDURE: The patient identified by armband, received right adductor canal block and IV antibiotics, in the holding area at [facility]. Patient taken to the operating room, appropriate anesthetic monitors were attached. Spinal anesthesia induced with the patient in supine position, Foley catheter was inserted. Tourniquet applied high to the operative thigh. Lateral post and foot positioner applied to the table, the lower extremity was then prepped and draped in usual sterile fashion from the ankle to the tourniquet. Time-out procedure was performed. The limb was wrapped with an Esmarch bandage  and the tourniquet inflated to 365 mmHg.   We began the operation by making a 6cm anterior midline incision. Small bleeders in the skin and the subcutaneous tissue identified and cauterized. Transverse retinaculum was incised and reflected medially and a medial parapatellar arthrotomy was accomplished. the patella was everted and theprepatellar fat pad resected. The superficial medial collateral ligament was then elevated from anterior to posterior along the proximal flare of the tibia and anterior half of the menisci resected. The knee was hyperflexed exposing bone on bone arthritis. Peripheral and notch osteophytes as well as the cruciate ligaments were then resected. We continued to work our way around posteriorly along the proximal tibia, and externally rotated the tibia subluxing it out from underneath the femur. A McHale retractor was placed through the notch and a lateral Hohmann retractor placed, and an external tibial guide was placed.  The tibial cutting guide was pinned into place allowing resection of 6 mm of bone medially and about 2 mm of bone laterally because of her  valgus deformity.   Satisfied with the tibial resection, we then entered the distal femur 2 mm anterior to the PCL origin with the intramedullary guide rod and applied the distal femoral cutting guide set at 51mm, with 5 degrees of valgus. This was pinned along the epicondylar axis. At this point, the distal femoral cut was accomplished without difficulty. We then sized for a 4 femoral component and pinned the guide in 3 degrees of external rotation.The chamfer cutting guide was pinned into place. The anterior, posterior, and chamfer cuts were accomplished without difficulty followed by the  RP box cutting guide and the box cut. We also removed posterior osteophytes from the posterior femoral condyles. At this time, the knee was brought into full extension. We checked our extension and flexion gaps and found them symmetric at  5.  The patella thickness measured at 66m m. We set the cutting guide at 15 and removed the posterior patella sized for 32 button and drilled the lollipop. The knee was then once again hyperflexed exposing the proximal tibia. We sized for a # 4 tibial base plate, applied the smokestack and the conical reamer followed by the the Delta fin keel punch. We then hammered into place the  RP trial femoral component, inserted a trial bearing, trial patellar button, and took the knee through range of motion from 0-130 degrees. No thumb pressure was required for patellar tracking.   At this point, all trial components were removed, a double batch of DePuy HV cement  was mixed and applied to all bony metallic mating surfaces. In order, we hammered into place the tibial tray and removed excess cement, the femoral component and removed excess cement, a 5 mm  RP bearing was inserted, and the knee brought to full extension with compression. The patellar button was clamped into place, and excess cement removed. While the cement cured the wound was irrigated out with normal saline solution pulse lavage, and exparel was injected throughout the knee. Ligament stability and patellar tracking were checked and found to be excellent..   The parapatellar arthrotomy was closed with  #1 Vicryl suture. The subcutaneous tissue with 0 and 2-0 undyed Vicryl suture, and 4-0 Monocryl.. A dressing of Aquaseal, 4 x 4, dressing sponges, Webril, and Ace wrap applied. Needle and sponge count were correct times 2.The patient awakened, extubated, and taken to recovery room without difficulty. Vascular status was normal, pulses 2+ and symmetric.    Lorn Junes 01/07/2018, 8:56 AM

## 2018-09-02 NOTE — Plan of Care (Signed)

## 2018-09-02 NOTE — Interval H&P Note (Signed)
History and Physical Interval Note:  09/02/2018 6:22 AM  Susan Hicks  has presented today for surgery, with the diagnosis of djd right knee  The various methods of treatment have been discussed with the patient and family. After consideration of risks, benefits and other options for treatment, the patient has consented to  Procedure(s): TOTAL KNEE ARTHROPLASTY (Right) as a surgical intervention .  The patient's history has been reviewed, patient examined, no change in status, stable for surgery.  I have reviewed the patient's chart and labs.  Questions were answered to the patient's satisfaction.     Lorn Junes

## 2018-09-03 ENCOUNTER — Encounter (HOSPITAL_COMMUNITY): Payer: Self-pay | Admitting: Orthopedic Surgery

## 2018-09-03 DIAGNOSIS — I97191 Other postprocedural cardiac functional disturbances following other surgery: Secondary | ICD-10-CM | POA: Diagnosis not present

## 2018-09-03 DIAGNOSIS — I441 Atrioventricular block, second degree: Secondary | ICD-10-CM | POA: Diagnosis not present

## 2018-09-03 DIAGNOSIS — Z7982 Long term (current) use of aspirin: Secondary | ICD-10-CM | POA: Diagnosis not present

## 2018-09-03 DIAGNOSIS — M1711 Unilateral primary osteoarthritis, right knee: Principal | ICD-10-CM

## 2018-09-03 DIAGNOSIS — Z9049 Acquired absence of other specified parts of digestive tract: Secondary | ICD-10-CM | POA: Diagnosis not present

## 2018-09-03 DIAGNOSIS — Y838 Other surgical procedures as the cause of abnormal reaction of the patient, or of later complication, without mention of misadventure at the time of the procedure: Secondary | ICD-10-CM | POA: Diagnosis not present

## 2018-09-03 DIAGNOSIS — I10 Essential (primary) hypertension: Secondary | ICD-10-CM | POA: Diagnosis not present

## 2018-09-03 DIAGNOSIS — Z79899 Other long term (current) drug therapy: Secondary | ICD-10-CM | POA: Diagnosis not present

## 2018-09-03 DIAGNOSIS — Z888 Allergy status to other drugs, medicaments and biological substances status: Secondary | ICD-10-CM | POA: Diagnosis not present

## 2018-09-03 DIAGNOSIS — G4733 Obstructive sleep apnea (adult) (pediatric): Secondary | ICD-10-CM | POA: Diagnosis not present

## 2018-09-03 DIAGNOSIS — M797 Fibromyalgia: Secondary | ICD-10-CM | POA: Diagnosis not present

## 2018-09-03 DIAGNOSIS — F411 Generalized anxiety disorder: Secondary | ICD-10-CM | POA: Diagnosis not present

## 2018-09-03 DIAGNOSIS — K589 Irritable bowel syndrome without diarrhea: Secondary | ICD-10-CM | POA: Diagnosis not present

## 2018-09-03 DIAGNOSIS — I491 Atrial premature depolarization: Secondary | ICD-10-CM | POA: Diagnosis present

## 2018-09-03 DIAGNOSIS — I452 Bifascicular block: Secondary | ICD-10-CM | POA: Diagnosis not present

## 2018-09-03 DIAGNOSIS — K219 Gastro-esophageal reflux disease without esophagitis: Secondary | ICD-10-CM | POA: Diagnosis not present

## 2018-09-03 DIAGNOSIS — Z791 Long term (current) use of non-steroidal anti-inflammatories (NSAID): Secondary | ICD-10-CM | POA: Diagnosis not present

## 2018-09-03 DIAGNOSIS — M21061 Valgus deformity, not elsewhere classified, right knee: Secondary | ICD-10-CM | POA: Diagnosis present

## 2018-09-03 DIAGNOSIS — Y92239 Unspecified place in hospital as the place of occurrence of the external cause: Secondary | ICD-10-CM | POA: Diagnosis not present

## 2018-09-03 DIAGNOSIS — Z79890 Hormone replacement therapy (postmenopausal): Secondary | ICD-10-CM | POA: Diagnosis not present

## 2018-09-03 DIAGNOSIS — Z8349 Family history of other endocrine, nutritional and metabolic diseases: Secondary | ICD-10-CM | POA: Diagnosis not present

## 2018-09-03 DIAGNOSIS — F419 Anxiety disorder, unspecified: Secondary | ICD-10-CM | POA: Diagnosis present

## 2018-09-03 DIAGNOSIS — M810 Age-related osteoporosis without current pathological fracture: Secondary | ICD-10-CM | POA: Diagnosis present

## 2018-09-03 DIAGNOSIS — I498 Other specified cardiac arrhythmias: Secondary | ICD-10-CM | POA: Diagnosis not present

## 2018-09-03 DIAGNOSIS — F329 Major depressive disorder, single episode, unspecified: Secondary | ICD-10-CM | POA: Diagnosis present

## 2018-09-03 DIAGNOSIS — Z8249 Family history of ischemic heart disease and other diseases of the circulatory system: Secondary | ICD-10-CM | POA: Diagnosis not present

## 2018-09-03 LAB — BASIC METABOLIC PANEL
ANION GAP: 8 (ref 5–15)
BUN: 18 mg/dL (ref 8–23)
CALCIUM: 7.8 mg/dL — AB (ref 8.9–10.3)
CHLORIDE: 107 mmol/L (ref 98–111)
CO2: 22 mmol/L (ref 22–32)
Creatinine, Ser: 1.15 mg/dL — ABNORMAL HIGH (ref 0.44–1.00)
GFR calc Af Amer: 54 mL/min — ABNORMAL LOW (ref >60)
GFR calc non Af Amer: 47 mL/min — ABNORMAL LOW (ref >60)
Glucose, Bld: 130 mg/dL — ABNORMAL HIGH (ref 70–99)
POTASSIUM: 4.8 mmol/L (ref 3.5–5.1)
Sodium: 137 mmol/L (ref 135–145)

## 2018-09-03 LAB — URINE CULTURE: CULTURE: NO GROWTH

## 2018-09-03 LAB — ECHOCARDIOGRAM COMPLETE
Height: 63 in
Weight: 2368 oz

## 2018-09-03 LAB — CBC
HEMATOCRIT: 28.8 % — AB (ref 36.0–46.0)
HEMOGLOBIN: 9.4 g/dL — AB (ref 12.0–15.0)
MCH: 27.3 pg (ref 26.0–34.0)
MCHC: 32.6 g/dL (ref 30.0–36.0)
MCV: 83.7 fL (ref 80.0–100.0)
NRBC: 0 % (ref 0.0–0.2)
Platelets: 216 10*3/uL (ref 150–400)
RBC: 3.44 MIL/uL — AB (ref 3.87–5.11)
RDW: 13.2 % (ref 11.5–15.5)
WBC: 7.5 10*3/uL (ref 4.0–10.5)

## 2018-09-03 LAB — MAGNESIUM: MAGNESIUM: 1.9 mg/dL (ref 1.7–2.4)

## 2018-09-03 LAB — TROPONIN I: Troponin I: <0.03 ng/mL (ref <0.03)

## 2018-09-03 MED ORDER — HYDROCODONE-ACETAMINOPHEN 5-325 MG PO TABS
1.0000 | ORAL_TABLET | ORAL | Status: DC | PRN
  Administered 2018-09-03 – 2018-09-04 (×6): 2 via ORAL
  Filled 2018-09-03 (×6): qty 2

## 2018-09-03 MED ORDER — SODIUM CHLORIDE 0.9 % IV BOLUS
500.0000 mL | Freq: Once | INTRAVENOUS | Status: AC
  Administered 2018-09-03: 500 mL via INTRAVENOUS

## 2018-09-03 NOTE — Care Management (Signed)
Ortho Bundled patient. [place] prearranged.

## 2018-09-03 NOTE — Evaluation (Addendum)
Physical Therapy Evaluation Patient Details Name: Susan Hicks MRN: [id number] DOB: [birth year] Today's Date: 09/03/2018   History of Present Illness  Pt is 72 y.o. s/p R TKA (09/02/18) secondary to osteoarthritis. PMH significant for osteoporosis, obstructive sleep apnea with use of CPAP, GERD, irritable bowel, HTN, anxiety, fibromyalgia, and bradycardia.  Clinical Impression  PTA pt was independent with daily activities without use of AD, lived alone, and enjoys gardening. Pt's granddaughter was present for treatment session and reports she will be staying with her upon DC. Pt reported increased pain at start of treatment and had been up ambulating long distances in the hall earlier this morning. She completed all mobility hands on min guard for safety, with vc for management of RW. Education provided on HEP and importance of mobility for healing and pain management. Skilled therapy is necessary to address RLE deficits in ROM, strength, and balance to return to PLOF. PT recommending HHPT at DC for patient to return to independence with ADLs. PT will continue to follow acutely.     Follow Up Recommendations Follow surgeon's recommendation for DC plan and follow-up therapies;Supervision for mobility/OOB    Equipment Recommendations  Rolling walker with 5" wheels(youth walker)       Precautions / Restrictions Precautions Precautions: Fall Restrictions Weight Bearing Restrictions: Yes RLE Weight Bearing: Weight bearing as tolerated      Mobility  Bed Mobility               General bed mobility comments: sitting in chair at start of session  Transfers Overall transfer level: Needs assistance Equipment used: Rolling walker (2 wheeled) Transfers: Sit to/from Stand Sit to Stand: Min guard         General transfer comment: hands on min guard for safety, cuing to move RW closer to her to stand.  Ambulation/Gait Ambulation/Gait assistance: Min guard Gait Distance  (Feet): 185 Feet Assistive device: Rolling walker (2 wheeled) Gait Pattern/deviations: Step-through pattern;Decreased stance time - right   Gait velocity interpretation: >2.62 ft/sec, indicative of community ambulatory General Gait Details: hands on min guard for safety; vc to keep RW closer, move slower, and decrease tension through neck and shoulders with use of RW. Pt ambulated to bathroom and in the hall, reporting she walked the full loop on the hall earlier this morning.       Balance Overall balance assessment: Needs assistance Sitting-balance support: Feet supported Sitting balance-Leahy Scale: Good     Standing balance support: No upper extremity supported;During functional activity Standing balance-Leahy Scale: Fair Standing balance comment: pt able to adjust clothing in standing without holding RW.                              Pertinent Vitals/Pain Pain Assessment: Faces Faces Pain Scale: Hurts whole lot Pain Location: right knee Pain Descriptors / Indicators: Burning;Grimacing;Guarding;Sharp;Tightness Pain Intervention(s): Limited activity within patient's tolerance;Monitored during session;Premedicated before session    [place] expects to be discharged to:: Private residence Living Arrangements: Alone Available Help at Discharge: Family;Available 24 hours/day Type of Home: House Home Access: Level entry     Home Layout: One level Home Equipment: Walker - 2 wheels;Bedside commode;Grab bars - tub/shower(husband's walker) Additional Comments: granddaughter will be staying with her    Prior Function Level of Independence: Independent         Comments: enjoys gardening        Extremity/Trunk Assessment   Upper Extremity Assessment Upper Extremity  Assessment: Overall WFL for tasks assessed    Lower Extremity Assessment Lower Extremity Assessment: RLE deficits/detail RLE Deficits / Details: s/p right TKA    Cervical /  Trunk Assessment Cervical / Trunk Assessment: Normal  Communication   Communication: No difficulties  Cognition Arousal/Alertness: Awake/alert Behavior During Therapy: WFL for tasks assessed/performed Overall Cognitive Status: Within Functional Limits for tasks assessed                                        General Comments General comments (skin integrity, edema, etc.): Pt's granddaughter present for duration of treatment, pt sitting in chair with bone block under heel at start of treatment. Educated pt and granddaughter on continued mobility throughout the day, HEP, importance of completing AROM vs. PROM, and pain management. Both pt and granddaughter eager to learn and accept advice to optimize healing and outcome. Some edema and minimal redness under bandage present at incisional site.     Exercises Total Joint Exercises Heel Slides: AROM;Right;5 reps;Seated Long Arc Quad: AROM;5 reps;Seated  Exercises performed in decreased ROM limited by pain, edema and dressing.    Assessment/Plan    PT Assessment Patient needs continued PT services  PT Problem List Decreased strength;Decreased range of motion;Decreased activity tolerance;Decreased balance;Decreased mobility;Decreased coordination;Decreased knowledge of use of DME;Decreased safety awareness;Pain       PT Treatment Interventions DME instruction;Gait training;Functional mobility training;Therapeutic activities;Therapeutic exercise;Balance training;Neuromuscular re-education;Patient/family education    PT Goals (Current goals can be found in the Care Plan section)  Acute Rehab PT Goals Patient Stated Goal: return home PT Goal Formulation: With patient/family Time For Goal Achievement: 09/17/18 Potential to Achieve Goals: Good    Frequency 7X/week    AM-PAC PT "6 Clicks" Daily Activity  Outcome Measure Difficulty turning over in bed (including adjusting bedclothes, sheets and blankets)?: A  Little Difficulty moving from lying on back to sitting on the side of the bed? : Unable Difficulty sitting down on and standing up from a chair with arms (e.g., wheelchair, bedside commode, etc,.)?: Unable Help needed moving to and from a bed to chair (including a wheelchair)?: A Little Help needed walking in hospital room?: A Little Help needed climbing 3-5 steps with a railing? : A Lot 6 Click Score: 13    End of Session Equipment Utilized During Treatment: Gait belt Activity Tolerance: Patient tolerated treatment well Patient left: in chair;with call bell/phone within reach;with family/visitor present   PT Visit Diagnosis: Other abnormalities of gait and mobility (R26.89);Muscle weakness (generalized) (M62.81);Difficulty in walking, not elsewhere classified (R26.2);Pain Pain - Right/Left: Right Pain - part of body: Knee    Time: 0900-0923 PT Time Calculation (min) (ACUTE ONLY): 23 min   Charges:   PT Evaluation $PT Eval Low Complexity: 1 Low PT Treatments $Gait Training: 8-22 mins        Vernell Morgans, SPT Acute Rehabilitation Services Office [phone]   Vernell Morgans 09/03/2018, 11:42 AM

## 2018-09-03 NOTE — Progress Notes (Signed)
Physical Therapy Treatment Patient Details Name: Susan Hicks MRN: [id number] DOB: [birth year] Today's Date: 09/03/2018    History of Present Illness Pt is 72 y.o. s/p R TKA (09/02/18) secondary to osteoarthritis. PMH significant for osteoporosis, obstructive sleep apnea with use of CPAP, GERD, irritable bowel, HTN, anxiety, fibromyalgia, and bradycardia.    PT Comments    Pt presenting with increased pain this afternoon, eager to go home. Pt able to perform bed mobility min guard, and all other mobility with hands on min guard for safety. Pt ambulating shorter distance this afternoon, limited by pain. Fatigue noted following ambulation, with 1/4 dyspnea. Educated on HEP in supine, bed mobility at home, and importance of pain management for mobility. DC plan remains appropriate at this time, PT will continue to follow acutely.   Follow Up Recommendations  Follow surgeon's recommendation for DC plan and follow-up therapies;Supervision for mobility/OOB     Equipment Recommendations  Rolling walker with 5" wheels(youth walker)       Precautions / Restrictions Precautions Precautions: Fall Restrictions Weight Bearing Restrictions: Yes RLE Weight Bearing: Weight bearing as tolerated    Mobility  Bed Mobility Overal bed mobility: Needs Assistance Bed Mobility: (P) Supine to Sit;Sit to Supine     Supine to sit: Min guard;HOB elevated Sit to supine: (P) Min guard;HOB elevated   General bed mobility comments: pt able to complete bed mobility without use of bedrail, min guard for safety, and HOB slightly elevated.   Transfers Overall transfer level: Needs assistance Equipment used: Rolling walker (2 wheeled) Transfers: Sit to/from Stand Sit to Stand: Min guard         General transfer comment: hands on min guard for safety, pt has decreased weight bear through RLE during transfer  Ambulation/Gait Ambulation/Gait assistance: Min guard Gait Distance (Feet): 110  Feet Assistive device: Rolling walker (2 wheeled) Gait Pattern/deviations: Step-through pattern;Decreased stance time - right;Narrow base of support   Gait velocity interpretation: 1.31 - 2.62 ft/sec, indicative of limited community ambulator General Gait Details: hands on min guard for safety; improved management of RW during ambulation, cuing to relax shoulders with use of RW. Pt ambulating slower this afternoon, limited by pain.        Balance Overall balance assessment: Needs assistance Sitting-balance support: Feet supported Sitting balance-Leahy Scale: Good     Standing balance support: No upper extremity supported;During functional activity Standing balance-Leahy Scale: Fair                              Cognition Arousal/Alertness: Awake/alert Behavior During Therapy: WFL for tasks assessed/performed Overall Cognitive Status: Within Functional Limits for tasks assessed                                        Exercises Total Joint Exercises Ankle Circles/Pumps: AROM;Both;5 reps;Supine Quad Sets: AROM;Right;5 reps;Supine Heel Slides: AROM;Right;5 reps;Supine Hip ABduction/ADduction: AROM;Right;10 reps;Supine Straight Leg Raises: AROM;Right;5 reps;Supine Goniometric ROM: Knee flexion: 90 deg; Knee extension: 18 deg  Pt completed exercises through decreased ROM, limited by pain, edema, and dressing. Vc provided for proper form, slower, more controlled movement, and moving through tolerable ROM.     General Comments General comments (skin integrity, edema, etc.): Pt's daughter in room for duration of session, some edema still present through mid-calf, with bruising noted on posterior aspect of knee. Pt educated on HEP in  supine, importance of pain management and completing mobility in a tolerable range. Also instructed pt to refrain from crossing RLE over her left when laying in bed, as this is creating a flexed resting position, limiting knee  extension.      Pertinent Vitals/Pain Pain Assessment: Faces Faces Pain Scale: Hurts whole lot Pain Location: right knee Pain Descriptors / Indicators: Burning;Grimacing;Guarding;Sharp;Tightness Pain Intervention(s): Limited activity within patient's tolerance;Monitored during session;Patient requesting pain meds-RN notified;Repositioned           PT Goals (current goals can now be found in the care plan section) Acute Rehab PT Goals Patient Stated Goal: return home PT Goal Formulation: With patient/family Time For Goal Achievement: 09/17/18 Potential to Achieve Goals: Good Progress towards PT goals: Progressing toward goals    Frequency    7X/week      PT Plan Current plan remains appropriate       AM-PAC PT "6 Clicks" Daily Activity  Outcome Measure  Difficulty turning over in bed (including adjusting bedclothes, sheets and blankets)?: A Little Difficulty moving from lying on back to sitting on the side of the bed? : A Lot Difficulty sitting down on and standing up from a chair with arms (e.g., wheelchair, bedside commode, etc,.)?: Unable Help needed moving to and from a bed to chair (including a wheelchair)?: A Little Help needed walking in hospital room?: A Little Help needed climbing 3-5 steps with a railing? : A Lot 6 Click Score: 14    End of Session Equipment Utilized During Treatment: Gait belt Activity Tolerance: Patient tolerated treatment well Patient left: in chair;with call bell/phone within reach;with family/visitor present Nurse Communication: Patient requests pain meds PT Visit Diagnosis: Other abnormalities of gait and mobility (R26.89);Muscle weakness (generalized) (M62.81);Difficulty in walking, not elsewhere classified (R26.2);Pain Pain - Right/Left: Right Pain - part of body: Knee     Time: 8032-1224 PT Time Calculation (min) (ACUTE ONLY): 21 min  Charges:  $Therapeutic Exercise: 8-22 mins                     Vernell Morgans,  SPT Acute Rehabilitation Services Office [phone]    Vernell Morgans 09/03/2018, 4:47 PM

## 2018-09-03 NOTE — Progress Notes (Signed)
RT placed patient on CPAP HS auto 45max and 84min. Patient tolerating well at this time.

## 2018-09-03 NOTE — Progress Notes (Signed)
Subjective: 1 Day Post-Op Procedure(s) (LRB): TOTAL KNEE ARTHROPLASTY (Right) Patient reports pain as 6 on 0-10 scale.    Objective: Vital signs in last 24 hours: Temp:  [97.7 F (36.5 C)-98.9 F (37.2 C)] 97.7 F (36.5 C) (11/19 0348) Pulse Rate:  [71-89] 71 (11/19 0348) Resp:  [9-21] 16 (11/19 0348) BP: (105-157)/(63-91) 135/70 (11/19 0348) SpO2:  [94 %-100 %] 97 % (11/19 0348)  Intake/Output from previous day: 11/18 0701 - 11/19 0700 In: 1940 [P.O.:440; I.V.:1500] Out: 1600 [Urine:1575; Blood:25] Intake/Output this shift: No intake/output data recorded.  Recent Labs    09/02/18 1243 09/03/18 0139  HGB 10.6* 9.4*   Recent Labs    09/02/18 1243 09/03/18 0139  WBC 9.0 7.5  RBC 4.02 3.44*  HCT 34.2* 28.8*  PLT 255 216   Recent Labs    09/02/18 1243 09/03/18 0139  NA 138 137  K 4.3 4.8  CL 108 107  CO2 25 22  BUN 17 18  CREATININE 1.10* 1.15*  GLUCOSE 138* 130*  CALCIUM 8.2* 7.8*   No results for input(s): LABPT, INR in the last 72 hours.  ABD soft Neurovascular intact Sensation intact distally Intact pulses distally Dorsiflexion/Plantar flexion intact Incision: scant drainage  Anticipated LOS equal to or greater than 2 midnights due to - Age 72 and older with one or more of the following:  - Obesity  - Expected need for hospital services (PT, OT, Nursing) required for safe  discharge  - Anticipated need for postoperative skilled nursing care or inpatient rehab  - Active co-morbidities: None OR   - Unanticipated findings during/Post Surgery: Lab abnormalities and Slow post-op progression: GI, pain control, mobility  - Patient is a high risk of re-admission due to: None   Assessment/Plan: 1 Day Post-Op Procedure(s) (LRB): TOTAL KNEE ARTHROPLASTY (Right)  Principal Problem:   Primary localized osteoarthritis of right knee Active Problems:   Generalized anxiety disorder   Irritable bowel   Hypertension   GERD (gastroesophageal reflux  disease)   Fibromyalgia   Obstructive sleep apnea treated with continuous positive airway pressure (CPAP)   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  She requested hydrocodone instead of oxycodone.  Meds switched.  Still has dilaudid for uncontrolled pain.  Given fluid bolus this am and will give another in the PM.  Will watch but expect discharge in am instead of today because of cardiac arrhythmia post op. Advance diet Up with therapy    Linda Hedges 09/03/2018, 8:41 AM

## 2018-09-03 NOTE — Progress Notes (Signed)
Orthopedic Tech Progress Note Patient Details:  Susan Hicks May 21, 1946 [id number]  Patient ID: Susan Hicks, female   DOB: 03/08/1946, 72 y.o.   MRN: [id number] I called rn. She states pt already has knee immobilizer.  Karolee Stamps 09/03/2018, 9:12 AM

## 2018-09-03 NOTE — Progress Notes (Signed)
Progress Note  Patient Name: Susan Hicks Date of Encounter: 09/03/2018  Primary Cardiologist: Carlyle Dolly, MD   Subjective   No CV complaints - no chest discomfort, no dyspnea, no lightheadedness. 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 Narrow complex QRS - no evidence of LBBB or AIVR  Inpatient Medications    Scheduled Meds: . aspirin EC  325 mg Oral Q breakfast  . busPIRone  15 mg Oral BID  . cholecalciferol  2,000 Units Oral Daily  . dexamethasone  10 mg Intravenous Q8H  . docusate sodium  100 mg Oral BID  . gabapentin  300 mg Oral QHS  . loratadine  10 mg Oral Daily  . LORazepam  0.5 mg Oral QHS  . magnesium oxide  200 mg Oral QHS  . oxybutynin  5 mg Oral Daily  . pantoprazole  40 mg Oral Daily  . polyethylene glycol  17 g Oral BID  . polyvinyl alcohol  1 drop Both Eyes TID  . sertraline  150 mg Oral Daily   Continuous Infusions: . 0.9 % NaCl with KCl 20 mEq / L 100 mL/hr at 09/02/18 1433  . sodium chloride 500 mL (09/03/18 1241)   PRN Meds: alum & mag hydroxide-simeth, HYDROcodone-acetaminophen, HYDROmorphone (DILAUDID) injection, menthol-cetylpyridinium **OR** phenol   Vital Signs    Vitals:   09/02/18 1334 09/02/18 2001 09/03/18 0100 09/03/18 0348  BP: 117/68 125/68 (!) 149/75 135/70  Pulse: 88 82 80 71  Resp: 20 16 16 16   Temp:  98.9 F (37.2 C) 97.9 F (36.6 C) 97.7 F (36.5 C)  TempSrc:  Oral Oral Oral  SpO2: 94% 95% 97% 97%  Weight:      Height:        Intake/Output Summary (Last 24 hours) at 09/03/2018 1320 Last data filed at 09/03/2018 0700 Gross per 24 hour  Intake 240 ml  Output 950 ml  Net -710 ml   Filed Weights   08/21/18 1500 09/02/18 0544  Weight: 70.6 kg 67.1 kg    Telemetry    Sinus rhythm with occasional 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 - Personally Reviewed  ECG    NSR, 1st deg AV block - Personally Reviewed  Physical Exam    Smiling. Comfortable GEN: No acute distress.   Neck: No JVD Cardiac: RRR, no murmurs, rubs, or gallops.  Respiratory: Clear to auscultation bilaterally. GI: Soft, nontender, non-distended  MS: No edema; No deformity. Neuro:  Nonfocal  Psych: Normal affect   Labs    Chemistry Recent Labs  Lab 08/29/18 1426 09/02/18 1243 09/03/18 0139  NA 137 138 137  K 3.8 4.3 4.8  CL 103 108 107  CO2 23 25 22   GLUCOSE 115* 138* 130*  BUN 26* 17 18  CREATININE 1.38* 1.10* 1.15*  CALCIUM 9.5 8.2* 7.8*  PROT 7.8  --   --   ALBUMIN 4.5  --   --   AST 24  --   --   ALT 20  --   --   ALKPHOS 62  --   --   BILITOT 0.7  --   --   GFRNONAA 37* 49* 47*  GFRAA 43* 57* 54*  ANIONGAP 11 5 8      Hematology Recent Labs  Lab 08/29/18 1426 09/02/18 1243 09/03/18 0139  WBC 7.0 9.0 7.5  RBC 5.33* 4.02 3.44*  HGB 14.0 10.6* 9.4*  HCT 45.5 34.2* 28.8*  MCV 85.4  85.1 83.7  MCH 26.3 26.4 27.3  MCHC 30.8 31.0 32.6  RDW 13.2 13.2 13.2  PLT 348 255 216    Cardiac Enzymes Recent Labs  Lab 09/02/18 1155 09/02/18 1425 09/02/18 1901 09/03/18 0139  TROPONINI <0.03 <0.03 <0.03 <0.03   No results for input(s): TROPIPOC in the last 168 hours.   BNPNo results for input(s): BNP, PROBNP in the last 168 hours.   DDimer No results for input(s): DDIMER in the last 168 hours.   Radiology    No results found.  Cardiac Studies   ECHO 09/02/2018 - Left ventricle: The cavity size was normal. Systolic function was   normal. Wall motion was normal; there were no regional wall   motion abnormalities. Doppler parameters are consistent with   abnormal left ventricular relaxation (grade 1 diastolic   dysfunction). - Aortic valve: There was no regurgitation. - Mitral valve: Calcified annulus. Mildly thickened leaflets .   There was mild regurgitation. - Right ventricle: The cavity size was normal. Wall thickness was   normal. Systolic function was normal. - Right atrium: The atrium was normal in  size. - Tricuspid valve: There was mild regurgitation. - Pulmonary arteries: Systolic pressure was within the normal   range. - Inferior vena cava: The vessel was normal in size. - Pericardium, extracardiac: There was no pericardial effusion.  Patient Profile     72 y.o. 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  Assessment & Plan    No significant arrhythmia today other than infrequent Mobitz type 1, asymptomatic, without bradycardia. She reports a normal stress test with Dr. Nolon Rod Einar Gip?) within the last 2 years. Normal echo here.  She has AV node and intraventricular conduction abnormalities that are likely age related.Except for the immediate postop period, these are asymptomatic. Pacemaker is not indicated at this time, but should avoid beta blockers, diltiazem, verapamil and other meds with AV node blocking effects. Symptoms appeared associated with episodes of brief AIVR yesterday. No structural heart disease identified.  [facility] will sign off.   Medication Recommendations:  Avoid beta blockers, verapamil, diltiazem Other recommendations (labs, testing, etc):  n/a Follow up as an outpatient:  She plans to follow up with Dr. Woody Seller  For questions or updates, please contact [facility] Please consult [email or web address] for contact info under        Signed, Sanda Klein, MD  09/03/2018, 1:20 PM

## 2018-09-04 ENCOUNTER — Other Ambulatory Visit: Payer: Self-pay

## 2018-09-04 LAB — CBC
HEMATOCRIT: 29.9 % — AB (ref 36.0–46.0)
Hemoglobin: 9.6 g/dL — ABNORMAL LOW (ref 12.0–15.0)
MCH: 27.2 pg (ref 26.0–34.0)
MCHC: 32.1 g/dL (ref 30.0–36.0)
MCV: 84.7 fL (ref 80.0–100.0)
NRBC: 0 % (ref 0.0–0.2)
Platelets: 259 10*3/uL (ref 150–400)
RBC: 3.53 MIL/uL — AB (ref 3.87–5.11)
RDW: 14 % (ref 11.5–15.5)
WBC: 8.2 10*3/uL (ref 4.0–10.5)

## 2018-09-04 LAB — BASIC METABOLIC PANEL
ANION GAP: 8 (ref 5–15)
BUN: 15 mg/dL (ref 8–23)
CALCIUM: 8.3 mg/dL — AB (ref 8.9–10.3)
CO2: 25 mmol/L (ref 22–32)
Chloride: 105 mmol/L (ref 98–111)
Creatinine, Ser: 1.02 mg/dL — ABNORMAL HIGH (ref 0.44–1.00)
GFR, EST NON AFRICAN AMERICAN: 54 mL/min — AB (ref >60)
Glucose, Bld: 120 mg/dL — ABNORMAL HIGH (ref 70–99)
POTASSIUM: 4.6 mmol/L (ref 3.5–5.1)
Sodium: 138 mmol/L (ref 135–145)

## 2018-09-04 LAB — TROPONIN I

## 2018-09-04 MED ORDER — GABAPENTIN 300 MG PO CAPS: 300.0000 mg | ORAL_CAPSULE | Freq: Every day | ORAL | 0 refills | Status: DC

## 2018-09-04 MED ORDER — HYDROCODONE-ACETAMINOPHEN 5-325 MG PO TABS: 1.0000 | ORAL_TABLET | ORAL | 0 refills | Status: DC | PRN

## 2018-09-04 MED ORDER — POLYETHYLENE GLYCOL 3350 17 G PO PACK: PACK | ORAL | 0 refills | Status: DC

## 2018-09-04 MED ORDER — ASPIRIN 325 MG PO TBEC: DELAYED_RELEASE_TABLET | ORAL | 0 refills | Status: DC

## 2018-09-04 MED ORDER — DOCUSATE SODIUM 100 MG PO CAPS: ORAL_CAPSULE | ORAL | 0 refills | Status: DC

## 2018-09-04 NOTE — Progress Notes (Signed)
Physical Therapy Treatment Patient Details Name: Susan Hicks MRN: [id number] DOB: 02/18/1946 Today's Date: 09/04/2018    History of Present Illness Pt is 72 y.o. s/p R TKA (09/02/18) secondary to osteoarthritis. PMH significant for osteoporosis, obstructive sleep apnea with use of CPAP, GERD, irritable bowel, HTN, anxiety, fibromyalgia, and bradycardia.    PT Comments    Pt reporting increased pain, but just took pain meds, and is eager to go home. Pt educated on proper form with standing HEP, able to complete all mobility min guard for safety. Treatment session limited due to pain. Educated on pain management, and importance of AROM and proper resting positions to maintain ROM. Pt reports that she has driving arrangements made for ease of car transfer at DC. DC plan remains appropriate, pt and family with no other questions at this time.  Follow Up Recommendations  Follow surgeon's recommendation for DC plan and follow-up therapies;Supervision for mobility/OOB     Equipment Recommendations  Rolling walker with 5" wheels(youth walker)       Precautions / Restrictions Precautions Precautions: Fall Restrictions Weight Bearing Restrictions: Yes RLE Weight Bearing: Weight bearing as tolerated    Mobility   Transfers Overall transfer level: Needs assistance Equipment used: Rolling walker (2 wheeled) Transfers: Sit to/from Stand Sit to Stand: Min guard         General transfer comment: min guard for safety, pt performing transfer with greater ease and improved weight bear through RLE today.  Ambulation/Gait Ambulation/Gait assistance: Min guard Gait Distance (Feet): 5 Feet Assistive device: Rolling walker (2 wheeled) Gait Pattern/deviations: Step-through pattern;Narrow base of support;Decreased stance time - right   Gait velocity interpretation: 1.31 - 2.62 ft/sec, indicative of limited community ambulator General Gait Details: pt ambulating min guard in room, able  to maintain balance without UE a she is gathering things in her room, awaiting DC.      Balance Overall balance assessment: Needs assistance Sitting-balance support: Feet supported Sitting balance-Leahy Scale: Good     Standing balance support: No upper extremity supported;During functional activity Standing balance-Leahy Scale: Good                              Cognition Arousal/Alertness: Awake/alert Behavior During Therapy: WFL for tasks assessed/performed Overall Cognitive Status: Within Functional Limits for tasks assessed                                        Exercises Total Joint Exercises Hip ABduction/ADduction: AROM;Right;5 reps;Standing Knee Flexion: AROM;Right;5 reps;Standing Goniometric ROM: Knee Flexion: 75 deg; Knee Extension: 10 deg Standing Hip Extension: AROM;Right;5 reps;Standing All exercises completed in decreased ROM limited by edema, pain, and dressing. Verbal and tactile cuing provided for proper form with exercises.    General Comments General comments (skin integrity, edema, etc.): (P) Pt's granddaughter present for duration of session. Educated on standing HEP with proper form and pain management. Some edema still present, dressing dry and in tact at incisional site.       Pertinent Vitals/Pain Pain Assessment: 0-10 Pain Score: 6  Faces Pain Scale: Hurts even more Pain Location: right knee Pain Descriptors / Indicators: Grimacing;Sharp;Tightness Pain Intervention(s): Limited activity within patient's tolerance;Monitored during session;Repositioned           PT Goals (current goals can now be found in the care plan section) Acute Rehab PT Goals Patient Stated  Goal: return home PT Goal Formulation: With patient/family Time For Goal Achievement: 09/17/18 Potential to Achieve Goals: Good Progress towards PT goals: Progressing toward goals    Frequency    7X/week      PT Plan Current plan remains  appropriate       AM-PAC PT "6 Clicks" Daily Activity  Outcome Measure  Difficulty turning over in bed (including adjusting bedclothes, sheets and blankets)?: A Little Difficulty moving from lying on back to sitting on the side of the bed? : A Lot Difficulty sitting down on and standing up from a chair with arms (e.g., wheelchair, bedside commode, etc,.)?: Unable Help needed moving to and from a bed to chair (including a wheelchair)?: A Little Help needed walking in hospital room?: A Little Help needed climbing 3-5 steps with a railing? : A Lot 6 Click Score: 14    End of Session Equipment Utilized During Treatment: Gait belt Activity Tolerance: Patient tolerated treatment well Patient left: in chair;with call bell/phone within reach;with family/visitor present   PT Visit Diagnosis: Other abnormalities of gait and mobility (R26.89);Muscle weakness (generalized) (M62.81);Difficulty in walking, not elsewhere classified (R26.2);Pain Pain - Right/Left: Right Pain - part of body: Knee     Time: 0247-0259 PT Time Calculation (min) (ACUTE ONLY): 12 min  Charges:  $Gait Training: 8-22 mins                     Vernell Morgans, SPT Acute Rehabilitation Services Office [phone]   Vernell Morgans 09/04/2018, 4:43 PM

## 2018-09-04 NOTE — Progress Notes (Signed)
Pt alert, able to make needs known. No acute distress noted. Dtr at bedside Pt made aware of discharge order. No equipment needed at home.

## 2018-09-04 NOTE — Progress Notes (Signed)
Telemetry reviewed. Normal sinus rhythm with first degree AV block throughout. No new recommendations. Will follow up as scheduled with Dr. Harl Bowie in [facility] office.  Sanda Klein, MD, [facility] [phone] office [phone] pager

## 2018-09-04 NOTE — Progress Notes (Signed)
Pt alert,a ble to make needs known. Denies pain at this time. Pt stable and ready to discharge. No acute distress noted. Pt escorted to private vehicle via staff.

## 2018-09-04 NOTE — Discharge Summary (Signed)
Patient ID: Susan Hicks MRN: [id number] DOB/AGE: 12/11/1945 72 y.o.  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 Depression   . Fibromyalgia    now considered mixed connective tissue disorder  . Generalized anxiety disorder 08/21/2018  . GERD (gastroesophageal reflux disease)   . Hypertension   . Irritable bowel   . Junctional bradycardia    a. 2017 - beta blocker discontinued.  . Mixed connective tissue disease ([place])   . Obstructive sleep apnea treated with continuous positive airway pressure (CPAP) 08/21/2018  . Osteoporosis   . Premature atrial contractions   . Primary localized osteoarthritis of right knee     Surgeries: Procedure(s): TOTAL KNEE ARTHROPLASTY on 09/02/2018   Consultants: Treatment Team:  Lbcardiology, Michae Kava, MD  Discharged Condition: Improved  Hospital Course: Susan Hicks is an 72 y.o. female who was admitted 09/02/2018 for operative treatment ofPrimary localized osteoarthritis of right knee. Patient has severe unremitting pain that affects sleep, daily activities, and work/hobbies. After pre-op clearance the patient was taken to the operating room on 09/02/2018 and underwent  Procedure(s): TOTAL KNEE ARTHROPLASTY.    Patient was given perioperative antibiotics:  Anti-infectives (From admission, onward)   Start     Dose/Rate Route Frequency  Ordered Stop   09/02/18 2030  ceFAZolin (ANCEF) IVPB 2g/100 mL premix     2 g 200 mL/hr over 30 Minutes Intravenous Every 6 hours 09/02/18 1318 09/03/18 0523   09/02/18 0718  cefUROXime (ZINACEF) injection  Status:  Discontinued       As needed 09/02/18 0718 09/02/18 0908   09/02/18 0600  ceFAZolin (ANCEF) IVPB 2g/100 mL premix     2 g 200 mL/hr over 30 Minutes Intravenous On call to O.R. 09/02/18 0545 09/02/18 0752       Patient was given sequential compression devices, early ambulation, and chemoprophylaxis to prevent DVT.  In the recovery room post op this patient had a significant cardiac arrhythmia.  Cardiology was consulted.  She was treated with fluid bolus and magnesium.  She progressed well with her hospital stay.  EKGs daily showed 1st degree block.  Today it says she has an anterior infarct age underdetermined.  Cardiology reviewed and documented no significant change since previous EKG.  Troponin were negative both at time of arrhythmia and on day of discharge.  She is stable from a cardiac standpoint per cardiology and has done great from an orthopedic standpoint?    Patient benefited maximally from hospital stay and there were no other complications.    Recent vital signs:  Patient Vitals for the past 24 hrs:  BP Temp Temp src Pulse Resp SpO2  09/04/18 0825 (!) 162/87 98.9 F (37.2 C) Oral 85 20 98 %  09/04/18 0415 (!) 184/81 98.5 F (36.9 C) Oral 68 20 97 %  09/03/18 2032 (!) 162/78 98.3 F (36.8 C) Oral 69 16 96 %  09/03/18 1434 (!) 159/85 98.5 F (36.9  C) Oral 73 - 100 %     Recent laboratory studies:  Recent Labs    09/03/18 0139 09/04/18 0307  WBC 7.5 8.2  HGB 9.4* 9.6*  HCT 28.8* 29.9*  PLT 216 259  NA 137 138  K 4.8 4.6  CL 107 105  CO2 22 25  BUN 18 15  CREATININE 1.15* 1.02*  GLUCOSE 130* 120*  CALCIUM 7.8* 8.3*     Discharge Medications:   Allergies as of 09/04/2018      Reactions   Lisinopril Cough   Beta Adrenergic Blockers Other (See  Comments)   Bradycardia - bottomed out heart rate    Lactose Intolerance (gi) Diarrhea   cramps   Norvasc [amlodipine] Other (See Comments)   HEADACHE       Medication List    STOP taking these medications   Fish Oil 500 MG Caps   Melatonin 5 MG Tabs   meloxicam 7.5 MG tablet Commonly known as:  MOBIC   METAMUCIL PO   multivitamin tablet   OVER THE COUNTER MEDICATION   SUPER B COMPLEX PO   traMADol 50 MG tablet Commonly known as:  ULTRAM   Turmeric Curcumin 500 MG Caps     TAKE these medications   aspirin 325 MG EC tablet 1 tab a day for the next 30 days to prevent blood clots What changed:    medication strength  how much to take  how to take this  when to take this  additional instructions  Another medication with the same name was removed. Continue taking this medication, and follow the directions you see here.   busPIRone 15 MG tablet Commonly known as:  BUSPAR Take 15 mg by mouth 2 (two) times daily.   CALCIUM/VITAMIN D3 GUMMIES PO Take 1 each by mouth 2 (two) times daily.   carboxymethylcellulose 0.5 % Soln Commonly known as:  REFRESH PLUS Place 1 drop into both eyes 3 (three) times daily.   cetirizine 10 MG tablet Commonly known as:  ZYRTEC Take 10 mg by mouth daily.   docusate sodium 100 MG capsule Commonly known as:  COLACE 1 tab 2 times a day while on narcotics.  STOOL SOFTENER   gabapentin 300 MG capsule Commonly known as:  NEURONTIN Take 1 capsule (300 mg total) by mouth at bedtime.   HYDROcodone-acetaminophen 5-325 MG tablet Commonly known as:  NORCO/VICODIN Take 1 tablet by mouth every 4 (four) hours as needed for moderate pain.   hydroxychloroquine 200 MG tablet Commonly known as:  PLAQUENIL Take 200 mg by mouth 2 (two) times daily.   LORazepam 0.5 MG tablet Commonly known as:  ATIVAN Take 0.5 mg by mouth at bedtime.   Magnesium 250 MG Tabs Take 250 mg by mouth at bedtime.   oxybutynin 5 MG tablet Commonly known  as:  DITROPAN Take 5 mg by mouth daily.   pantoprazole 40 MG tablet Commonly known as:  PROTONIX Take 40 mg by mouth daily.   polyethylene glycol packet Commonly known as:  MIRALAX / GLYCOLAX 17grams in 6 oz of something to drink twice a day until bowel movement.  LAXITIVE.  Restart if two days since last bowel movement   PRESCRIPTION MEDICATION Inhale into the lungs at bedtime. CPAP   sertraline 100 MG tablet Commonly known as:  ZOLOFT Take 150 mg by mouth daily.   valsartan-hydrochlorothiazide 160-25 MG tablet Commonly known as:  DIOVAN-HCT Take 1 tablet by mouth daily.   Vitamin D3 50 MCG (2000 UT) Tabs  Take 2,000 Units by mouth daily.            Discharge Care Instructions  (From admission, onward)         Start     Ordered   09/04/18 0000  Change dressing    Comments:  DO NOT REMOVE BANDAGE OVER SURGICAL INCISION.  [place] WHOLE LEG INCLUDING OVER THE WATERPROOF BANDAGE WITH SOAP AND WATER EVERY DAY.   09/04/18 1314          Diagnostic Studies: No results found.  Disposition: Discharge disposition: 01-Home or Self Care       Discharge Instructions    CPM   Complete by:  As directed    Continuous passive motion machine (CPM):      Use the CPM from 0 to 90 for 6 hours per day.       You may break it up into 2 or 3 sessions per day.      Use CPM for 2 weeks or until you are told to stop.   Call MD / Call 911   Complete by:  As directed    If you experience chest pain or shortness of breath, CALL 911 and be transported to the hospital emergency room.  If you develope a fever above 101 F, pus (white drainage) or increased drainage or redness at the wound, or calf pain, call your surgeon's office.   Change dressing   Complete by:  As directed    DO NOT REMOVE BANDAGE OVER SURGICAL INCISION.  [place] WHOLE LEG INCLUDING OVER THE WATERPROOF BANDAGE WITH SOAP AND WATER EVERY DAY.   Constipation Prevention   Complete by:  As directed    Drink plenty of fluids.   Prune juice may be helpful.  You may use a stool softener, such as Colace (over the counter) 100 mg twice a day.  Use MiraLax (over the counter) for constipation as needed.   Diet - low sodium heart healthy   Complete by:  As directed    Discharge instructions   Complete by:  As directed    INSTRUCTIONS AFTER JOINT REPLACEMENT   Remove items at home which could result in a fall. This includes throw rugs or furniture in walking pathways ICE to the affected joint every three hours while awake for 30 minutes at a time, for at least the first 3-5 days, and then as needed for pain and swelling.  Continue to use ice for pain and swelling. You may notice swelling that will progress down to the foot and ankle.  This is normal after surgery.  Elevate your leg when you are not up walking on it.   Continue to use the breathing machine you got in the hospital (incentive spirometer) which will help keep your temperature down.  It is common for your temperature to cycle up and down following surgery, especially at night when you are not up moving around and exerting yourself.  The breathing machine keeps your lungs expanded and your temperature down.   DIET:  As you were doing prior to hospitalization, we recommend a well-balanced diet.  DRESSING / WOUND CARE / SHOWERING  Keep the surgical dressing until follow up.  The dressing is water proof, so you can shower without any extra covering.  IF THE DRESSING FALLS OFF or the wound gets wet inside, change the dressing with sterile gauze.  Please use good hand washing techniques before changing the dressing.  Do not use any lotions or creams on the  incision until instructed by your surgeon.    ACTIVITY  Increase activity slowly as tolerated, but follow the weight bearing instructions below.   No driving for 6 weeks or until further direction given by your physician.  You cannot drive while taking narcotics.  No lifting or carrying greater than 10 lbs. until  further directed by your surgeon. Avoid periods of inactivity such as sitting longer than an hour when not asleep. This helps prevent blood clots.  You may return to work once you are authorized by your doctor.     WEIGHT BEARING   Weight bearing as tolerated with assist device (walker, cane, etc) as directed, use it as long as suggested by your surgeon or therapist, typically at least 2-3 weeks.   EXERCISES  Results after joint replacement surgery are often greatly improved when you follow the exercise, range of motion and muscle strengthening exercises prescribed by your doctor. Safety measures are also important to protect the joint from further injury. Any time any of these exercises cause you to have increased pain or swelling, decrease what you are doing until you are comfortable again and then slowly increase them. If you have problems or questions, call your caregiver or physical therapist for advice.   Rehabilitation is important following a joint replacement. After just a few days of immobilization, the muscles of the leg can become weakened and shrink (atrophy).  These exercises are designed to build up the tone and strength of the thigh and leg muscles and to improve motion. Often times heat used for twenty to thirty minutes before working out will loosen up your tissues and help with improving the range of motion but do not use heat for the first two weeks following surgery (sometimes heat can increase post-operative swelling).   These exercises can be done on a training (exercise) mat, on the floor, on a table or on a bed. Use whatever works the best and is most comfortable for you.    Use music or television while you are exercising so that the exercises are a pleasant break in your day. This will make your life better with the exercises acting as a break in your routine that you can look forward to.   Perform all exercises about fifteen times, three times per day or as directed.  You  should exercise both the operative leg and the other leg as well.   Exercises include:  Quad Sets - Tighten up the muscle on the front of the thigh (Quad) and hold for 5-10 seconds.   Straight Leg Raises - With your knee straight (if you were given a brace, keep it on), lift the leg to 60 degrees, hold for 3 seconds, and slowly lower the leg.  Perform this exercise against resistance later as your leg gets stronger.  Leg Slides: Lying on your back, slowly slide your foot toward your buttocks, bending your knee up off the floor (only go as far as is comfortable). Then slowly slide your foot back down until your leg is flat on the floor again.  Angel Wings: Lying on your back spread your legs to the side as far apart as you can without causing discomfort.  Hamstring Strength:  Lying on your back, push your heel against the floor with your leg straight by tightening up the muscles of your buttocks.  Repeat, but this time bend your knee to a comfortable angle, and push your heel against the floor.  You may put a pillow under  the heel to make it more comfortable if necessary.   A rehabilitation program following joint replacement surgery can speed recovery and prevent re-injury in the future due to weakened muscles. Contact your doctor or a physical therapist for more information on knee rehabilitation.    CONSTIPATION  Constipation is defined medically as fewer than three stools per week and severe constipation as less than one stool per week.  Even if you have a regular bowel pattern at home, your normal regimen is likely to be disrupted due to multiple reasons following surgery.  Combination of anesthesia, postoperative narcotics, change in appetite and fluid intake all can affect your bowels.   YOU MUST use at least one of the following options; they are listed in order of increasing strength to get the job done.  They are all available over the counter, and you may need to use some, POSSIBLY even  all of these options:    Drink plenty of fluids (prune juice may be helpful) and high fiber foods Colace 100 mg by mouth twice a day  Senokot for constipation as directed and as needed Dulcolax (bisacodyl), take with full glass of water  Miralax (polyethylene glycol) once or twice a day as needed.  If you have tried all these things and are unable to have a bowel movement in the first 3-4 days after surgery call either your surgeon or your primary doctor.    If you experience loose stools or diarrhea, hold the medications until you stool forms back up.  If your symptoms do not get better within 1 week or if they get worse, check with your doctor.  If you experience "the worst abdominal pain ever" or develop nausea or vomiting, please contact the office immediately for further recommendations for treatment.   ITCHING:  If you experience itching with your medications, try taking only a single pain pill, or even half a pain pill at a time.  You can also use Benadryl over the counter for itching or also to help with sleep.   TED HOSE STOCKINGS:  Use stockings on both legs until for at least 2 weeks or as directed by physician office. They may be removed at night for sleeping.  MEDICATIONS:  See your medication summary on the "After Visit Summary" that nursing will review with you.  You may have some home medications which will be placed on hold until you complete the course of blood thinner medication.  It is important for you to complete the blood thinner medication as prescribed.  PRECAUTIONS:  If you experience chest pain or shortness of breath - call 911 immediately for transfer to the hospital emergency department.   If you develop a fever greater that 101 F, purulent drainage from wound, increased redness or drainage from wound, foul odor from the wound/dressing, or calf pain - CONTACT YOUR SURGEON.                                                   FOLLOW-UP APPOINTMENTS:  If you do not  already have a post-op appointment, please call the office for an appointment to be seen by your surgeon.  Guidelines for how soon to be seen are listed in your "After Visit Summary", but are typically between 1-4 weeks after surgery.  OTHER INSTRUCTIONS:   Knee Replacement:  Do not place  pillow under knee, focus on keeping the knee straight while resting. CPM instructions: 0-90 degrees, 2 hours in the morning, 2 hours in the afternoon, and 2 hours in the evening. Place foam block, curve side up under heel at all times except when in CPM or when walking.  DO NOT modify, tear, cut, or change the foam block in any way.  MAKE SURE YOU:  Understand these instructions.  Get help right away if you are not doing well or get worse.    Thank you for letting us be a part of your medical care team.  It is a privilege we respect greatly.  We hope these instructions will help you stay on track for a fast and full recovery!   Do not put a pillow under the knee. Place it under the heel.   Complete by:  As directed    Place gray foam block, curve side up under heel at all times except when in CPM or when walking.  DO NOT modify, tear, cut, or change in any way the gray foam block.   Increase activity slowly as tolerated   Complete by:  As directed    Patient may shower   Complete by:  As directed    Aquacel dressing is water proof    Wash over it and the whole leg with soap and water at the end of your shower   TED hose   Complete by:  As directed    Use stockings (TED hose) for 2 weeks on both leg(s).  You may remove them at night for sleeping.      Follow-up Information    Elsie Saas, MD. Go on 09/16/2018.   Specialty:  Orthopedic Surgery Why:  Your appointment has been made for 3:30.  Contact information: [place] [address] [phone]        [facility]. Go on 09/17/2018.   Why:  Your Outpatient physical therapy will start on this  date at 11:00. Please arrive 15 minutes early to complete your paperwork  Contact information: [place], [place] [address]  [phone]       [facility] Follow up.   Specialty:  Home Health Services Why:  HHPT will see your for 5 visits prior to your outpaitent appointment  Contact information: [address] [place] [address] [phone]            Signed: Linda Hedges 09/04/2018, 1:15 PM

## 2018-09-04 NOTE — Progress Notes (Signed)
Subjective: 2 Days Post-Op Procedure(s) (LRB): TOTAL KNEE ARTHROPLASTY (Right) Patient reports pain as 4 on 0-10 scale.    Objective: Vital signs in last 24 hours: Temp:  [98.3 F (36.8 C)-98.5 F (36.9 C)] 98.5 F (36.9 C) (11/20 0415) Pulse Rate:  [68-73] 68 (11/20 0415) Resp:  [16-20] 20 (11/20 0415) BP: (159-184)/(78-85) 184/81 (11/20 0415) SpO2:  [96 %-100 %] 97 % (11/20 0415)  Intake/Output from previous day: 11/19 0701 - 11/20 0700 In: 2072 [P.O.:140; I.V.:1430.9; IV Piggyback:501.1] Out: -  Intake/Output this shift: No intake/output data recorded.  Recent Labs    09/02/18 1243 09/03/18 0139 09/04/18 0307  HGB 10.6* 9.4* 9.6*   Recent Labs    09/03/18 0139 09/04/18 0307  WBC 7.5 8.2  RBC 3.44* 3.53*  HCT 28.8* 29.9*  PLT 216 259   Recent Labs    09/03/18 0139 09/04/18 0307  NA 137 138  K 4.8 4.6  CL 107 105  CO2 22 25  BUN 18 15  CREATININE 1.15* 1.02*  GLUCOSE 130* 120*  CALCIUM 7.8* 8.3*   No results for input(s): LABPT, INR in the last 72 hours.  ABD soft Neurovascular intact Sensation intact distally Intact pulses distally Dorsiflexion/Plantar flexion intact Incision: scant drainage  Anticipated LOS equal to or greater than 2 midnights due to - Age 72 and older with one or more of the following:  - Obesity  - Expected need for hospital services (PT, OT, Nursing) required for safe  discharge  - Anticipated need for postoperative skilled nursing care or inpatient rehab  - Active co-morbidities: None OR   - Unanticipated findings during/Post Surgery: Lab abnormalities, Slow post-op progression: GI, pain control, mobility and cardiac arrhythmia post op   now with possible infarct changes on EKG  - Patient is a high risk of re-admission due to: None   Assessment/Plan: 2 Days Post-Op Procedure(s) (LRB): TOTAL KNEE ARTHROPLASTY (Right)  Principal Problem:   Primary localized osteoarthritis of right knee Active Problems:   Generalized  anxiety disorder   Irritable bowel   Hypertension   GERD (gastroesophageal reflux disease)   Fibromyalgia   Obstructive sleep apnea treated with continuous positive airway pressure (CPAP)   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  With this reading, I will reach out to cardiology again since they signed off yesterday to do one more evaluation on this patient and review this EKG.  Discharge of this patient pending this review.  OK to be up with physical therapy today. Advance diet Up with therapy    Susan Hicks 09/04/2018, 7:31 AM

## 2018-09-04 NOTE — Progress Notes (Signed)
Physical Therapy Treatment Patient Details Name: Susan Hicks MRN: [id number] DOB: 08/11/1946 Today's Date: 09/04/2018    History of Present Illness Pt is 72 y.o. s/p R TKA (09/02/18) secondary to osteoarthritis. PMH significant for osteoporosis, obstructive sleep apnea with use of CPAP, GERD, irritable bowel, HTN, anxiety, fibromyalgia, and bradycardia.    PT Comments    Pt reports some increased pain today, sitting with ice at start of treatment. Pt able to perform all mobility with min guard, and reports she has been up and completed some exercises this morning. Pt's session limited by pain, ambulating shorter distance with return to seated position with ice. Educated on importance of pain management for continued mobility. Pt and family with no further questions at this time. DC plan remains appropriate.    Follow Up Recommendations  Follow surgeon's recommendation for DC plan and follow-up therapies;Supervision for mobility/OOB     Equipment Recommendations  Rolling walker with 5" wheels(youth walker)       Precautions / Restrictions Precautions Precautions: Fall Restrictions Weight Bearing Restrictions: Yes RLE Weight Bearing: Weight bearing as tolerated    Mobility  Transfers Overall transfer level: Needs assistance Equipment used: Rolling walker (2 wheeled) Transfers: Sit to/from Stand Sit to Stand: Min guard         General transfer comment: min guard for safety, pt performing transfer with greater ease and improved weight bear through RLE today.  Ambulation/Gait Ambulation/Gait assistance: Min guard Gait Distance (Feet): 220 Feet Assistive device: Rolling walker (2 wheeled) Gait Pattern/deviations: Step-through pattern;Narrow base of support   Gait velocity interpretation: 1.31 - 2.62 ft/sec, indicative of limited community ambulator General Gait Details: hands on min guard for safety progressing to min guard; improved posture and management of RW, min  cuing to relax shoulders and increase base of support slightly.          Balance Overall balance assessment: Needs assistance Sitting-balance support: Feet supported Sitting balance-Leahy Scale: Good     Standing balance support: No upper extremity supported;During functional activity Standing balance-Leahy Scale: Good                              Cognition Arousal/Alertness: Awake/alert Behavior During Therapy: WFL for tasks assessed/performed Overall Cognitive Status: Within Functional Limits for tasks assessed                                           General Comments General comments (skin integrity, edema, etc.): Pt has increased pain today, with ice on knee at start of treatment. Some edema present around right knee through mid-calf. Pt educated on importance of moving in a tolerable range for pain management. Pt's granddaughter present for duration of treatment.      Pertinent Vitals/Pain Pain Assessment: Faces Faces Pain Scale: Hurts even more Pain Location: right knee Pain Descriptors / Indicators: Burning;Grimacing;Guarding;Sharp;Tightness Pain Intervention(s): Limited activity within patient's tolerance;Monitored during session           PT Goals (current goals can now be found in the care plan section) Acute Rehab PT Goals Patient Stated Goal: return home PT Goal Formulation: With patient/family Time For Goal Achievement: 09/17/18 Potential to Achieve Goals: Good Progress towards PT goals: Progressing toward goals    Frequency    7X/week      PT Plan Current plan remains appropriate  AM-PAC PT "6 Clicks" Daily Activity  Outcome Measure  Difficulty turning over in bed (including adjusting bedclothes, sheets and blankets)?: A Little Difficulty moving from lying on back to sitting on the side of the bed? : A Lot Difficulty sitting down on and standing up from a chair with arms (e.g., wheelchair, bedside commode,  etc,.)?: Unable Help needed moving to and from a bed to chair (including a wheelchair)?: A Little Help needed walking in hospital room?: A Little Help needed climbing 3-5 steps with a railing? : A Lot 6 Click Score: 14    End of Session Equipment Utilized During Treatment: Gait belt Activity Tolerance: Patient tolerated treatment well Patient left: in chair;with call bell/phone within reach;with family/visitor present   PT Visit Diagnosis: Other abnormalities of gait and mobility (R26.89);Muscle weakness (generalized) (M62.81);Difficulty in walking, not elsewhere classified (R26.2);Pain Pain - Right/Left: Right Pain - part of body: Knee     Time: 3291-9166 PT Time Calculation (min) (ACUTE ONLY): 10 min  Charges:  $Gait Training: 8-22 mins                     Vernell Morgans, SPT Acute Rehabilitation Services Office [phone]    Vernell Morgans 09/04/2018, 2:30 PM

## 2018-09-05 DIAGNOSIS — K219 Gastro-esophageal reflux disease without esophagitis: Secondary | ICD-10-CM | POA: Diagnosis not present

## 2018-09-05 DIAGNOSIS — Z471 Aftercare following joint replacement surgery: Secondary | ICD-10-CM | POA: Diagnosis not present

## 2018-09-05 DIAGNOSIS — Z9181 History of falling: Secondary | ICD-10-CM | POA: Diagnosis not present

## 2018-09-05 DIAGNOSIS — F329 Major depressive disorder, single episode, unspecified: Secondary | ICD-10-CM | POA: Diagnosis not present

## 2018-09-05 DIAGNOSIS — I441 Atrioventricular block, second degree: Secondary | ICD-10-CM | POA: Diagnosis not present

## 2018-09-05 DIAGNOSIS — Z7982 Long term (current) use of aspirin: Secondary | ICD-10-CM | POA: Diagnosis not present

## 2018-09-05 DIAGNOSIS — M810 Age-related osteoporosis without current pathological fracture: Secondary | ICD-10-CM | POA: Diagnosis not present

## 2018-09-05 DIAGNOSIS — I491 Atrial premature depolarization: Secondary | ICD-10-CM | POA: Diagnosis not present

## 2018-09-05 DIAGNOSIS — Z96651 Presence of right artificial knee joint: Secondary | ICD-10-CM | POA: Diagnosis not present

## 2018-09-05 DIAGNOSIS — I10 Essential (primary) hypertension: Secondary | ICD-10-CM | POA: Diagnosis not present

## 2018-09-05 DIAGNOSIS — K589 Irritable bowel syndrome without diarrhea: Secondary | ICD-10-CM | POA: Diagnosis not present

## 2018-09-05 DIAGNOSIS — I442 Atrioventricular block, complete: Secondary | ICD-10-CM | POA: Diagnosis not present

## 2018-09-05 DIAGNOSIS — I447 Left bundle-branch block, unspecified: Secondary | ICD-10-CM | POA: Diagnosis not present

## 2018-09-05 DIAGNOSIS — F411 Generalized anxiety disorder: Secondary | ICD-10-CM | POA: Diagnosis not present

## 2018-09-05 DIAGNOSIS — M797 Fibromyalgia: Secondary | ICD-10-CM | POA: Diagnosis not present

## 2018-09-05 DIAGNOSIS — G4733 Obstructive sleep apnea (adult) (pediatric): Secondary | ICD-10-CM | POA: Diagnosis not present

## 2018-09-07 DIAGNOSIS — I10 Essential (primary) hypertension: Secondary | ICD-10-CM | POA: Diagnosis not present

## 2018-09-07 DIAGNOSIS — M797 Fibromyalgia: Secondary | ICD-10-CM | POA: Diagnosis not present

## 2018-09-07 DIAGNOSIS — I491 Atrial premature depolarization: Secondary | ICD-10-CM | POA: Diagnosis not present

## 2018-09-07 DIAGNOSIS — M810 Age-related osteoporosis without current pathological fracture: Secondary | ICD-10-CM | POA: Diagnosis not present

## 2018-09-07 DIAGNOSIS — Z471 Aftercare following joint replacement surgery: Secondary | ICD-10-CM | POA: Diagnosis not present

## 2018-09-07 DIAGNOSIS — I447 Left bundle-branch block, unspecified: Secondary | ICD-10-CM | POA: Diagnosis not present

## 2018-09-09 DIAGNOSIS — I10 Essential (primary) hypertension: Secondary | ICD-10-CM | POA: Diagnosis not present

## 2018-09-09 DIAGNOSIS — M810 Age-related osteoporosis without current pathological fracture: Secondary | ICD-10-CM | POA: Diagnosis not present

## 2018-09-09 DIAGNOSIS — I447 Left bundle-branch block, unspecified: Secondary | ICD-10-CM | POA: Diagnosis not present

## 2018-09-09 DIAGNOSIS — M797 Fibromyalgia: Secondary | ICD-10-CM | POA: Diagnosis not present

## 2018-09-09 DIAGNOSIS — Z471 Aftercare following joint replacement surgery: Secondary | ICD-10-CM | POA: Diagnosis not present

## 2018-09-09 DIAGNOSIS — I491 Atrial premature depolarization: Secondary | ICD-10-CM | POA: Diagnosis not present

## 2018-09-10 DIAGNOSIS — I10 Essential (primary) hypertension: Secondary | ICD-10-CM | POA: Diagnosis not present

## 2018-09-10 DIAGNOSIS — F329 Major depressive disorder, single episode, unspecified: Secondary | ICD-10-CM | POA: Diagnosis not present

## 2018-09-10 DIAGNOSIS — I491 Atrial premature depolarization: Secondary | ICD-10-CM | POA: Diagnosis not present

## 2018-09-10 DIAGNOSIS — Z471 Aftercare following joint replacement surgery: Secondary | ICD-10-CM | POA: Diagnosis not present

## 2018-09-10 DIAGNOSIS — M797 Fibromyalgia: Secondary | ICD-10-CM | POA: Diagnosis not present

## 2018-09-10 DIAGNOSIS — M810 Age-related osteoporosis without current pathological fracture: Secondary | ICD-10-CM | POA: Diagnosis not present

## 2018-09-10 DIAGNOSIS — E7800 Pure hypercholesterolemia, unspecified: Secondary | ICD-10-CM | POA: Diagnosis not present

## 2018-09-10 DIAGNOSIS — I447 Left bundle-branch block, unspecified: Secondary | ICD-10-CM | POA: Diagnosis not present

## 2018-09-13 DIAGNOSIS — I447 Left bundle-branch block, unspecified: Secondary | ICD-10-CM | POA: Diagnosis not present

## 2018-09-13 DIAGNOSIS — M810 Age-related osteoporosis without current pathological fracture: Secondary | ICD-10-CM | POA: Diagnosis not present

## 2018-09-13 DIAGNOSIS — Z471 Aftercare following joint replacement surgery: Secondary | ICD-10-CM | POA: Diagnosis not present

## 2018-09-13 DIAGNOSIS — M797 Fibromyalgia: Secondary | ICD-10-CM | POA: Diagnosis not present

## 2018-09-13 DIAGNOSIS — I491 Atrial premature depolarization: Secondary | ICD-10-CM | POA: Diagnosis not present

## 2018-09-13 DIAGNOSIS — I10 Essential (primary) hypertension: Secondary | ICD-10-CM | POA: Diagnosis not present

## 2018-09-17 DIAGNOSIS — M6281 Muscle weakness (generalized): Secondary | ICD-10-CM | POA: Diagnosis not present

## 2018-09-17 DIAGNOSIS — Z96651 Presence of right artificial knee joint: Secondary | ICD-10-CM | POA: Diagnosis not present

## 2018-09-17 DIAGNOSIS — M25561 Pain in right knee: Secondary | ICD-10-CM | POA: Diagnosis not present

## 2018-09-17 DIAGNOSIS — R269 Unspecified abnormalities of gait and mobility: Secondary | ICD-10-CM | POA: Diagnosis not present

## 2018-09-17 DIAGNOSIS — Z471 Aftercare following joint replacement surgery: Secondary | ICD-10-CM | POA: Diagnosis not present

## 2018-09-17 DIAGNOSIS — M1711 Unilateral primary osteoarthritis, right knee: Secondary | ICD-10-CM | POA: Diagnosis not present

## 2018-09-18 DIAGNOSIS — I10 Essential (primary) hypertension: Secondary | ICD-10-CM | POA: Diagnosis not present

## 2018-09-18 DIAGNOSIS — G4730 Sleep apnea, unspecified: Secondary | ICD-10-CM | POA: Diagnosis not present

## 2018-09-18 DIAGNOSIS — Z299 Encounter for prophylactic measures, unspecified: Secondary | ICD-10-CM | POA: Diagnosis not present

## 2018-09-18 DIAGNOSIS — Z6830 Body mass index (BMI) 30.0-30.9, adult: Secondary | ICD-10-CM | POA: Diagnosis not present

## 2018-09-20 DIAGNOSIS — Z96651 Presence of right artificial knee joint: Secondary | ICD-10-CM | POA: Diagnosis not present

## 2018-09-20 DIAGNOSIS — R269 Unspecified abnormalities of gait and mobility: Secondary | ICD-10-CM | POA: Diagnosis not present

## 2018-09-20 DIAGNOSIS — M6281 Muscle weakness (generalized): Secondary | ICD-10-CM | POA: Diagnosis not present

## 2018-09-20 DIAGNOSIS — Z471 Aftercare following joint replacement surgery: Secondary | ICD-10-CM | POA: Diagnosis not present

## 2018-09-20 DIAGNOSIS — M25561 Pain in right knee: Secondary | ICD-10-CM | POA: Diagnosis not present

## 2018-09-24 DIAGNOSIS — M6281 Muscle weakness (generalized): Secondary | ICD-10-CM | POA: Diagnosis not present

## 2018-09-24 DIAGNOSIS — Z471 Aftercare following joint replacement surgery: Secondary | ICD-10-CM | POA: Diagnosis not present

## 2018-09-24 DIAGNOSIS — R269 Unspecified abnormalities of gait and mobility: Secondary | ICD-10-CM | POA: Diagnosis not present

## 2018-09-24 DIAGNOSIS — Z96651 Presence of right artificial knee joint: Secondary | ICD-10-CM | POA: Diagnosis not present

## 2018-09-24 DIAGNOSIS — M25561 Pain in right knee: Secondary | ICD-10-CM | POA: Diagnosis not present

## 2018-09-26 DIAGNOSIS — R269 Unspecified abnormalities of gait and mobility: Secondary | ICD-10-CM | POA: Diagnosis not present

## 2018-09-26 DIAGNOSIS — M25561 Pain in right knee: Secondary | ICD-10-CM | POA: Diagnosis not present

## 2018-09-26 DIAGNOSIS — M6281 Muscle weakness (generalized): Secondary | ICD-10-CM | POA: Diagnosis not present

## 2018-09-26 DIAGNOSIS — Z471 Aftercare following joint replacement surgery: Secondary | ICD-10-CM | POA: Diagnosis not present

## 2018-09-26 DIAGNOSIS — Z96651 Presence of right artificial knee joint: Secondary | ICD-10-CM | POA: Diagnosis not present

## 2018-10-01 DIAGNOSIS — M25561 Pain in right knee: Secondary | ICD-10-CM | POA: Diagnosis not present

## 2018-10-01 DIAGNOSIS — Z471 Aftercare following joint replacement surgery: Secondary | ICD-10-CM | POA: Diagnosis not present

## 2018-10-01 DIAGNOSIS — M6281 Muscle weakness (generalized): Secondary | ICD-10-CM | POA: Diagnosis not present

## 2018-10-01 DIAGNOSIS — Z96651 Presence of right artificial knee joint: Secondary | ICD-10-CM | POA: Diagnosis not present

## 2018-10-01 DIAGNOSIS — R269 Unspecified abnormalities of gait and mobility: Secondary | ICD-10-CM | POA: Diagnosis not present

## 2018-10-03 DIAGNOSIS — Z96651 Presence of right artificial knee joint: Secondary | ICD-10-CM | POA: Diagnosis not present

## 2018-10-03 DIAGNOSIS — Z471 Aftercare following joint replacement surgery: Secondary | ICD-10-CM | POA: Diagnosis not present

## 2018-10-03 DIAGNOSIS — M25561 Pain in right knee: Secondary | ICD-10-CM | POA: Diagnosis not present

## 2018-10-03 DIAGNOSIS — M6281 Muscle weakness (generalized): Secondary | ICD-10-CM | POA: Diagnosis not present

## 2018-10-03 DIAGNOSIS — R269 Unspecified abnormalities of gait and mobility: Secondary | ICD-10-CM | POA: Diagnosis not present

## 2018-10-07 DIAGNOSIS — M25561 Pain in right knee: Secondary | ICD-10-CM | POA: Diagnosis not present

## 2018-10-07 DIAGNOSIS — M6281 Muscle weakness (generalized): Secondary | ICD-10-CM | POA: Diagnosis not present

## 2018-10-07 DIAGNOSIS — Z471 Aftercare following joint replacement surgery: Secondary | ICD-10-CM | POA: Diagnosis not present

## 2018-10-07 DIAGNOSIS — R269 Unspecified abnormalities of gait and mobility: Secondary | ICD-10-CM | POA: Diagnosis not present

## 2018-10-07 DIAGNOSIS — Z96651 Presence of right artificial knee joint: Secondary | ICD-10-CM | POA: Diagnosis not present

## 2018-10-08 DIAGNOSIS — I10 Essential (primary) hypertension: Secondary | ICD-10-CM | POA: Diagnosis not present

## 2018-10-08 DIAGNOSIS — F329 Major depressive disorder, single episode, unspecified: Secondary | ICD-10-CM | POA: Diagnosis not present

## 2018-10-08 DIAGNOSIS — E7800 Pure hypercholesterolemia, unspecified: Secondary | ICD-10-CM | POA: Diagnosis not present

## 2018-10-11 DIAGNOSIS — Z96651 Presence of right artificial knee joint: Secondary | ICD-10-CM | POA: Diagnosis not present

## 2018-10-11 DIAGNOSIS — Z471 Aftercare following joint replacement surgery: Secondary | ICD-10-CM | POA: Diagnosis not present

## 2018-10-11 DIAGNOSIS — R269 Unspecified abnormalities of gait and mobility: Secondary | ICD-10-CM | POA: Diagnosis not present

## 2018-10-11 DIAGNOSIS — M6281 Muscle weakness (generalized): Secondary | ICD-10-CM | POA: Diagnosis not present

## 2018-10-11 DIAGNOSIS — M25561 Pain in right knee: Secondary | ICD-10-CM | POA: Diagnosis not present

## 2018-10-15 DIAGNOSIS — Z471 Aftercare following joint replacement surgery: Secondary | ICD-10-CM | POA: Diagnosis not present

## 2018-10-15 DIAGNOSIS — M25561 Pain in right knee: Secondary | ICD-10-CM | POA: Diagnosis not present

## 2018-10-15 DIAGNOSIS — M6281 Muscle weakness (generalized): Secondary | ICD-10-CM | POA: Diagnosis not present

## 2018-10-15 DIAGNOSIS — Z96651 Presence of right artificial knee joint: Secondary | ICD-10-CM | POA: Diagnosis not present

## 2018-10-15 DIAGNOSIS — R269 Unspecified abnormalities of gait and mobility: Secondary | ICD-10-CM | POA: Diagnosis not present

## 2018-10-17 DIAGNOSIS — M6281 Muscle weakness (generalized): Secondary | ICD-10-CM | POA: Diagnosis not present

## 2018-10-17 DIAGNOSIS — Z96651 Presence of right artificial knee joint: Secondary | ICD-10-CM | POA: Diagnosis not present

## 2018-10-17 DIAGNOSIS — Z471 Aftercare following joint replacement surgery: Secondary | ICD-10-CM | POA: Diagnosis not present

## 2018-10-17 DIAGNOSIS — R269 Unspecified abnormalities of gait and mobility: Secondary | ICD-10-CM | POA: Diagnosis not present

## 2018-10-17 DIAGNOSIS — M25561 Pain in right knee: Secondary | ICD-10-CM | POA: Diagnosis not present

## 2018-10-22 DIAGNOSIS — M1711 Unilateral primary osteoarthritis, right knee: Secondary | ICD-10-CM | POA: Diagnosis not present

## 2018-10-25 DIAGNOSIS — Z471 Aftercare following joint replacement surgery: Secondary | ICD-10-CM | POA: Diagnosis not present

## 2018-10-25 DIAGNOSIS — M25561 Pain in right knee: Secondary | ICD-10-CM | POA: Diagnosis not present

## 2018-10-25 DIAGNOSIS — R269 Unspecified abnormalities of gait and mobility: Secondary | ICD-10-CM | POA: Diagnosis not present

## 2018-10-25 DIAGNOSIS — Z96651 Presence of right artificial knee joint: Secondary | ICD-10-CM | POA: Diagnosis not present

## 2018-10-25 DIAGNOSIS — M6281 Muscle weakness (generalized): Secondary | ICD-10-CM | POA: Diagnosis not present

## 2018-10-28 DIAGNOSIS — M1711 Unilateral primary osteoarthritis, right knee: Secondary | ICD-10-CM | POA: Diagnosis not present

## 2018-10-29 DIAGNOSIS — M25561 Pain in right knee: Secondary | ICD-10-CM | POA: Diagnosis not present

## 2018-10-29 DIAGNOSIS — R269 Unspecified abnormalities of gait and mobility: Secondary | ICD-10-CM | POA: Diagnosis not present

## 2018-10-29 DIAGNOSIS — Z96651 Presence of right artificial knee joint: Secondary | ICD-10-CM | POA: Diagnosis not present

## 2018-10-29 DIAGNOSIS — M6281 Muscle weakness (generalized): Secondary | ICD-10-CM | POA: Diagnosis not present

## 2018-10-29 DIAGNOSIS — Z471 Aftercare following joint replacement surgery: Secondary | ICD-10-CM | POA: Diagnosis not present

## 2018-10-31 DIAGNOSIS — G4730 Sleep apnea, unspecified: Secondary | ICD-10-CM | POA: Diagnosis not present

## 2018-10-31 DIAGNOSIS — I10 Essential (primary) hypertension: Secondary | ICD-10-CM | POA: Diagnosis not present

## 2018-10-31 DIAGNOSIS — Z299 Encounter for prophylactic measures, unspecified: Secondary | ICD-10-CM | POA: Diagnosis not present

## 2018-10-31 DIAGNOSIS — Z6828 Body mass index (BMI) 28.0-28.9, adult: Secondary | ICD-10-CM | POA: Diagnosis not present

## 2018-10-31 DIAGNOSIS — Z789 Other specified health status: Secondary | ICD-10-CM | POA: Diagnosis not present

## 2018-10-31 DIAGNOSIS — G4700 Insomnia, unspecified: Secondary | ICD-10-CM | POA: Diagnosis not present

## 2018-11-05 DIAGNOSIS — Z471 Aftercare following joint replacement surgery: Secondary | ICD-10-CM | POA: Diagnosis not present

## 2018-11-05 DIAGNOSIS — M25561 Pain in right knee: Secondary | ICD-10-CM | POA: Diagnosis not present

## 2018-11-05 DIAGNOSIS — R269 Unspecified abnormalities of gait and mobility: Secondary | ICD-10-CM | POA: Diagnosis not present

## 2018-11-05 DIAGNOSIS — M6281 Muscle weakness (generalized): Secondary | ICD-10-CM | POA: Diagnosis not present

## 2018-11-05 DIAGNOSIS — Z96651 Presence of right artificial knee joint: Secondary | ICD-10-CM | POA: Diagnosis not present

## 2018-11-07 DIAGNOSIS — Z1231 Encounter for screening mammogram for malignant neoplasm of breast: Secondary | ICD-10-CM | POA: Diagnosis not present

## 2018-11-13 DIAGNOSIS — R768 Other specified abnormal immunological findings in serum: Secondary | ICD-10-CM | POA: Diagnosis not present

## 2018-11-13 DIAGNOSIS — M797 Fibromyalgia: Secondary | ICD-10-CM | POA: Diagnosis not present

## 2018-11-13 DIAGNOSIS — I7300 Raynaud's syndrome without gangrene: Secondary | ICD-10-CM | POA: Diagnosis not present

## 2018-11-13 DIAGNOSIS — Z6826 Body mass index (BMI) 26.0-26.9, adult: Secondary | ICD-10-CM | POA: Diagnosis not present

## 2018-11-13 DIAGNOSIS — M3500 Sicca syndrome, unspecified: Secondary | ICD-10-CM | POA: Diagnosis not present

## 2018-11-13 DIAGNOSIS — M150 Primary generalized (osteo)arthritis: Secondary | ICD-10-CM | POA: Diagnosis not present

## 2018-11-13 DIAGNOSIS — E663 Overweight: Secondary | ICD-10-CM | POA: Diagnosis not present

## 2018-11-13 DIAGNOSIS — M2550 Pain in unspecified joint: Secondary | ICD-10-CM | POA: Diagnosis not present

## 2018-11-13 DIAGNOSIS — M351 Other overlap syndromes: Secondary | ICD-10-CM | POA: Diagnosis not present

## 2018-11-18 DIAGNOSIS — F329 Major depressive disorder, single episode, unspecified: Secondary | ICD-10-CM | POA: Diagnosis not present

## 2018-11-18 DIAGNOSIS — I10 Essential (primary) hypertension: Secondary | ICD-10-CM | POA: Diagnosis not present

## 2018-11-18 DIAGNOSIS — E7800 Pure hypercholesterolemia, unspecified: Secondary | ICD-10-CM | POA: Diagnosis not present

## 2018-12-10 DIAGNOSIS — M1711 Unilateral primary osteoarthritis, right knee: Secondary | ICD-10-CM | POA: Diagnosis not present

## 2018-12-13 DIAGNOSIS — E559 Vitamin D deficiency, unspecified: Secondary | ICD-10-CM | POA: Diagnosis not present

## 2018-12-13 DIAGNOSIS — E7800 Pure hypercholesterolemia, unspecified: Secondary | ICD-10-CM | POA: Diagnosis not present

## 2018-12-13 DIAGNOSIS — Z01419 Encounter for gynecological examination (general) (routine) without abnormal findings: Secondary | ICD-10-CM | POA: Diagnosis not present

## 2018-12-13 DIAGNOSIS — Z1339 Encounter for screening examination for other mental health and behavioral disorders: Secondary | ICD-10-CM | POA: Diagnosis not present

## 2018-12-13 DIAGNOSIS — Z79899 Other long term (current) drug therapy: Secondary | ICD-10-CM | POA: Diagnosis not present

## 2018-12-13 DIAGNOSIS — Z1211 Encounter for screening for malignant neoplasm of colon: Secondary | ICD-10-CM | POA: Diagnosis not present

## 2018-12-13 DIAGNOSIS — Z1331 Encounter for screening for depression: Secondary | ICD-10-CM | POA: Diagnosis not present

## 2018-12-13 DIAGNOSIS — Z7189 Other specified counseling: Secondary | ICD-10-CM | POA: Diagnosis not present

## 2018-12-13 DIAGNOSIS — R5383 Other fatigue: Secondary | ICD-10-CM | POA: Diagnosis not present

## 2018-12-13 DIAGNOSIS — I10 Essential (primary) hypertension: Secondary | ICD-10-CM | POA: Diagnosis not present

## 2018-12-13 DIAGNOSIS — Z0000 Encounter for general adult medical examination without abnormal findings: Secondary | ICD-10-CM | POA: Diagnosis not present

## 2018-12-13 DIAGNOSIS — Z6827 Body mass index (BMI) 27.0-27.9, adult: Secondary | ICD-10-CM | POA: Diagnosis not present

## 2018-12-13 DIAGNOSIS — Z299 Encounter for prophylactic measures, unspecified: Secondary | ICD-10-CM | POA: Diagnosis not present

## 2018-12-17 DIAGNOSIS — E7800 Pure hypercholesterolemia, unspecified: Secondary | ICD-10-CM | POA: Diagnosis not present

## 2018-12-17 DIAGNOSIS — F329 Major depressive disorder, single episode, unspecified: Secondary | ICD-10-CM | POA: Diagnosis not present

## 2018-12-17 DIAGNOSIS — I10 Essential (primary) hypertension: Secondary | ICD-10-CM | POA: Diagnosis not present

## 2019-01-29 DIAGNOSIS — E7800 Pure hypercholesterolemia, unspecified: Secondary | ICD-10-CM | POA: Diagnosis not present

## 2019-01-29 DIAGNOSIS — I10 Essential (primary) hypertension: Secondary | ICD-10-CM | POA: Diagnosis not present

## 2019-01-29 DIAGNOSIS — F329 Major depressive disorder, single episode, unspecified: Secondary | ICD-10-CM | POA: Diagnosis not present

## 2019-02-06 DIAGNOSIS — E7800 Pure hypercholesterolemia, unspecified: Secondary | ICD-10-CM | POA: Diagnosis not present

## 2019-02-06 DIAGNOSIS — I700 Atherosclerosis of aorta: Secondary | ICD-10-CM | POA: Diagnosis not present

## 2019-02-06 DIAGNOSIS — Z7982 Long term (current) use of aspirin: Secondary | ICD-10-CM | POA: Diagnosis not present

## 2019-02-06 DIAGNOSIS — Z79899 Other long term (current) drug therapy: Secondary | ICD-10-CM | POA: Diagnosis not present

## 2019-02-06 DIAGNOSIS — Z9049 Acquired absence of other specified parts of digestive tract: Secondary | ICD-10-CM | POA: Diagnosis not present

## 2019-02-06 DIAGNOSIS — M8580 Other specified disorders of bone density and structure, unspecified site: Secondary | ICD-10-CM | POA: Diagnosis not present

## 2019-02-06 DIAGNOSIS — K589 Irritable bowel syndrome without diarrhea: Secondary | ICD-10-CM | POA: Diagnosis not present

## 2019-02-06 DIAGNOSIS — K219 Gastro-esophageal reflux disease without esophagitis: Secondary | ICD-10-CM | POA: Diagnosis not present

## 2019-02-06 DIAGNOSIS — M797 Fibromyalgia: Secondary | ICD-10-CM | POA: Diagnosis not present

## 2019-02-06 DIAGNOSIS — R945 Abnormal results of liver function studies: Secondary | ICD-10-CM | POA: Diagnosis not present

## 2019-02-06 DIAGNOSIS — Z86711 Personal history of pulmonary embolism: Secondary | ICD-10-CM | POA: Diagnosis not present

## 2019-02-06 DIAGNOSIS — Z950 Presence of cardiac pacemaker: Secondary | ICD-10-CM | POA: Diagnosis not present

## 2019-02-06 DIAGNOSIS — R079 Chest pain, unspecified: Secondary | ICD-10-CM | POA: Diagnosis not present

## 2019-02-06 DIAGNOSIS — R0789 Other chest pain: Secondary | ICD-10-CM | POA: Diagnosis not present

## 2019-02-06 DIAGNOSIS — G4700 Insomnia, unspecified: Secondary | ICD-10-CM | POA: Diagnosis not present

## 2019-02-06 DIAGNOSIS — R911 Solitary pulmonary nodule: Secondary | ICD-10-CM | POA: Diagnosis not present

## 2019-02-06 DIAGNOSIS — Z888 Allergy status to other drugs, medicaments and biological substances status: Secondary | ICD-10-CM | POA: Diagnosis not present

## 2019-02-06 DIAGNOSIS — R32 Unspecified urinary incontinence: Secondary | ICD-10-CM | POA: Diagnosis not present

## 2019-02-06 DIAGNOSIS — F329 Major depressive disorder, single episode, unspecified: Secondary | ICD-10-CM | POA: Diagnosis not present

## 2019-02-06 DIAGNOSIS — K760 Fatty (change of) liver, not elsewhere classified: Secondary | ICD-10-CM | POA: Diagnosis not present

## 2019-02-06 DIAGNOSIS — M545 Low back pain: Secondary | ICD-10-CM | POA: Diagnosis not present

## 2019-02-06 DIAGNOSIS — R0689 Other abnormalities of breathing: Secondary | ICD-10-CM | POA: Diagnosis not present

## 2019-02-06 DIAGNOSIS — I10 Essential (primary) hypertension: Secondary | ICD-10-CM | POA: Diagnosis not present

## 2019-02-06 DIAGNOSIS — G4730 Sleep apnea, unspecified: Secondary | ICD-10-CM | POA: Diagnosis not present

## 2019-02-07 DIAGNOSIS — R911 Solitary pulmonary nodule: Secondary | ICD-10-CM | POA: Diagnosis not present

## 2019-02-07 DIAGNOSIS — K760 Fatty (change of) liver, not elsewhere classified: Secondary | ICD-10-CM | POA: Diagnosis not present

## 2019-02-07 DIAGNOSIS — R079 Chest pain, unspecified: Secondary | ICD-10-CM | POA: Diagnosis not present

## 2019-02-13 DIAGNOSIS — G4730 Sleep apnea, unspecified: Secondary | ICD-10-CM | POA: Diagnosis not present

## 2019-02-13 DIAGNOSIS — Z6827 Body mass index (BMI) 27.0-27.9, adult: Secondary | ICD-10-CM | POA: Diagnosis not present

## 2019-02-13 DIAGNOSIS — I10 Essential (primary) hypertension: Secondary | ICD-10-CM | POA: Diagnosis not present

## 2019-02-13 DIAGNOSIS — R079 Chest pain, unspecified: Secondary | ICD-10-CM | POA: Diagnosis not present

## 2019-02-13 DIAGNOSIS — Z299 Encounter for prophylactic measures, unspecified: Secondary | ICD-10-CM | POA: Diagnosis not present

## 2019-03-06 DIAGNOSIS — Z299 Encounter for prophylactic measures, unspecified: Secondary | ICD-10-CM | POA: Diagnosis not present

## 2019-03-06 DIAGNOSIS — I10 Essential (primary) hypertension: Secondary | ICD-10-CM | POA: Diagnosis not present

## 2019-03-06 DIAGNOSIS — G4700 Insomnia, unspecified: Secondary | ICD-10-CM | POA: Diagnosis not present

## 2019-03-07 DIAGNOSIS — F329 Major depressive disorder, single episode, unspecified: Secondary | ICD-10-CM | POA: Diagnosis not present

## 2019-03-07 DIAGNOSIS — I10 Essential (primary) hypertension: Secondary | ICD-10-CM | POA: Diagnosis not present

## 2019-03-07 DIAGNOSIS — E7800 Pure hypercholesterolemia, unspecified: Secondary | ICD-10-CM | POA: Diagnosis not present

## 2019-03-25 DIAGNOSIS — R21 Rash and other nonspecific skin eruption: Secondary | ICD-10-CM | POA: Diagnosis not present

## 2019-03-25 DIAGNOSIS — Z6827 Body mass index (BMI) 27.0-27.9, adult: Secondary | ICD-10-CM | POA: Diagnosis not present

## 2019-03-25 DIAGNOSIS — Z713 Dietary counseling and surveillance: Secondary | ICD-10-CM | POA: Diagnosis not present

## 2019-03-25 DIAGNOSIS — I10 Essential (primary) hypertension: Secondary | ICD-10-CM | POA: Diagnosis not present

## 2019-03-25 DIAGNOSIS — Z299 Encounter for prophylactic measures, unspecified: Secondary | ICD-10-CM | POA: Diagnosis not present

## 2019-03-26 DIAGNOSIS — E7800 Pure hypercholesterolemia, unspecified: Secondary | ICD-10-CM | POA: Diagnosis not present

## 2019-03-26 DIAGNOSIS — I10 Essential (primary) hypertension: Secondary | ICD-10-CM | POA: Diagnosis not present

## 2019-03-26 DIAGNOSIS — F329 Major depressive disorder, single episode, unspecified: Secondary | ICD-10-CM | POA: Diagnosis not present

## 2019-04-24 DIAGNOSIS — I10 Essential (primary) hypertension: Secondary | ICD-10-CM | POA: Diagnosis not present

## 2019-04-24 DIAGNOSIS — F329 Major depressive disorder, single episode, unspecified: Secondary | ICD-10-CM | POA: Diagnosis not present

## 2019-04-24 DIAGNOSIS — E7800 Pure hypercholesterolemia, unspecified: Secondary | ICD-10-CM | POA: Diagnosis not present

## 2019-05-13 DIAGNOSIS — M351 Other overlap syndromes: Secondary | ICD-10-CM | POA: Diagnosis not present

## 2019-05-13 DIAGNOSIS — M2550 Pain in unspecified joint: Secondary | ICD-10-CM | POA: Diagnosis not present

## 2019-05-13 DIAGNOSIS — E663 Overweight: Secondary | ICD-10-CM | POA: Diagnosis not present

## 2019-05-13 DIAGNOSIS — M3500 Sicca syndrome, unspecified: Secondary | ICD-10-CM | POA: Diagnosis not present

## 2019-05-13 DIAGNOSIS — Z6826 Body mass index (BMI) 26.0-26.9, adult: Secondary | ICD-10-CM | POA: Diagnosis not present

## 2019-05-13 DIAGNOSIS — I7300 Raynaud's syndrome without gangrene: Secondary | ICD-10-CM | POA: Diagnosis not present

## 2019-05-13 DIAGNOSIS — M150 Primary generalized (osteo)arthritis: Secondary | ICD-10-CM | POA: Diagnosis not present

## 2019-05-13 DIAGNOSIS — M797 Fibromyalgia: Secondary | ICD-10-CM | POA: Diagnosis not present

## 2019-05-13 DIAGNOSIS — R768 Other specified abnormal immunological findings in serum: Secondary | ICD-10-CM | POA: Diagnosis not present

## 2019-05-15 DIAGNOSIS — Z6827 Body mass index (BMI) 27.0-27.9, adult: Secondary | ICD-10-CM | POA: Diagnosis not present

## 2019-05-15 DIAGNOSIS — H9319 Tinnitus, unspecified ear: Secondary | ICD-10-CM | POA: Diagnosis not present

## 2019-05-15 DIAGNOSIS — Z299 Encounter for prophylactic measures, unspecified: Secondary | ICD-10-CM | POA: Diagnosis not present

## 2019-05-15 DIAGNOSIS — I10 Essential (primary) hypertension: Secondary | ICD-10-CM | POA: Diagnosis not present

## 2019-05-15 DIAGNOSIS — M351 Other overlap syndromes: Secondary | ICD-10-CM | POA: Diagnosis not present

## 2019-05-15 DIAGNOSIS — E7800 Pure hypercholesterolemia, unspecified: Secondary | ICD-10-CM | POA: Diagnosis not present

## 2019-05-22 DIAGNOSIS — I10 Essential (primary) hypertension: Secondary | ICD-10-CM | POA: Diagnosis not present

## 2019-05-22 DIAGNOSIS — F329 Major depressive disorder, single episode, unspecified: Secondary | ICD-10-CM | POA: Diagnosis not present

## 2019-05-22 DIAGNOSIS — E7800 Pure hypercholesterolemia, unspecified: Secondary | ICD-10-CM | POA: Diagnosis not present

## 2019-07-02 DIAGNOSIS — I10 Essential (primary) hypertension: Secondary | ICD-10-CM | POA: Diagnosis not present

## 2019-07-02 DIAGNOSIS — F329 Major depressive disorder, single episode, unspecified: Secondary | ICD-10-CM | POA: Diagnosis not present

## 2019-07-02 DIAGNOSIS — E7800 Pure hypercholesterolemia, unspecified: Secondary | ICD-10-CM | POA: Diagnosis not present

## 2019-08-01 ENCOUNTER — Ambulatory Visit (INDEPENDENT_AMBULATORY_CARE_PROVIDER_SITE_OTHER): Payer: Medicare Other | Admitting: Cardiology

## 2019-08-01 ENCOUNTER — Other Ambulatory Visit: Payer: Self-pay

## 2019-08-01 ENCOUNTER — Encounter: Payer: Self-pay | Admitting: Cardiology

## 2019-08-01 ENCOUNTER — Encounter: Payer: Self-pay | Admitting: *Deleted

## 2019-08-01 VITALS — BP 142/78 | HR 79 | Ht 63.5 in | Wt 150.0 lb

## 2019-08-01 DIAGNOSIS — E7800 Pure hypercholesterolemia, unspecified: Secondary | ICD-10-CM | POA: Diagnosis not present

## 2019-08-01 DIAGNOSIS — I10 Essential (primary) hypertension: Secondary | ICD-10-CM | POA: Diagnosis not present

## 2019-08-01 DIAGNOSIS — R001 Bradycardia, unspecified: Secondary | ICD-10-CM | POA: Diagnosis not present

## 2019-08-01 DIAGNOSIS — F329 Major depressive disorder, single episode, unspecified: Secondary | ICD-10-CM | POA: Diagnosis not present

## 2019-08-01 DIAGNOSIS — R0602 Shortness of breath: Secondary | ICD-10-CM | POA: Diagnosis not present

## 2019-08-01 NOTE — Progress Notes (Signed)
Clinical Summary Susan Hicks is a 73 y.o.female  seen today for follow up of the following medical problems.   1. SOB -previosuly reported an episode of sudden breathlessness. Occurred while at home at rest. No chest pain. Felt weak all over. Layed down and checked pulse, felt irregular. Checked by her family that is a paramedic, checked and also irregular, didrhythm strip at the timeand she wastoldshehadPACs.  - symptosm last about 1-2hours - no recurrent episodes. No otherwise palpitations - does heavy hardwork, tolerates well   - no recent SOB or DOE    2. Sinus bradycardia/Conduction disease - seen by EP 03/2016. Bradycardia resolved off metoprolol - during 08/2018 admit episodes of rate related LBBB, accelerated idioventricular rhythm, mobitz I  - no recent dizziness or lightheadedness   3. OSA - sleeps with OSA  4. HTN - home bp's typcially around 110s-120s/60s-80s     Past Medical History:  Diagnosis Date  . Depression   . Fibromyalgia    now considered mixed connective tissue disorder  . Generalized anxiety disorder 08/21/2018  . GERD (gastroesophageal reflux disease)   . Hypertension   . Irritable bowel   . Junctional bradycardia    a. 2017 - beta blocker discontinued.  . Mixed connective tissue disease ([place])   . Obstructive sleep apnea treated with continuous positive airway pressure (CPAP) 08/21/2018  . Osteoporosis   . Premature atrial contractions   . Primary localized osteoarthritis of right knee      Allergies  Allergen Reactions  . Lisinopril Cough  . Beta Adrenergic Blockers Other (See Comments)    Bradycardia - bottomed out heart rate   . Lactose Intolerance (Gi) Diarrhea    cramps  . Norvasc [Amlodipine] Other (See Comments)    HEADACHE      Current Outpatient Medications  Medication Sig Dispense Refill  . aspirin 325 MG EC tablet 1 tab a day for the next 30 days to prevent blood clots 30 tablet 0  . busPIRone  (BUSPAR) 15 MG tablet Take 15 mg by mouth 2 (two) times daily.    . Ca Phosphate-Cholecalciferol (CALCIUM/VITAMIN D3 GUMMIES PO) Take 1 each by mouth 2 (two) times daily.    . carboxymethylcellulose (REFRESH PLUS) 0.5 % SOLN Place 1 drop into both eyes 3 (three) times daily.    . cetirizine (ZYRTEC) 10 MG tablet Take 10 mg by mouth daily.    . Cholecalciferol (VITAMIN D3) 2000 units TABS Take 2,000 Units by mouth daily.     Marland Kitchen docusate sodium (COLACE) 100 MG capsule 1 tab 2 times a day while on narcotics.  STOOL SOFTENER 60 capsule 0  . gabapentin (NEURONTIN) 300 MG capsule Take 1 capsule (300 mg total) by mouth at bedtime. 30 capsule 0  . HYDROcodone-acetaminophen (NORCO/VICODIN) 5-325 MG tablet Take 1 tablet by mouth every 4 (four) hours as needed for moderate pain. 42 tablet 0  . hydroxychloroquine (PLAQUENIL) 200 MG tablet Take 200 mg by mouth 2 (two) times daily.  5  . LORazepam (ATIVAN) 0.5 MG tablet Take 0.5 mg by mouth at bedtime.    . Magnesium 250 MG TABS Take 250 mg by mouth at bedtime.    Marland Kitchen oxybutynin (DITROPAN) 5 MG tablet Take 5 mg by mouth daily.    . pantoprazole (PROTONIX) 40 MG tablet Take 40 mg by mouth daily.    . polyethylene glycol (MIRALAX / GLYCOLAX) packet 17grams in 6 oz of something to drink twice a day until bowel movement.  LAXITIVE.  Restart if two days since last bowel movement 14 each 0  . PRESCRIPTION MEDICATION Inhale into the lungs at bedtime. CPAP    . sertraline (ZOLOFT) 100 MG tablet Take 150 mg by mouth daily.    . valsartan-hydrochlorothiazide (DIOVAN-HCT) 160-25 MG tablet Take 1 tablet by mouth daily.     No current facility-administered medications for this visit.      Past Surgical History:  Procedure Laterality Date  . BACK SURGERY    . CHOLECYSTECTOMY    . HAND SURGERY  2006  . TOTAL KNEE ARTHROPLASTY Right 09/02/2018   Procedure: TOTAL KNEE ARTHROPLASTY;  Surgeon: Elsie Saas, MD;  Location: [facility];  Service: Orthopedics;  Laterality: Right;      Allergies  Allergen Reactions  . Lisinopril Cough  . Beta Adrenergic Blockers Other (See Comments)    Bradycardia - bottomed out heart rate   . Lactose Intolerance (Gi) Diarrhea    cramps  . Norvasc [Amlodipine] Other (See Comments)    HEADACHE       Family History  Problem Relation Age of Onset  . Heart attack Father   . Pulmonary disease Father   . Hypertension Father   . Thyroid disease Mother   . Hypertension Mother   . Thyroid disease Sister   . Hypertension Sister   . Hypertension Daughter   . Hypertension Son      Social History Susan Hicks reports that she has never smoked. She has never used smokeless tobacco. Susan Hicks reports no history of alcohol use.   Review of Systems CONSTITUTIONAL: No weight loss, fever, chills, weakness or fatigue.  HEENT: Eyes: No visual loss, blurred vision, double vision or yellow sclerae.No hearing loss, sneezing, congestion, runny nose or sore throat.  SKIN: No rash or itching.  CARDIOVASCULAR: per hpi RESPIRATORY: No shortness of breath, cough or sputum.  GASTROINTESTINAL: No anorexia, nausea, vomiting or diarrhea. No abdominal pain or blood.  GENITOURINARY: No burning on urination, no polyuria NEUROLOGICAL: No headache, dizziness, syncope, paralysis, ataxia, numbness or tingling in the extremities. No change in bowel or bladder control.  MUSCULOSKELETAL: No muscle, back pain, joint pain or stiffness.  LYMPHATICS: No enlarged nodes. No history of splenectomy.  PSYCHIATRIC: No history of depression or anxiety.  ENDOCRINOLOGIC: No reports of sweating, cold or heat intolerance. No polyuria or polydipsia.  Marland Kitchen   Physical Examination Today's Vitals   08/01/19 1516  BP: (!) 142/78  Pulse: 79  SpO2: 97%  Weight: 150 lb (68 kg)  Height: 5' 3.5" (1.613 m)   Body mass index is 26.15 kg/m.  Gen: resting comfortably, no acute distress HEENT: no scleral icterus, pupils equal round and reactive, no palptable cervical  adenopathy,  CV: RRR, no mr/g, no jvd Resp: Clear to auscultation bilaterally GI: abdomen is soft, non-tender, non-distended, normal bowel sounds, no hepatosplenomegaly MSK: extremities are warm, no edema.  Skin: warm, no rash Neuro:  no focal deficits Psych: appropriate affect   Diagnostic Studies  08/2018 echo Study Conclusions  - Left ventricle: The cavity size was normal. Systolic function was   normal. Wall motion was normal; there were no regional wall   motion abnormalities. Doppler parameters are consistent with   abnormal left ventricular relaxation (grade 1 diastolic   dysfunction). - Aortic valve: There was no regurgitation. - Mitral valve: Calcified annulus. Mildly thickened leaflets .   There was mild regurgitation. - Right ventricle: The cavity size was normal. Wall thickness was   normal. Systolic function was normal. -  Right atrium: The atrium was normal in size. - Tricuspid valve: There was mild regurgitation. - Pulmonary arteries: Systolic pressure was within the normal   range. - Inferior vena cava: The vessel was normal in size. - Pericardium, extracardiac: There was no pericardial effusion.   Assessment and Plan  1. SOB - prior episodes reproted at prior visits but no recent issues - continue to monitor.    2. Bradycardia/Conduction disease - no significant symptoms, continue to monitor at this time.  - EKG today shows SR first degree AV block  Request labs from pcp  F/u 1 year       Arnoldo Lenis, M.D

## 2019-08-01 NOTE — Patient Instructions (Signed)

## 2019-08-05 ENCOUNTER — Encounter: Payer: Self-pay | Admitting: *Deleted

## 2019-08-15 ENCOUNTER — Ambulatory Visit: Payer: Medicare Other | Admitting: Cardiology

## 2019-08-15 DIAGNOSIS — G4700 Insomnia, unspecified: Secondary | ICD-10-CM | POA: Diagnosis not present

## 2019-08-15 DIAGNOSIS — F322 Major depressive disorder, single episode, severe without psychotic features: Secondary | ICD-10-CM | POA: Diagnosis not present

## 2019-08-15 DIAGNOSIS — I10 Essential (primary) hypertension: Secondary | ICD-10-CM | POA: Diagnosis not present

## 2019-08-15 DIAGNOSIS — Z6827 Body mass index (BMI) 27.0-27.9, adult: Secondary | ICD-10-CM | POA: Diagnosis not present

## 2019-08-15 DIAGNOSIS — G4730 Sleep apnea, unspecified: Secondary | ICD-10-CM | POA: Diagnosis not present

## 2019-08-15 DIAGNOSIS — Z299 Encounter for prophylactic measures, unspecified: Secondary | ICD-10-CM | POA: Diagnosis not present

## 2019-08-15 DIAGNOSIS — F329 Major depressive disorder, single episode, unspecified: Secondary | ICD-10-CM | POA: Diagnosis not present

## 2019-09-19 DIAGNOSIS — E7800 Pure hypercholesterolemia, unspecified: Secondary | ICD-10-CM | POA: Diagnosis not present

## 2019-09-19 DIAGNOSIS — F329 Major depressive disorder, single episode, unspecified: Secondary | ICD-10-CM | POA: Diagnosis not present

## 2019-09-19 DIAGNOSIS — I10 Essential (primary) hypertension: Secondary | ICD-10-CM | POA: Diagnosis not present

## 2019-10-22 DIAGNOSIS — E7800 Pure hypercholesterolemia, unspecified: Secondary | ICD-10-CM | POA: Diagnosis not present

## 2019-10-22 DIAGNOSIS — F329 Major depressive disorder, single episode, unspecified: Secondary | ICD-10-CM | POA: Diagnosis not present

## 2019-10-22 DIAGNOSIS — I10 Essential (primary) hypertension: Secondary | ICD-10-CM | POA: Diagnosis not present

## 2019-11-08 DIAGNOSIS — Z23 Encounter for immunization: Secondary | ICD-10-CM | POA: Diagnosis not present

## 2019-11-13 DIAGNOSIS — I7300 Raynaud's syndrome without gangrene: Secondary | ICD-10-CM | POA: Diagnosis not present

## 2019-11-13 DIAGNOSIS — M3500 Sicca syndrome, unspecified: Secondary | ICD-10-CM | POA: Diagnosis not present

## 2019-11-13 DIAGNOSIS — R768 Other specified abnormal immunological findings in serum: Secondary | ICD-10-CM | POA: Diagnosis not present

## 2019-11-13 DIAGNOSIS — M150 Primary generalized (osteo)arthritis: Secondary | ICD-10-CM | POA: Diagnosis not present

## 2019-11-13 DIAGNOSIS — M2550 Pain in unspecified joint: Secondary | ICD-10-CM | POA: Diagnosis not present

## 2019-11-13 DIAGNOSIS — M797 Fibromyalgia: Secondary | ICD-10-CM | POA: Diagnosis not present

## 2019-11-13 DIAGNOSIS — M351 Other overlap syndromes: Secondary | ICD-10-CM | POA: Diagnosis not present

## 2019-11-14 DIAGNOSIS — I10 Essential (primary) hypertension: Secondary | ICD-10-CM | POA: Diagnosis not present

## 2019-11-14 DIAGNOSIS — F322 Major depressive disorder, single episode, severe without psychotic features: Secondary | ICD-10-CM | POA: Diagnosis not present

## 2019-11-14 DIAGNOSIS — Z299 Encounter for prophylactic measures, unspecified: Secondary | ICD-10-CM | POA: Diagnosis not present

## 2019-11-14 DIAGNOSIS — G4700 Insomnia, unspecified: Secondary | ICD-10-CM | POA: Diagnosis not present

## 2019-11-14 DIAGNOSIS — Z6827 Body mass index (BMI) 27.0-27.9, adult: Secondary | ICD-10-CM | POA: Diagnosis not present

## 2019-11-14 DIAGNOSIS — M797 Fibromyalgia: Secondary | ICD-10-CM | POA: Diagnosis not present

## 2019-11-14 DIAGNOSIS — D692 Other nonthrombocytopenic purpura: Secondary | ICD-10-CM | POA: Diagnosis not present

## 2019-11-26 DIAGNOSIS — E7800 Pure hypercholesterolemia, unspecified: Secondary | ICD-10-CM | POA: Diagnosis not present

## 2019-11-26 DIAGNOSIS — F329 Major depressive disorder, single episode, unspecified: Secondary | ICD-10-CM | POA: Diagnosis not present

## 2019-11-26 DIAGNOSIS — I10 Essential (primary) hypertension: Secondary | ICD-10-CM | POA: Diagnosis not present

## 2019-12-08 DIAGNOSIS — Z23 Encounter for immunization: Secondary | ICD-10-CM | POA: Diagnosis not present

## 2019-12-17 DIAGNOSIS — I10 Essential (primary) hypertension: Secondary | ICD-10-CM | POA: Diagnosis not present

## 2019-12-17 DIAGNOSIS — E7800 Pure hypercholesterolemia, unspecified: Secondary | ICD-10-CM | POA: Diagnosis not present

## 2019-12-17 DIAGNOSIS — F329 Major depressive disorder, single episode, unspecified: Secondary | ICD-10-CM | POA: Diagnosis not present

## 2019-12-23 DIAGNOSIS — R5383 Other fatigue: Secondary | ICD-10-CM | POA: Diagnosis not present

## 2019-12-23 DIAGNOSIS — Z79899 Other long term (current) drug therapy: Secondary | ICD-10-CM | POA: Diagnosis not present

## 2019-12-23 DIAGNOSIS — I10 Essential (primary) hypertension: Secondary | ICD-10-CM | POA: Diagnosis not present

## 2020-03-12 DIAGNOSIS — M25552 Pain in left hip: Secondary | ICD-10-CM | POA: Diagnosis not present

## 2020-03-12 DIAGNOSIS — M545 Low back pain: Secondary | ICD-10-CM | POA: Diagnosis not present

## 2020-03-14 DIAGNOSIS — F329 Major depressive disorder, single episode, unspecified: Secondary | ICD-10-CM | POA: Diagnosis not present

## 2020-03-14 DIAGNOSIS — I10 Essential (primary) hypertension: Secondary | ICD-10-CM | POA: Diagnosis not present

## 2020-03-14 DIAGNOSIS — E7800 Pure hypercholesterolemia, unspecified: Secondary | ICD-10-CM | POA: Diagnosis not present

## 2020-03-18 ENCOUNTER — Other Ambulatory Visit (HOSPITAL_COMMUNITY): Payer: Self-pay | Admitting: Orthopedic Surgery

## 2020-03-18 DIAGNOSIS — M4807 Spinal stenosis, lumbosacral region: Secondary | ICD-10-CM

## 2020-03-18 DIAGNOSIS — M25551 Pain in right hip: Secondary | ICD-10-CM

## 2020-03-30 DIAGNOSIS — E2839 Other primary ovarian failure: Secondary | ICD-10-CM | POA: Diagnosis not present

## 2020-04-08 ENCOUNTER — Ambulatory Visit (HOSPITAL_COMMUNITY)
Admission: RE | Admit: 2020-04-08 | Discharge: 2020-04-08 | Disposition: A | Discharge status: Home / Self Care | Payer: Medicare Other | Source: Ambulatory Visit | Attending: Orthopedic Surgery | Admitting: Orthopedic Surgery

## 2020-04-08 ENCOUNTER — Other Ambulatory Visit: Payer: Self-pay

## 2020-04-08 DIAGNOSIS — M25551 Pain in right hip: Secondary | ICD-10-CM | POA: Diagnosis present

## 2020-04-08 DIAGNOSIS — M4807 Spinal stenosis, lumbosacral region: Secondary | ICD-10-CM | POA: Insufficient documentation

## 2020-04-12 ENCOUNTER — Ambulatory Visit (INDEPENDENT_AMBULATORY_CARE_PROVIDER_SITE_OTHER): Payer: Medicare Other | Admitting: Family Medicine

## 2020-04-12 ENCOUNTER — Encounter: Payer: Self-pay | Admitting: Family Medicine

## 2020-04-12 VITALS — BP 142/68 | HR 75 | Ht 62.0 in | Wt 146.8 lb

## 2020-04-12 DIAGNOSIS — R5383 Other fatigue: Secondary | ICD-10-CM

## 2020-04-12 DIAGNOSIS — R001 Bradycardia, unspecified: Secondary | ICD-10-CM

## 2020-04-12 NOTE — Patient Instructions (Addendum)
Medication Instructions:   Your physician recommends that you continue on your current medications as directed. Please refer to the Current Medication list given to you today. *If you need a refill on your cardiac medications before your next appointment, please call your pharmacy*   Lab Work:  NONE If you have labs (blood work) drawn today and your tests are completely normal, you will receive your results only by: Marland Kitchen MyChart Message (if you have MyChart) OR . A paper copy in the mail If you have any lab test that is abnormal or we need to change your treatment, we will call you to review the results.   Testing/Procedures: Your physician has recommended that you wear an event monitor for 30 days. Event monitors are medical devices that record the heart's electrical activity. Doctors most often [place] these monitors to diagnose arrhythmias. Arrhythmias are problems with the speed or rhythm of the heartbeat. The monitor is a small, portable device. You can wear one while you do your normal daily activities. This is usually used to diagnose what is causing palpitations/syncope (passing out). [employer] will contact about this monitor.    Follow-Up: At [facility], you and your health needs are our priority.  As part of our continuing mission to provide you with exceptional heart care, we have created designated Provider Care Teams.  These Care Teams include your primary Cardiologist (physician) and Advanced Practice Providers (APPs -  Physician Assistants and Nurse Practitioners) who all work together to provide you with the care you need, when you need it.  We recommend signing up for the patient portal called "MyChart".  Sign up information is provided on this After Visit Summary.  MyChart is used to connect with patients for Virtual Visits (Telemedicine).  Patients are able to view lab/test results, encounter notes, upcoming appointments, etc.  Non-urgent messages can be sent  to your provider as well.   To learn more about what you can do with MyChart, go to [email or web address].    Your next appointment:    Two months  The format for your next appointment:    In Person  Provider:   You may see Carlyle Dolly, MD or the following Advanced Practice Provider on your designated Care Team:    Katina Dung, NP

## 2020-04-12 NOTE — Progress Notes (Signed)
Cardiology Office Note  Date: 04/12/2020   ID: FAJR FIFE, DOB Jun 07, 1946, MRN [id number]  PCP:  Glenda Chroman, MD  Cardiologist:  Carlyle Dolly, MD Electrophysiologist:  Cristopher Peru, MD   Chief Complaint: Follow-up shortness of breath  History of Present Illness: Susan Hicks is a 74 y.o. female with a history of shortness of breath, sinus bradycardia/conduction disease, OSA, hypertension.  Last saw Dr. Harl Bowie 08/01/2019 where she reported an episode of sudden breathlessness occurring at home while at rest.  She denied any chest pain but felt weak all over.  She laid down and checked her pulse which felt irregular.  She had had no recurrent episodes.  At time of visit she had no shortness of breath or DOE.  She had no recent dizziness or lightheadedness.  She was using her CPAP.  Her blood pressures run around 110s to 120s over 60s to 80s.  Patient states she has had some episodes of slow heart rate in the 40s and feeling significantly fatigued when these episodes occur.  She denies any near syncopal or syncopal episodes.  She denies any palpitations or tachyarrhythmias.  She says when these episodes occur she has no energy at all and can barely perform any activities of daily living.  She denies any anginal symptoms.  States she has regularly had her thyroid checked with no information regarding low thyroid function.  She did say her vitamin D dosage was recently increased.  She had an EKG on arrival showing normal sinus rhythm with a first-degree AV block rate of 79 and LBBB.   Past Medical History:  Diagnosis Date   Depression    Fibromyalgia    now considered mixed connective tissue disorder   Generalized anxiety disorder 08/21/2018   GERD (gastroesophageal reflux disease)    Hypertension    Irritable bowel    Junctional bradycardia    a. 2017 - beta blocker discontinued.   Mixed connective tissue disease ([place])    Obstructive sleep apnea treated  with continuous positive airway pressure (CPAP) 08/21/2018   Osteoporosis    Premature atrial contractions    Primary localized osteoarthritis of right knee     Past Surgical History:  Procedure Laterality Date   BACK SURGERY     CHOLECYSTECTOMY     HAND SURGERY  2006   TOTAL KNEE ARTHROPLASTY Right 09/02/2018   Procedure: TOTAL KNEE ARTHROPLASTY;  Surgeon: Elsie Saas, MD;  Location: [facility];  Service: Orthopedics;  Laterality: Right;    Current Outpatient Medications  Medication Sig Dispense Refill   acetaminophen (TYLENOL) 500 MG tablet Take 500 mg by mouth at bedtime.     aspirin EC 81 MG tablet Take 81 mg by mouth daily.     busPIRone (BUSPAR) 15 MG tablet Take 15 mg by mouth 2 (two) times daily.     carboxymethylcellulose (REFRESH PLUS) 0.5 % SOLN Place 1 drop into both eyes 3 (three) times daily.     cetirizine (ZYRTEC) 10 MG tablet Take 10 mg by mouth daily.     Cholecalciferol (VITAMIN D3) 2000 units TABS Take 2,000 Units by mouth daily.      diphenhydramine-acetaminophen (TYLENOL PM) 25-500 MG TABS tablet Take 1 tablet by mouth at bedtime as needed.     hydroxychloroquine (PLAQUENIL) 200 MG tablet Take 200 mg by mouth 2 (two) times daily.  5   LORazepam (ATIVAN) 0.5 MG tablet Take 0.5 mg by mouth at bedtime.     Magnesium 400 MG CAPS  Take 1 capsule by mouth at bedtime.     melatonin 5 MG TABS Take 5 mg by mouth at bedtime.     meloxicam (MOBIC) 7.5 MG tablet Take 7.5 mg by mouth in the morning and at bedtime.     oxybutynin (DITROPAN) 5 MG tablet Take 5 mg by mouth daily.     pantoprazole (PROTONIX) 40 MG tablet Take 40 mg by mouth daily.     polyethylene glycol (MIRALAX / GLYCOLAX) packet 17grams in 6 oz of something to drink twice a day until bowel movement.  LAXITIVE.  Restart if two days since last bowel movement 14 each 0   PRESCRIPTION MEDICATION Inhale into the lungs at bedtime. CPAP     sertraline (ZOLOFT) 100 MG tablet Take 150 mg by mouth  daily.     traMADol (ULTRAM) 50 MG tablet Take 50 mg by mouth every 6 (six) hours as needed for moderate pain (hip / back pain).     TURMERIC PO Take 1 tablet by mouth at bedtime.     valsartan-hydrochlorothiazide (DIOVAN-HCT) 160-25 MG tablet Take 1 tablet by mouth daily.     No current facility-administered medications for this visit.   Allergies:  Lisinopril, Beta adrenergic blockers, Lactose intolerance (gi), Penicillins, and Norvasc [amlodipine]   Social History: The patient  reports that she has never smoked. She has never used smokeless tobacco. She reports that she does not drink alcohol and does not use drugs.   Family History: The patient's family history includes Heart attack in her father; Hypertension in her daughter, father, mother, sister, and son; Pulmonary disease in her father; Thyroid disease in her mother and sister.   ROS:  Please see the history of present illness. Otherwise, complete review of systems is positive for none.  All other systems are reviewed and negative.   Physical Exam: VS:  BP (!) 142/68    Pulse 75    Ht 5\' 2"  (1.575 m)    Wt 146 lb 12.8 oz (66.6 kg)    SpO2 96%    BMI 26.85 kg/m , BMI Body mass index is 26.85 kg/m.  Wt Readings from Last 3 Encounters:  04/12/20 146 lb 12.8 oz (66.6 kg)  08/01/19 150 lb (68 kg)  09/02/18 148 lb (67.1 kg)    General: Patient appears comfortable at rest. Neck: Supple, no elevated JVP or carotid bruits, no thyromegaly. Lungs: Clear to auscultation, nonlabored breathing at rest. Cardiac: Regular rate and rhythm, no S3 or significant systolic murmur, no pericardial rub. Extremities: No pitting edema, distal pulses 2+. Skin: Warm and dry. Musculoskeletal: No kyphosis. Neuropsychiatric: Alert and oriented x3, affect grossly appropriate.  ECG:  An ECG dated 04/12/2020 was personally reviewed today and demonstrated:  Sinus rhythm first-degree AV block rate of 79 left bundle branch block  Recent Labwork: No results  found for requested labs within last 8760 hours.     Component Value Date/Time   CHOL 203 (H) 04/13/2016 1102   TRIG 94 04/13/2016 1102   HDL 59 04/13/2016 1102   CHOLHDL 3.4 04/13/2016 1102   VLDL 19 04/13/2016 1102   LDLCALC 125 (H) 04/13/2016 1102    Other Studies Reviewed Today:   Assessment and Plan:  1. Other fatigue   2. Bradycardia     1. Other fatigue Patient states she is having issues with significant fatigue especially when her heart rate is slow.  She denies any near syncopal or syncopal episodes.  She denies any lightheadedness or dizziness.  States  she recently had her vitamin D dosage increased.  States her thyroid lab work in the past has been normal.  CBC in April of last year 11.5 and 35.5 hemoglobin and hematocrit.  TSH 1.23 November 2018.  2. Bradycardia Patient states she has been having episodes of slow heart rate in the low 40s recently and experiencing extreme fatigue when these episodes occur.  Get a 30-day event monitor to check for symptomatic bradycardia, pauses, sinus node dysfunction.  Medication Adjustments/Labs and Tests Ordered: Current medicines are reviewed at length with the patient today.  Concerns regarding medicines are outlined above.   Disposition: Follow-up with Dr. Harl Bowie or APP 2 months  Signed, Levell July, NP 04/12/2020 3:47 PM    [facility] [place], [place], [place] [address] Phone: [phone]; Fax: [phone]

## 2020-04-14 DIAGNOSIS — I10 Essential (primary) hypertension: Secondary | ICD-10-CM | POA: Diagnosis not present

## 2020-04-14 DIAGNOSIS — E7800 Pure hypercholesterolemia, unspecified: Secondary | ICD-10-CM | POA: Diagnosis not present

## 2020-04-20 ENCOUNTER — Ambulatory Visit (INDEPENDENT_AMBULATORY_CARE_PROVIDER_SITE_OTHER): Payer: Medicare Other

## 2020-04-20 DIAGNOSIS — R001 Bradycardia, unspecified: Secondary | ICD-10-CM

## 2020-04-27 ENCOUNTER — Telehealth: Payer: Self-pay | Admitting: *Deleted

## 2020-04-27 DIAGNOSIS — I441 Atrioventricular block, second degree: Secondary | ICD-10-CM

## 2020-04-27 NOTE — Telephone Encounter (Signed)
[employer] reported critical pt trigger on event monitor 2nd degree AVB type 2 HR 44 - gave report to Katina Dung, NP for review who recommends pt needs EP evaluation - will place referral and forward to schedulers - LM for pt to return call

## 2020-04-27 NOTE — Telephone Encounter (Signed)
Pt returned call and made aware - will await appt for EP

## 2020-05-05 DIAGNOSIS — H40033 Anatomical narrow angle, bilateral: Secondary | ICD-10-CM | POA: Diagnosis not present

## 2020-05-05 DIAGNOSIS — H10013 Acute follicular conjunctivitis, bilateral: Secondary | ICD-10-CM | POA: Diagnosis not present

## 2020-05-13 DIAGNOSIS — I7300 Raynaud's syndrome without gangrene: Secondary | ICD-10-CM | POA: Diagnosis not present

## 2020-05-13 DIAGNOSIS — M797 Fibromyalgia: Secondary | ICD-10-CM | POA: Diagnosis not present

## 2020-05-13 DIAGNOSIS — R001 Bradycardia, unspecified: Secondary | ICD-10-CM | POA: Diagnosis not present

## 2020-05-13 DIAGNOSIS — M2550 Pain in unspecified joint: Secondary | ICD-10-CM | POA: Diagnosis not present

## 2020-05-13 DIAGNOSIS — M351 Other overlap syndromes: Secondary | ICD-10-CM | POA: Diagnosis not present

## 2020-05-13 DIAGNOSIS — E663 Overweight: Secondary | ICD-10-CM | POA: Diagnosis not present

## 2020-05-13 DIAGNOSIS — M150 Primary generalized (osteo)arthritis: Secondary | ICD-10-CM | POA: Diagnosis not present

## 2020-05-13 DIAGNOSIS — Z6825 Body mass index (BMI) 25.0-25.9, adult: Secondary | ICD-10-CM | POA: Diagnosis not present

## 2020-05-13 DIAGNOSIS — M3500 Sicca syndrome, unspecified: Secondary | ICD-10-CM | POA: Diagnosis not present

## 2020-05-13 DIAGNOSIS — M25551 Pain in right hip: Secondary | ICD-10-CM | POA: Diagnosis not present

## 2020-05-14 DIAGNOSIS — I10 Essential (primary) hypertension: Secondary | ICD-10-CM | POA: Diagnosis not present

## 2020-05-14 DIAGNOSIS — E7800 Pure hypercholesterolemia, unspecified: Secondary | ICD-10-CM | POA: Diagnosis not present

## 2020-05-19 ENCOUNTER — Encounter: Payer: Self-pay | Admitting: Internal Medicine

## 2020-05-19 ENCOUNTER — Telehealth (INDEPENDENT_AMBULATORY_CARE_PROVIDER_SITE_OTHER): Payer: Medicare Other | Admitting: Internal Medicine

## 2020-05-19 ENCOUNTER — Other Ambulatory Visit: Payer: Self-pay

## 2020-05-19 DIAGNOSIS — I441 Atrioventricular block, second degree: Secondary | ICD-10-CM

## 2020-05-19 DIAGNOSIS — R5383 Other fatigue: Secondary | ICD-10-CM

## 2020-05-19 DIAGNOSIS — I10 Essential (primary) hypertension: Secondary | ICD-10-CM | POA: Diagnosis not present

## 2020-05-19 DIAGNOSIS — G4733 Obstructive sleep apnea (adult) (pediatric): Secondary | ICD-10-CM

## 2020-05-19 DIAGNOSIS — R001 Bradycardia, unspecified: Secondary | ICD-10-CM

## 2020-05-19 NOTE — Progress Notes (Signed)
Electrophysiology TeleHealth Note   Due to national recommendations of social distancing due to [place] 19, Audio/video telehealth visit is felt to be most appropriate for this patient at this time.  See MyChart message from today for patient consent regarding telehealth for [facility].   Date:  05/19/2020   ID:  Susan Hicks, DOB 06/02/1946, MRN [id number]  Location: home  Provider location: [place] Evaluation Performed: New patient consult  PCP:  Glenda Chroman, MD  Cardiologist:  Carlyle Dolly, MD  Electrophysiologist:  Cristopher Peru, MD   Chief Complaint:  AV block  History of Present Illness:    Susan Hicks is a 74 y.o. female who presents via audio/video conferencing for a telehealth visit today.   The patient is referred for new consultation regarding bradycardia by Dr Harl Bowie and Katina Dung. The patient has a h/o sinus bradycardia with heart rates 40s and symptoms of fatigue and weakness.  She has recently worn a monitor (not currently available) which has also documented second degree AV Block.  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   Today, she denies symptoms of palpitations, chest pain, shortness of breath, orthopnea, PND, lower extremity edema, claudication, dizziness, presyncope, syncope, bleeding, or neurologic sequela. The patient is tolerating medications without difficulties and is otherwise without complaint today.     Past Medical History:  Diagnosis Date  . Depression   . Fibromyalgia    now considered mixed connective tissue disorder  . Generalized anxiety disorder 08/21/2018  . GERD (gastroesophageal reflux disease)   . Hypertension   . Irritable bowel   . Junctional bradycardia    a. 2017 - beta blocker discontinued.  Marland Kitchen LBBB (left bundle branch block)   . Mixed connective tissue disease ([place])   . Obstructive sleep apnea treated with continuous positive airway pressure (CPAP) 08/21/2018  . Osteoporosis   .  Premature atrial contractions   . Primary localized osteoarthritis of right knee     Past Surgical History:  Procedure Laterality Date  . BACK SURGERY    . CHOLECYSTECTOMY    . HAND SURGERY  2006  . TOTAL KNEE ARTHROPLASTY Right 09/02/2018   Procedure: TOTAL KNEE ARTHROPLASTY;  Surgeon: Elsie Saas, MD;  Location: [place];  Service: Orthopedics;  Laterality: Right;    Current Outpatient Medications  Medication Sig Dispense Refill  . acetaminophen (TYLENOL) 500 MG tablet Take 500 mg by mouth at bedtime.    Marland Kitchen aspirin EC 81 MG tablet Take 81 mg by mouth daily.    . busPIRone (BUSPAR) 15 MG tablet Take 15 mg by mouth 2 (two) times daily.    . carboxymethylcellulose (REFRESH PLUS) 0.5 % SOLN Place 1 drop into both eyes 3 (three) times daily.    . cetirizine (ZYRTEC) 10 MG tablet Take 10 mg by mouth daily.    . Cholecalciferol (VITAMIN D3) 2000 units TABS Take 2,000 Units by mouth daily.     . diphenhydramine-acetaminophen (TYLENOL PM) 25-500 MG TABS tablet Take 1 tablet by mouth at bedtime as needed.    . hydroxychloroquine (PLAQUENIL) 200 MG tablet Take 200 mg by mouth 2 (two) times daily.  5  . LORazepam (ATIVAN) 0.5 MG tablet Take 0.5 mg by mouth at bedtime.    . Magnesium 400 MG CAPS Take 1 capsule by mouth at bedtime.    . melatonin 5 MG TABS Take 5 mg by mouth at bedtime.    . meloxicam (MOBIC) 7.5 MG tablet  Take 7.5 mg by mouth in the morning and at bedtime.    Marland Kitchen oxybutynin (DITROPAN) 5 MG tablet Take 5 mg by mouth daily.    . pantoprazole (PROTONIX) 40 MG tablet Take 40 mg by mouth daily.    . polyethylene glycol (MIRALAX / GLYCOLAX) packet 17grams in 6 oz of something to drink twice a day until bowel movement.  LAXITIVE.  Restart if two days since last bowel movement 14 each 0  . PRESCRIPTION MEDICATION Inhale into the lungs at bedtime. CPAP    . sertraline (ZOLOFT) 100 MG tablet Take 150 mg by mouth daily.    . traMADol (ULTRAM) 50 MG tablet Take 50 mg by mouth every 6 (six)  hours as needed for moderate pain (hip / back pain).    . TURMERIC PO Take 1 tablet by mouth at bedtime.    . valsartan-hydrochlorothiazide (DIOVAN-HCT) 160-25 MG tablet Take 1 tablet by mouth daily.     No current facility-administered medications for this visit.    Allergies:   Lisinopril, Beta adrenergic blockers, Lactose intolerance (gi), Penicillins, and Norvasc [amlodipine]   Social History:  The patient  reports that she has never smoked. She has never used smokeless tobacco. She reports that she does not drink alcohol and does not use drugs.   Family History:  The patient's  family history includes Heart attack in her father; Hypertension in her daughter, father, mother, sister, and son; Pulmonary disease in her father; Thyroid disease in her mother and sister.    ROS:  Please see the history of present illness.   All other systems are personally reviewed and negative.    Exam:    Vital Signs:  There were no vitals taken for this visit.   Well sounding, alert and conversant    Labs/Other Tests and Data Reviewed:    Recent Labs: No results found for requested labs within last 8760 hours.   Wt Readings from Last 3 Encounters:  04/12/20 146 lb 12.8 oz (66.6 kg)  08/01/19 150 lb (68 kg)  09/02/18 148 lb (67.1 kg)     Other studies personally reviewed: Additional studies/ records that were reviewed today include: echo from 2018., prior ekgs, office notes  Review of the above records today demonstrates: as above   ASSESSMENT & PLAN:    1.  Bradycardia The patient has symptomatic bradycardia. She has chronic sinus bradycardia with symptoms of weakness and fatigue.  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  Recent event monitor has documented second degree AV block.  I would therefore recommend pacemaker implantation at this time.  Risks, benefits, alternatives to pacemaker implantation were discussed in detail with the patient  today. The patient understands that the risks include but are not limited to bleeding, infection, pneumothorax, perforation, tamponade, vascular damage, renal failure, MI, stroke, death,  and lead dislodgement and wishes to proceed. We will therefore schedule the procedure at the next available time.  We also discussed remote monitoring and its role today. Update echo to evaluate EF prior to PPM implant I will also obtain results from her monitor to further evaluate her second degree AV block events.  2. HTN Stable No change required today  3. OSA Compliance with therapy is advised    Patient Risk:  after full review of this patients clinical status, I feel that they are at moderate risk at this time.   Today, I have spent 15 minutes with the patient with  telehealth technology discussing bradycardia. Army Fossa MD, [place] 05/19/2020 4:11 PM   [place] [address] [phone] (office) [phone] (fax)

## 2020-05-20 ENCOUNTER — Telehealth: Payer: Self-pay | Admitting: *Deleted

## 2020-05-20 DIAGNOSIS — R001 Bradycardia, unspecified: Secondary | ICD-10-CM

## 2020-05-20 NOTE — Telephone Encounter (Signed)
Called patient and left message with Echo scheduled date and time.   Will call again to confirm patient aware

## 2020-05-20 NOTE — Telephone Encounter (Signed)
Spoke to patient and confirmed appt date, time, and address.

## 2020-05-20 NOTE — Telephone Encounter (Signed)
Orders placed for Echo  Working with Lattie Haw to get schedule as soon a possible

## 2020-05-20 NOTE — Telephone Encounter (Signed)
-----   Message from Thompson Grayer, MD sent at 05/19/2020  4:07 PM EDT ----- Needs echo asap.   Could be done in [place], [facility] office, or [address] office (which ever is first)  Once we have result, schedule for pacemaker implant with me

## 2020-05-27 ENCOUNTER — Telehealth: Payer: Self-pay | Admitting: *Deleted

## 2020-05-27 ENCOUNTER — Encounter: Payer: Self-pay | Admitting: *Deleted

## 2020-05-27 DIAGNOSIS — R001 Bradycardia, unspecified: Secondary | ICD-10-CM

## 2020-05-27 NOTE — Telephone Encounter (Signed)
PPM scheduled  Workup completed

## 2020-05-31 ENCOUNTER — Ambulatory Visit (HOSPITAL_COMMUNITY): Payer: Medicare Other | Attending: Internal Medicine

## 2020-05-31 ENCOUNTER — Other Ambulatory Visit: Payer: Self-pay

## 2020-05-31 DIAGNOSIS — R001 Bradycardia, unspecified: Secondary | ICD-10-CM | POA: Insufficient documentation

## 2020-05-31 LAB — ECHOCARDIOGRAM COMPLETE
Area-P 1/2: 4.06 cm2
S' Lateral: 2.9 cm

## 2020-06-02 DIAGNOSIS — I10 Essential (primary) hypertension: Secondary | ICD-10-CM | POA: Diagnosis not present

## 2020-06-02 DIAGNOSIS — E7800 Pure hypercholesterolemia, unspecified: Secondary | ICD-10-CM | POA: Diagnosis not present

## 2020-06-03 ENCOUNTER — Telehealth: Payer: Self-pay

## 2020-06-03 NOTE — Telephone Encounter (Signed)
Reviewed echo results with patient who verbalized understanding.    She requests a call back from [place] next week with the following information from Dr. Rayann Heman: 1) the finalized monitor report 2) she has been taking hydroxychloroquine for years and held it the last week she wore her monitor per another MD. She'd like to know if there was a difference in rhythm the last week she wore the monitor

## 2020-06-03 NOTE — Telephone Encounter (Signed)
-----   Message from Thompson Grayer, MD sent at 06/01/2020  4:12 PM EDT ----- Results reviewed.  Otila Kluver, please inform pt of result.

## 2020-06-08 DIAGNOSIS — Z1231 Encounter for screening mammogram for malignant neoplasm of breast: Secondary | ICD-10-CM | POA: Diagnosis not present

## 2020-06-08 DIAGNOSIS — D251 Intramural leiomyoma of uterus: Secondary | ICD-10-CM | POA: Diagnosis not present

## 2020-06-08 DIAGNOSIS — N83202 Unspecified ovarian cyst, left side: Secondary | ICD-10-CM | POA: Diagnosis not present

## 2020-06-15 DIAGNOSIS — I10 Essential (primary) hypertension: Secondary | ICD-10-CM | POA: Diagnosis not present

## 2020-06-22 ENCOUNTER — Ambulatory Visit: Payer: Medicare Other | Admitting: Cardiology

## 2020-06-22 ENCOUNTER — Other Ambulatory Visit (HOSPITAL_COMMUNITY)
Admission: RE | Admit: 2020-06-22 | Discharge: 2020-06-22 | Disposition: A | Discharge status: Home / Self Care | Payer: Medicare Other | Source: Ambulatory Visit | Attending: Internal Medicine | Admitting: Internal Medicine

## 2020-06-22 ENCOUNTER — Other Ambulatory Visit: Payer: Self-pay

## 2020-06-22 ENCOUNTER — Other Ambulatory Visit: Payer: Medicare Other | Admitting: *Deleted

## 2020-06-22 DIAGNOSIS — Z01812 Encounter for preprocedural laboratory examination: Secondary | ICD-10-CM | POA: Insufficient documentation

## 2020-06-22 DIAGNOSIS — Z20822 Contact with and (suspected) exposure to covid-19: Secondary | ICD-10-CM | POA: Diagnosis not present

## 2020-06-22 DIAGNOSIS — R001 Bradycardia, unspecified: Secondary | ICD-10-CM

## 2020-06-22 LAB — CBC WITH DIFFERENTIAL/PLATELET
Basophils Absolute: 0 10*3/uL (ref 0.0–0.2)
Basos: 1 %
EOS (ABSOLUTE): 0.2 10*3/uL (ref 0.0–0.4)
Eos: 5 %
Hematocrit: 33.4 % — ABNORMAL LOW (ref 34.0–46.6)
Hemoglobin: 11.1 g/dL (ref 11.1–15.9)
Immature Grans (Abs): 0 10*3/uL (ref 0.0–0.1)
Immature Granulocytes: 0 %
Lymphocytes Absolute: 1.1 10*3/uL (ref 0.7–3.1)
Lymphs: 33 %
MCH: 27.4 pg (ref 26.6–33.0)
MCHC: 33.2 g/dL (ref 31.5–35.7)
MCV: 83 fL (ref 79–97)
Monocytes Absolute: 0.5 10*3/uL (ref 0.1–0.9)
Monocytes: 13 %
Neutrophils Absolute: 1.6 10*3/uL (ref 1.4–7.0)
Neutrophils: 48 %
Platelets: 241 10*3/uL (ref 150–450)
RBC: 4.05 x10E6/uL (ref 3.77–5.28)
RDW: 12.7 % (ref 11.7–15.4)
WBC: 3.4 10*3/uL (ref 3.4–10.8)

## 2020-06-22 LAB — SARS CORONAVIRUS 2 (TAT 6-24 HRS): SARS Coronavirus 2: NEGATIVE

## 2020-06-22 LAB — BASIC METABOLIC PANEL
BUN/Creatinine Ratio: 16 (ref 12–28)
BUN: 18 mg/dL (ref 8–27)
CO2: 25 mmol/L (ref 20–29)
Calcium: 9.1 mg/dL (ref 8.7–10.3)
Chloride: 97 mmol/L (ref 96–106)
Creatinine, Ser: 1.15 mg/dL — ABNORMAL HIGH (ref 0.57–1.00)
GFR calc Af Amer: 55 mL/min/{1.73_m2} — ABNORMAL LOW (ref >59)
GFR calc non Af Amer: 47 mL/min/{1.73_m2} — ABNORMAL LOW (ref >59)
Glucose: 84 mg/dL (ref 65–99)
Potassium: 4.1 mmol/L (ref 3.5–5.2)
Sodium: 135 mmol/L (ref 134–144)

## 2020-06-23 NOTE — Progress Notes (Signed)
Instructed patient on the following items: Arrival time 1030  Nothing to eat or drink after midnight No meds AM of procedure Responsible person to drive you home and stay with you for 24 hrs Wash with special soap night before and morning of procedure 

## 2020-06-24 ENCOUNTER — Ambulatory Visit (HOSPITAL_COMMUNITY): Payer: Medicare Other

## 2020-06-24 ENCOUNTER — Encounter (HOSPITAL_COMMUNITY)
Admission: RE | Disposition: A | Discharge status: Home / Self Care | Payer: Self-pay | Source: Home / Self Care | Attending: Internal Medicine

## 2020-06-24 ENCOUNTER — Other Ambulatory Visit: Payer: Self-pay

## 2020-06-24 ENCOUNTER — Ambulatory Visit (HOSPITAL_COMMUNITY)
Admission: RE | Admit: 2020-06-24 | Discharge: 2020-06-24 | Disposition: A | Discharge status: Home / Self Care | Payer: Medicare Other | Attending: Internal Medicine | Admitting: Internal Medicine

## 2020-06-24 DIAGNOSIS — Z8249 Family history of ischemic heart disease and other diseases of the circulatory system: Secondary | ICD-10-CM | POA: Diagnosis not present

## 2020-06-24 DIAGNOSIS — F329 Major depressive disorder, single episode, unspecified: Secondary | ICD-10-CM | POA: Diagnosis not present

## 2020-06-24 DIAGNOSIS — K589 Irritable bowel syndrome without diarrhea: Secondary | ICD-10-CM | POA: Insufficient documentation

## 2020-06-24 DIAGNOSIS — M797 Fibromyalgia: Secondary | ICD-10-CM | POA: Diagnosis not present

## 2020-06-24 DIAGNOSIS — Z791 Long term (current) use of non-steroidal anti-inflammatories (NSAID): Secondary | ICD-10-CM | POA: Insufficient documentation

## 2020-06-24 DIAGNOSIS — Z888 Allergy status to other drugs, medicaments and biological substances status: Secondary | ICD-10-CM | POA: Insufficient documentation

## 2020-06-24 DIAGNOSIS — G4733 Obstructive sleep apnea (adult) (pediatric): Secondary | ICD-10-CM | POA: Diagnosis not present

## 2020-06-24 DIAGNOSIS — I441 Atrioventricular block, second degree: Secondary | ICD-10-CM | POA: Insufficient documentation

## 2020-06-24 DIAGNOSIS — I447 Left bundle-branch block, unspecified: Secondary | ICD-10-CM | POA: Diagnosis not present

## 2020-06-24 DIAGNOSIS — Z880 Allergy status to penicillin: Secondary | ICD-10-CM | POA: Diagnosis not present

## 2020-06-24 DIAGNOSIS — Z959 Presence of cardiac and vascular implant and graft, unspecified: Secondary | ICD-10-CM

## 2020-06-24 DIAGNOSIS — M810 Age-related osteoporosis without current pathological fracture: Secondary | ICD-10-CM | POA: Insufficient documentation

## 2020-06-24 DIAGNOSIS — M4184 Other forms of scoliosis, thoracic region: Secondary | ICD-10-CM | POA: Diagnosis not present

## 2020-06-24 DIAGNOSIS — Z79899 Other long term (current) drug therapy: Secondary | ICD-10-CM | POA: Insufficient documentation

## 2020-06-24 DIAGNOSIS — K219 Gastro-esophageal reflux disease without esophagitis: Secondary | ICD-10-CM | POA: Insufficient documentation

## 2020-06-24 DIAGNOSIS — Z7982 Long term (current) use of aspirin: Secondary | ICD-10-CM | POA: Diagnosis not present

## 2020-06-24 DIAGNOSIS — I10 Essential (primary) hypertension: Secondary | ICD-10-CM | POA: Diagnosis not present

## 2020-06-24 DIAGNOSIS — Z9049 Acquired absence of other specified parts of digestive tract: Secondary | ICD-10-CM | POA: Diagnosis not present

## 2020-06-24 DIAGNOSIS — Z96651 Presence of right artificial knee joint: Secondary | ICD-10-CM | POA: Insufficient documentation

## 2020-06-24 DIAGNOSIS — Z950 Presence of cardiac pacemaker: Secondary | ICD-10-CM | POA: Diagnosis not present

## 2020-06-24 HISTORY — PX: PACEMAKER IMPLANT: EP1218

## 2020-06-24 SURGERY — PACEMAKER IMPLANT

## 2020-06-24 MED ORDER — HEPARIN (PORCINE) IN NACL 1000-0.9 UT/500ML-% IV SOLN
INTRAVENOUS | Status: AC
  Filled 2020-06-24: qty 500

## 2020-06-24 MED ORDER — SODIUM CHLORIDE 0.9% FLUSH: 3.0000 mL | INTRAVENOUS | Status: DC | PRN

## 2020-06-24 MED ORDER — LIDOCAINE HCL (PF) 1 % IJ SOLN
INTRAMUSCULAR | Status: DC | PRN
  Administered 2020-06-24: 45 mL

## 2020-06-24 MED ORDER — VANCOMYCIN HCL IN DEXTROSE 1-5 GM/200ML-% IV SOLN
INTRAVENOUS | Status: AC
  Filled 2020-06-24: qty 200

## 2020-06-24 MED ORDER — SODIUM CHLORIDE 0.9 % IV SOLN: INTRAVENOUS | Status: DC

## 2020-06-24 MED ORDER — ONDANSETRON HCL 4 MG/2ML IJ SOLN: 4.0000 mg | Freq: Four times a day (QID) | INTRAMUSCULAR | Status: DC | PRN

## 2020-06-24 MED ORDER — SODIUM CHLORIDE 0.9 % IV SOLN: 80.0000 mg | INTRAVENOUS | Status: DC

## 2020-06-24 MED ORDER — ACETAMINOPHEN 325 MG PO TABS
325.0000 mg | ORAL_TABLET | ORAL | Status: DC | PRN
  Administered 2020-06-24: 650 mg via ORAL
  Filled 2020-06-24: qty 2

## 2020-06-24 MED ORDER — VANCOMYCIN HCL IN DEXTROSE 1-5 GM/200ML-% IV SOLN
1000.0000 mg | INTRAVENOUS | Status: AC
  Administered 2020-06-24: 1000 mg via INTRAVENOUS

## 2020-06-24 MED ORDER — CHLORHEXIDINE GLUCONATE 4 % EX LIQD
4.0000 "application " | Freq: Once | CUTANEOUS | Status: DC
  Filled 2020-06-24: qty 60

## 2020-06-24 MED ORDER — IOHEXOL 350 MG/ML SOLN
INTRAVENOUS | Status: DC | PRN
  Administered 2020-06-24: 15 mL

## 2020-06-24 MED ORDER — HYDROCODONE-ACETAMINOPHEN 5-325 MG PO TABS: 1.0000 | ORAL_TABLET | ORAL | Status: DC | PRN

## 2020-06-24 MED ORDER — ACETAMINOPHEN 325 MG PO TABS
ORAL_TABLET | ORAL | Status: AC
  Filled 2020-06-24: qty 2

## 2020-06-24 MED ORDER — MIDAZOLAM HCL 5 MG/5ML IJ SOLN
INTRAMUSCULAR | Status: DC | PRN
  Administered 2020-06-24 (×2): 1 mg via INTRAVENOUS

## 2020-06-24 MED ORDER — HEPARIN (PORCINE) IN NACL 1000-0.9 UT/500ML-% IV SOLN
INTRAVENOUS | Status: DC | PRN
  Administered 2020-06-24: 500 mL

## 2020-06-24 MED ORDER — LIDOCAINE HCL 1 % IJ SOLN
INTRAMUSCULAR | Status: AC
  Filled 2020-06-24: qty 60

## 2020-06-24 MED ORDER — SODIUM CHLORIDE 0.9% FLUSH: 3.0000 mL | Freq: Two times a day (BID) | INTRAVENOUS | Status: DC

## 2020-06-24 MED ORDER — CEFAZOLIN SODIUM-DEXTROSE 2-4 GM/100ML-% IV SOLN
INTRAVENOUS | Status: AC
  Filled 2020-06-24: qty 100

## 2020-06-24 MED ORDER — SODIUM CHLORIDE 0.9 % IV SOLN
INTRAVENOUS | Status: AC
  Filled 2020-06-24: qty 2

## 2020-06-24 MED ORDER — MIDAZOLAM HCL 5 MG/5ML IJ SOLN
INTRAMUSCULAR | Status: AC
  Filled 2020-06-24: qty 5

## 2020-06-24 MED ORDER — SODIUM CHLORIDE 0.9 % IV SOLN: 250.0000 mL | INTRAVENOUS | Status: DC | PRN

## 2020-06-24 SURGICAL SUPPLY — 12 items
CABLE SURGICAL S-101-97-12 (CABLE) ×3 IMPLANT
CATH RIGHTSITE C315HIS02 (CATHETERS) ×2 IMPLANT
IPG PACE AZUR XT DR MRI W1DR01 (Pacemaker) IMPLANT
LEAD CAPSURE NOVUS 5076-52CM (Lead) ×2 IMPLANT
LEAD SELECT SECURE 3830 383069 (Lead) IMPLANT
PACE AZURE XT DR MRI W1DR01 (Pacemaker) ×3 IMPLANT
PAD PRO RADIOLUCENT 2001M-C (PAD) ×3 IMPLANT
SELECT SECURE 3830 383069 (Lead) ×3 IMPLANT
SHEATH 7FR PRELUDE SNAP 13 (SHEATH) ×4 IMPLANT
SLITTER 6232ADJ (MISCELLANEOUS) ×2 IMPLANT
TRAY PACEMAKER INSERTION (PACKS) ×3 IMPLANT
WIRE HI TORQ VERSACORE-J 145CM (WIRE) ×2 IMPLANT

## 2020-06-24 NOTE — Discharge Instructions (Signed)
Supplemental Discharge Instructions for  Pacemaker/Defibrillator Patients  Activity Do not raise your left/right arm above shoulder level or extend it backward beyond shoulder level for 2 weeks. Wear the arm sling as a reminder or as needed for comfort for 2 weeks. No heavy lifting or vigorous activity with your left/right arm for 6-8 weeks.    NO DRIVING is preferable for 2 weeks; If absolutely necessary, drive only short, familiar routes. DO wear your seatbelt, even if it crosses over the pacemaker site.  WOUND CARE - Keep the wound area clean and dry.  Remove the dressing the day after you return home (usually 48 hours after the procedure). - DO NOT SUBMERGE UNDER WATER UNTIL FULLY HEALED (no tub baths, hot tubs, swimming pools, etc.).  - You  may shower or take a sponge bath after the dressing is removed. DO NOT SOAK the area and do not allow the shower to directly spray on the site. - If you have staples, these will be removed in the office in 7-14 days. - If you have tape/steri-strips on your wound, these will fall off; do not pull them off prematurely.   - No bandage is needed on the site.  DO  NOT apply any creams, oils, or ointments to the wound area. - If you notice any drainage or discharge from the wound, any swelling, excessive redness or bruising at the site, or if you develop a fever > 101? F after you are discharged home, call the office at once.  Special Instructions - You are still able to use cellular telephones.  Avoid carrying your cellular phone near your device. - When traveling through airports, show security personnel your identification card to avoid being screened in the metal detectors.  - Avoid arc welding equipment, MRI testing (magnetic resonance imaging), TENS units (transcutaneous nerve stimulators).  Call the office for questions about other devices. - Avoid electrical appliances that are in poor condition or are not properly grounded. - Microwave ovens are  safe to be near or to operate.  Additional information for defibrillator patients should your device go off: - If your device goes off ONCE and you feel fine afterward, notify the clinic at [phone]. - If your device goes off ONCE and you do not feel well afterward, call 911. - If your device goes off TWICE or more in one day, call 911.  DO NOT DRIVE YOURSELF OR A FAMILY MEMBER WITH A DEFIBRILLATOR TO THE HOSPITAL--CALL 911.Supplemental Discharge Instructions for  Pacemaker/Defibrillator Patients  Activity Do not raise your left/right arm above shoulder level or extend it backward beyond shoulder level for 2 weeks. Wear the arm sling as a reminder or as needed for comfort for 2 weeks. No heavy lifting or vigorous activity with your left/right arm for 6-8 weeks.    NO DRIVING is preferable for 2 weeks; If absolutely necessary, drive only short, familiar routes. DO wear your seatbelt, even if it crosses over the pacemaker site.  WOUND CARE - Keep the wound area clean and dry.  Remove the dressing the day after you return home (usually 48 hours after the procedure). - DO NOT SUBMERGE UNDER WATER UNTIL FULLY HEALED (no tub baths, hot tubs, swimming pools, etc.).  - You  may shower or take a sponge bath after the dressing is removed. DO NOT SOAK the area and do not allow the shower to directly spray on the site. - If you have staples, these will be removed in the office in 7-14  days. - If you have tape/steri-strips on your wound, these will fall off; do not pull them off prematurely.   - No bandage is needed on the site.  DO  NOT apply any creams, oils, or ointments to the wound area. - If you notice any drainage or discharge from the wound, any swelling, excessive redness or bruising at the site, or if you develop a fever > 101? F after you are discharged home, call the office at once.  Special Instructions - You are still able to use cellular telephones.  Avoid carrying your cellular  phone near your device. - When traveling through airports, show security personnel your identification card to avoid being screened in the metal detectors.  - Avoid arc welding equipment, MRI testing (magnetic resonance imaging), TENS units (transcutaneous nerve stimulators).  Call the office for questions about other devices. - Avoid electrical appliances that are in poor condition or are not properly grounded. - Microwave ovens are safe to be near or to operate.  Additional information for defibrillator patients should your device go off: - If your device goes off ONCE and you feel fine afterward, notify the clinic at [phone]. - If your device goes off ONCE and you do not feel well afterward, call 911. - If your device goes off TWICE or more in one day, call 911.  DO NOT DRIVE YOURSELF OR A FAMILY MEMBER WITH A DEFIBRILLATOR TO THE HOSPITAL--CALL 911.

## 2020-06-24 NOTE — H&P (Signed)
Chief Complaint:  AV block  History of Present Illness:    Susan Hicks is a 74 y.o. female who presents today for pacemaker implantation.   The patient has a h/o sinus bradycardia with heart rates 40s and symptoms of fatigue and weakness.  She has recently worn a monitor (not currently available) which has also documented second degree AV Block.  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   Today, she denies symptoms of palpitations, chest pain, shortness of breath, orthopnea, PND, lower extremity edema, claudication, dizziness, presyncope, syncope, bleeding, or neurologic sequela. The patient is tolerating medications without difficulties and is otherwise without complaint today.         Past Medical History:  Diagnosis Date  . Depression   . Fibromyalgia    now considered mixed connective tissue disorder  . Generalized anxiety disorder 08/21/2018  . GERD (gastroesophageal reflux disease)   . Hypertension   . Irritable bowel   . Junctional bradycardia    a. 2017 - beta blocker discontinued.  Marland Kitchen LBBB (left bundle branch block)   . Mixed connective tissue disease ([place])   . Obstructive sleep apnea treated with continuous positive airway pressure (CPAP) 08/21/2018  . Osteoporosis   . Premature atrial contractions   . Primary localized osteoarthritis of right knee          Past Surgical History:  Procedure Laterality Date  . BACK SURGERY    . CHOLECYSTECTOMY    . HAND SURGERY  2006  . TOTAL KNEE ARTHROPLASTY Right 09/02/2018   Procedure: TOTAL KNEE ARTHROPLASTY;  Surgeon: Elsie Saas, MD;  Location: [place];  Service: Orthopedics;  Laterality: Right;          Current Outpatient Medications  Medication Sig Dispense Refill  . acetaminophen (TYLENOL) 500 MG tablet Take 500 mg by mouth at bedtime.    Marland Kitchen aspirin EC 81 MG tablet Take 81 mg by mouth daily.    . busPIRone (BUSPAR) 15 MG tablet Take 15 mg by mouth 2 (two) times daily.     . carboxymethylcellulose (REFRESH PLUS) 0.5 % SOLN Place 1 drop into both eyes 3 (three) times daily.    . cetirizine (ZYRTEC) 10 MG tablet Take 10 mg by mouth daily.    . Cholecalciferol (VITAMIN D3) 2000 units TABS Take 2,000 Units by mouth daily.     . diphenhydramine-acetaminophen (TYLENOL PM) 25-500 MG TABS tablet Take 1 tablet by mouth at bedtime as needed.    . hydroxychloroquine (PLAQUENIL) 200 MG tablet Take 200 mg by mouth 2 (two) times daily.  5  . LORazepam (ATIVAN) 0.5 MG tablet Take 0.5 mg by mouth at bedtime.    . Magnesium 400 MG CAPS Take 1 capsule by mouth at bedtime.    . melatonin 5 MG TABS Take 5 mg by mouth at bedtime.    . meloxicam (MOBIC) 7.5 MG tablet Take 7.5 mg by mouth in the morning and at bedtime.    Marland Kitchen oxybutynin (DITROPAN) 5 MG tablet Take 5 mg by mouth daily.    . pantoprazole (PROTONIX) 40 MG tablet Take 40 mg by mouth daily.    . polyethylene glycol (MIRALAX / GLYCOLAX) packet 17grams in 6 oz of something to drink twice a day until bowel movement.  LAXITIVE.  Restart if two days since last bowel movement 14 each 0  . PRESCRIPTION MEDICATION Inhale into the lungs at bedtime. CPAP    . sertraline (ZOLOFT) 100 MG tablet Take 150 mg  by mouth daily.    . traMADol (ULTRAM) 50 MG tablet Take 50 mg by mouth every 6 (six) hours as needed for moderate pain (hip / back pain).    . TURMERIC PO Take 1 tablet by mouth at bedtime.    . valsartan-hydrochlorothiazide (DIOVAN-HCT) 160-25 MG tablet Take 1 tablet by mouth daily.     No current facility-administered medications for this visit.    Allergies:   Lisinopril, Beta adrenergic blockers, Lactose intolerance (gi), Penicillins, and Norvasc [amlodipine]   Social History:  The patient  reports that she has never smoked. She has never used smokeless tobacco. She reports that she does not drink alcohol and does not use drugs.   Family History:  The patient's  family history includes  Heart attack in her father; Hypertension in her daughter, father, mother, sister, and son; Pulmonary disease in her father; Thyroid disease in her mother and sister.    ROS:  Please see the history of present illness.   All other systems are personally reviewed and negative.   Physical Exam: Vitals:   06/24/20 1030  BP: (!) 173/86  Pulse: 73  Resp: 18  Temp: 97.9 F (36.6 C)  TempSrc: Oral  SpO2: 98%  Weight: 65.8 kg  Height: 5\' 3"  (1.6 m)    GEN- The patient is well appearing, alert and oriented x 3 today.   Head- normocephalic, atraumatic Eyes-  Sclera clear, conjunctiva pink Ears- hearing intact Oropharynx- clear Neck- supple, Lungs-  normal work of breathing Heart- Regular rate and rhythm  GI- soft  Extremities- no clubbing, cyanosis, or edema  MS- no significant deformity or atrophy Skin- no rash or lesion Psych- euthymic mood, full affect Neuro- strength and sensation are intact   Labs/Other Tests and Data Reviewed:    Recent Labs: No results found for requested labs within last 8760 hours.      Wt Readings from Last 3 Encounters:  04/12/20 146 lb 12.8 oz (66.6 kg)  08/01/19 150 lb (68 kg)  09/02/18 148 lb (67.1 kg)     Other studies personally reviewed: Additional studies/ records that were reviewed today include: echo from 2018., prior ekgs, office notes  Review of the above records today demonstrates: as above   ASSESSMENT & PLAN:    1.  Bradycardia The patient has symptomatic bradycardia. She has chronic sinus bradycardia with symptoms of weakness and fatigue.  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  Recent event monitor has documented second degree AV block with 2:1 AV conduction (personally reviewed).  I would therefore recommend pacemaker implantation at this time.   Risks, benefits, alternatives to pacemaker implantation were discussed in detail with the patient today. The patient understands  that the risks include but are not limited to bleeding, infection, pneumothorax, perforation, tamponade, vascular damage, renal failure, MI, stroke, death,  and lead dislodgement and wishes to proceed.  Thompson Grayer MD, [place] 06/24/2020 10:52 AM

## 2020-06-25 ENCOUNTER — Encounter (HOSPITAL_COMMUNITY): Payer: Self-pay | Admitting: Internal Medicine

## 2020-06-25 ENCOUNTER — Telehealth: Payer: Self-pay | Admitting: *Deleted

## 2020-06-25 MED FILL — Gentamicin Sulfate Inj 40 MG/ML: INTRAMUSCULAR | Qty: 80 | Status: AC

## 2020-06-25 MED FILL — Cefazolin Sodium-Dextrose IV Solution 2 GM/100ML-4%: INTRAVENOUS | Qty: 100 | Status: AC

## 2020-06-25 MED FILL — Lidocaine HCl Local Inj 1%: INTRAMUSCULAR | Qty: 60 | Status: AC

## 2020-06-25 NOTE — Telephone Encounter (Addendum)
Follow-up after same day discharge: Implant date: 06/24/2020 MD: Thompson Grayer, MD Device: Medtronic PPM Location: Left chest  Wound check visit: 07/08/20 at 3:00pm 91 day MD follow-up: Needs to be scheduled, message sent to scheduler.  Remote Transmission received: Yes  Dressing removed: Not yet, pt is planning to remove it this afternoon.  Reviewed discharge and activity progression with pt. Advised to remove sling and occlusive dressing this afternoon. Advised to call for any signs/symptoms of infection or bleeding. Pt verbalizes understanding and denies additional questions at this time.

## 2020-07-08 ENCOUNTER — Other Ambulatory Visit: Payer: Self-pay

## 2020-07-08 ENCOUNTER — Ambulatory Visit (INDEPENDENT_AMBULATORY_CARE_PROVIDER_SITE_OTHER): Payer: Medicare Other | Admitting: Emergency Medicine

## 2020-07-08 DIAGNOSIS — I441 Atrioventricular block, second degree: Secondary | ICD-10-CM | POA: Diagnosis not present

## 2020-07-08 LAB — CUP PACEART INCLINIC DEVICE CHECK
Battery Remaining Longevity: 142 mo
Battery Voltage: 3.21 V
Brady Statistic AP VP Percent: 2.29 %
Brady Statistic AP VS Percent: 0.23 %
Brady Statistic AS VP Percent: 96.18 %
Brady Statistic AS VS Percent: 1.31 %
Brady Statistic RA Percent Paced: 2.51 %
Brady Statistic RV Percent Paced: 98.47 %
Date Time Interrogation Session: 20210923155836
Implantable Lead Implant Date: 20210909
Implantable Lead Implant Date: 20210909
Implantable Lead Location: 753859
Implantable Lead Location: 753860
Implantable Lead Model: 3830
Implantable Lead Model: 5076
Implantable Pulse Generator Implant Date: 20210909
Lead Channel Impedance Value: 323 Ohm
Lead Channel Impedance Value: 437 Ohm
Lead Channel Impedance Value: 475 Ohm
Lead Channel Impedance Value: 589 Ohm
Lead Channel Pacing Threshold Amplitude: 0.5 V
Lead Channel Pacing Threshold Amplitude: 0.75 V
Lead Channel Pacing Threshold Pulse Width: 0.4 ms
Lead Channel Pacing Threshold Pulse Width: 0.4 ms
Lead Channel Sensing Intrinsic Amplitude: 1.125 mV
Lead Channel Sensing Intrinsic Amplitude: 8.75 mV
Lead Channel Setting Pacing Amplitude: 3.5 V
Lead Channel Setting Pacing Amplitude: 3.5 V
Lead Channel Setting Pacing Pulse Width: 0.4 ms
Lead Channel Setting Sensing Sensitivity: 0.9 mV

## 2020-07-08 NOTE — Progress Notes (Signed)
Wound check appointment. Steri-strips removed. Wound without redness or edema. Incision edges approximated, wound well healed. Normal device function. Thresholds, sensing, and impedances consistent with implant measurements. Device programmed at 3.5V for extra safety margin until 3 month visit. Histogram distribution appropriate for patient and level of activity. No mode switches or high ventricular rates noted. Patient educated about wound care, arm mobility, lifting restrictions.  Patient is enrolled in remote monitoring, next check 09/23/20.  ROV with Dr. Allred/E on 10/22/20.

## 2020-07-15 DIAGNOSIS — E7800 Pure hypercholesterolemia, unspecified: Secondary | ICD-10-CM | POA: Diagnosis not present

## 2020-07-15 DIAGNOSIS — I10 Essential (primary) hypertension: Secondary | ICD-10-CM | POA: Diagnosis not present

## 2020-07-18 ENCOUNTER — Encounter (HOSPITAL_COMMUNITY): Payer: Self-pay

## 2020-07-18 ENCOUNTER — Emergency Department (HOSPITAL_COMMUNITY)
Admission: EM | Admit: 2020-07-18 | Discharge: 2020-07-19 | Disposition: A | Discharge status: Home / Self Care | Payer: Medicare Other | Attending: Emergency Medicine | Admitting: Emergency Medicine

## 2020-07-18 ENCOUNTER — Emergency Department (HOSPITAL_COMMUNITY): Payer: Medicare Other

## 2020-07-18 DIAGNOSIS — Z950 Presence of cardiac pacemaker: Secondary | ICD-10-CM | POA: Insufficient documentation

## 2020-07-18 DIAGNOSIS — R0902 Hypoxemia: Secondary | ICD-10-CM | POA: Diagnosis not present

## 2020-07-18 DIAGNOSIS — Z96652 Presence of left artificial knee joint: Secondary | ICD-10-CM | POA: Insufficient documentation

## 2020-07-18 DIAGNOSIS — M19012 Primary osteoarthritis, left shoulder: Secondary | ICD-10-CM | POA: Diagnosis not present

## 2020-07-18 DIAGNOSIS — I10 Essential (primary) hypertension: Secondary | ICD-10-CM | POA: Insufficient documentation

## 2020-07-18 DIAGNOSIS — R6884 Jaw pain: Secondary | ICD-10-CM

## 2020-07-18 DIAGNOSIS — R0689 Other abnormalities of breathing: Secondary | ICD-10-CM | POA: Diagnosis not present

## 2020-07-18 DIAGNOSIS — R52 Pain, unspecified: Secondary | ICD-10-CM | POA: Diagnosis not present

## 2020-07-18 DIAGNOSIS — Z79899 Other long term (current) drug therapy: Secondary | ICD-10-CM | POA: Insufficient documentation

## 2020-07-18 DIAGNOSIS — M19011 Primary osteoarthritis, right shoulder: Secondary | ICD-10-CM | POA: Diagnosis not present

## 2020-07-18 DIAGNOSIS — R0989 Other specified symptoms and signs involving the circulatory and respiratory systems: Secondary | ICD-10-CM | POA: Diagnosis not present

## 2020-07-18 LAB — CBC
HCT: 35.3 % — ABNORMAL LOW (ref 36.0–46.0)
Hemoglobin: 11.1 g/dL — ABNORMAL LOW (ref 12.0–15.0)
MCH: 26.1 pg (ref 26.0–34.0)
MCHC: 31.4 g/dL (ref 30.0–36.0)
MCV: 83.1 fL (ref 80.0–100.0)
Platelets: 261 10*3/uL (ref 150–400)
RBC: 4.25 MIL/uL (ref 3.87–5.11)
RDW: 12.1 % (ref 11.5–15.5)
WBC: 5 10*3/uL (ref 4.0–10.5)
nRBC: 0 % (ref 0.0–0.2)

## 2020-07-18 LAB — BASIC METABOLIC PANEL
Anion gap: 11 (ref 5–15)
BUN: 21 mg/dL (ref 8–23)
CO2: 25 mmol/L (ref 22–32)
Calcium: 9.4 mg/dL (ref 8.9–10.3)
Chloride: 101 mmol/L (ref 98–111)
Creatinine, Ser: 1.34 mg/dL — ABNORMAL HIGH (ref 0.44–1.00)
GFR calc Af Amer: 45 mL/min — ABNORMAL LOW (ref >60)
GFR calc non Af Amer: 39 mL/min — ABNORMAL LOW (ref >60)
Glucose, Bld: 100 mg/dL — ABNORMAL HIGH (ref 70–99)
Potassium: 4.4 mmol/L (ref 3.5–5.1)
Sodium: 137 mmol/L (ref 135–145)

## 2020-07-18 LAB — TROPONIN I (HIGH SENSITIVITY): Troponin I (High Sensitivity): 7 ng/L (ref <18)

## 2020-07-18 NOTE — ED Notes (Signed)
Patients son n law Justin [phone]

## 2020-07-18 NOTE — ED Triage Notes (Signed)
Patient arrived by [facility] after experiencing jaw pain that started after eating ice cream. No other symptoms. Patient had pacemaker placed on 9/9. Patient reports that pain much improved

## 2020-07-19 ENCOUNTER — Ambulatory Visit: Payer: Medicare Other | Admitting: Family Medicine

## 2020-07-19 DIAGNOSIS — R6884 Jaw pain: Secondary | ICD-10-CM | POA: Diagnosis not present

## 2020-07-19 DIAGNOSIS — I10 Essential (primary) hypertension: Secondary | ICD-10-CM | POA: Diagnosis not present

## 2020-07-19 LAB — TROPONIN I (HIGH SENSITIVITY): Troponin I (High Sensitivity): 16 ng/L (ref <18)

## 2020-07-19 NOTE — Discharge Instructions (Addendum)
The testing today did not show any heart or lung problems.  Your pain which was transient is most likely from a muscle cramp.  Your pacemaker is working appropriately.  If you continue to have additional problems including pain, trouble breathing, weakness, dizziness; see your primary care doctor or cardiologist.

## 2020-07-19 NOTE — ED Provider Notes (Signed)
[facility] EMERGENCY DEPARTMENT Provider Note   CSN: [id number] Arrival date & time: 07/18/20  1807     History Chief Complaint  Patient presents with  . jaw pain/ new pacemaker 9/9    Susan Hicks is a 74 y.o. female.  HPI Patient was at home yesterday when she experienced about 30 minutes of left jaw pain.  She was not active when the pain started, but had eaten some ice cream, prior to that.  During the time when she was having chest pain, she got up to do some things around the house, noticed that the pain continued, but did not have other symptoms.  Pain has not recurred since that resolved yesterday.  She had a prolonged ED waiting room stay before being placed in a examination room.  She denies other problems.  She had a pacemaker placed, 3 weeks ago.  She is not having chest pain, currently.  She did not notice chest pain yesterday.  She denies other recent illnesses including cough, shortness of breath, nausea, vomiting, weakness or dizziness.  She is taking her usual medications as prescribed.  There are no other known modifying factors.    Past Medical History:  Diagnosis Date  . Depression   . Fibromyalgia    now considered mixed connective tissue disorder  . Generalized anxiety disorder 08/21/2018  . GERD (gastroesophageal reflux disease)   . Hypertension   . Irritable bowel   . Junctional bradycardia    a. 2017 - beta blocker discontinued.  Marland Kitchen LBBB (left bundle branch block)   . Mixed connective tissue disease ([place])   . Obstructive sleep apnea treated with continuous positive airway pressure (CPAP) 08/21/2018  . Osteoporosis   . Premature atrial contractions   . Primary localized osteoarthritis of right knee     Patient Active Problem List   Diagnosis Date Noted  . LBBB (left bundle branch block) - intermittent 09/02/2018  . Second degree AV block, Mobitz type I 09/02/2018  . 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  Premature atrial contractions 09/02/2018  . Prolonged Q-T interval on ECG 09/02/2018  . Generalized anxiety disorder 08/21/2018  . Obstructive sleep apnea treated with continuous positive airway pressure (CPAP) 08/21/2018  . Irritable bowel   . Hypertension   . GERD (gastroesophageal reflux disease)   . Fibromyalgia   . Primary localized osteoarthritis of right knee   . Bradycardia 04/13/2016    Past Surgical History:  Procedure Laterality Date  . BACK SURGERY    . CHOLECYSTECTOMY    . HAND SURGERY  2006  . PACEMAKER IMPLANT N/A 06/24/2020   Procedure: PACEMAKER IMPLANT;  Surgeon: Thompson Grayer, MD;  Location: [place] [facility];  Service: Cardiovascular;  Laterality: N/A;  . TOTAL KNEE ARTHROPLASTY Right 09/02/2018   Procedure: TOTAL KNEE ARTHROPLASTY;  Surgeon: Elsie Saas, MD;  Location: [place];  Service: Orthopedics;  Laterality: Right;     OB History   No obstetric history on file.     Family History  Problem Relation Age of Onset  . Heart attack Father   . Pulmonary disease Father   . Hypertension Father   . Thyroid disease Mother   . Hypertension Mother   . Thyroid disease Sister   . Hypertension Sister   . Hypertension Daughter   . Hypertension Son     Social History   Tobacco Use  . Smoking status: Never Smoker  . Smokeless tobacco: Never Used  Vaping Use  .  Vaping Use: Never used  Substance Use Topics  . Alcohol use: No    Alcohol/week: 0.0 standard drinks  . Drug use: No    Home Medications Prior to Admission medications   Medication Sig Start Date End Date Taking? Authorizing Provider  acetaminophen (TYLENOL) 500 MG tablet Take 500 mg by mouth at bedtime as needed for moderate pain.     [provider]  Artificial Tear Solution (SOOTHE XP OP) Place 1 drop into both eyes in the morning and at bedtime.    [provider]  b complex vitamins tablet Take 1 tablet by mouth daily.    [provider]  busPIRone (BUSPAR) 15 MG  tablet Take 15 mg by mouth 2 (two) times daily.    [provider]  Calcium Carb-Cholecalciferol (CALCIUM PLUS VITAMIN D3) 600-500 MG-UNIT CAPS Take 1 tablet by mouth daily.    [provider]  cetirizine (ZYRTEC) 10 MG tablet Take 10 mg by mouth daily.    [provider]  chlorpheniramine (CHLOR-TRIMETON) 4 MG tablet Take 4 mg by mouth at bedtime as needed for allergies.    [provider]  Cholecalciferol (VITAMIN D3) 2000 units TABS Take 2,000 Units by mouth daily.     [provider]  diphenhydramine-acetaminophen (TYLENOL PM) 25-500 MG TABS tablet Take 1 tablet by mouth at bedtime.     [provider]  hydroxychloroquine (PLAQUENIL) 200 MG tablet Take 200 mg by mouth 2 (two) times daily. 08/14/18   [provider]  LORazepam (ATIVAN) 0.5 MG tablet Take 0.5 mg by mouth at bedtime.    [provider]  Magnesium 250 MG TABS Take 250 mg by mouth at bedtime.    [provider]  melatonin 5 MG TABS Take 5 mg by mouth at bedtime.    [provider]  meloxicam (MOBIC) 7.5 MG tablet Take 7.5 mg by mouth in the morning and at bedtime.    [provider]  Multiple Vitamins-Minerals (ADULT GUMMY) CHEW Chew 2 tablets by mouth daily.    [provider]  oxybutynin (DITROPAN) 5 MG tablet Take 5 mg by mouth daily.    [provider]  pantoprazole (PROTONIX) 40 MG tablet Take 40 mg by mouth daily.    [provider]  PRESCRIPTION MEDICATION Inhale into the lungs at bedtime. CPAP    [provider]  Psyllium (METAMUCIL FIBER PO) Take 1 Dose by mouth daily.    [provider]  sertraline (ZOLOFT) 100 MG tablet Take 150 mg by mouth daily.    [provider]  traMADol (ULTRAM) 50 MG tablet Take 50 mg by mouth every 6 (six) hours as needed for moderate pain (hip / back pain).    [provider]  Turmeric Curcumin 500 MG CAPS Take 500 mg by mouth at  bedtime.    [provider]  valsartan-hydrochlorothiazide (DIOVAN-HCT) 160-25 MG tablet Take 1 tablet by mouth daily.    [provider]    Allergies    Lisinopril, Beta adrenergic blockers, Gluten meal, Penicillins, and Norvasc [amlodipine]  Review of Systems   Review of Systems  All other systems reviewed and are negative.   Physical Exam Updated Vital Signs BP (!) 166/84 (BP Location: Right Arm)   Pulse 74   Temp 98.2 F (36.8 C) (Oral)   Resp 14   Ht 5\' 3"  (1.6 m)   Wt 64.4 kg   SpO2 99%   BMI 25.15 kg/m   Physical Exam  Vitals and nursing note reviewed.  Constitutional:      General: She is not in acute distress.    Appearance: She is well-developed. She is not ill-appearing, toxic-appearing or diaphoretic.  HENT:     Head: Normocephalic and atraumatic.     Right Ear: External ear normal.     Left Ear: External ear normal.  Eyes:     Conjunctiva/sclera: Conjunctivae normal.     Pupils: Pupils are equal, round, and reactive to light.  Neck:     Trachea: Phonation normal.  Cardiovascular:     Rate and Rhythm: Normal rate and regular rhythm.     Heart sounds: Normal heart sounds.  Pulmonary:     Effort: Pulmonary effort is normal.     Breath sounds: Normal breath sounds.  Abdominal:     General: There is no distension.     Palpations: Abdomen is soft.     Tenderness: There is no abdominal tenderness.  Musculoskeletal:        General: Normal range of motion.     Cervical back: Normal range of motion and neck supple.  Skin:    General: Skin is warm and dry.  Neurological:     Mental Status: She is alert and oriented to person, place, and time.     Cranial Nerves: No cranial nerve deficit.     Sensory: No sensory deficit.     Motor: No abnormal muscle tone.     Coordination: Coordination normal.  Psychiatric:        Behavior: Behavior normal.        Thought Content: Thought content normal.        Judgment: Judgment normal.     ED  Results / Procedures / Treatments   Labs (all labs ordered are listed, but only abnormal results are displayed) Labs Reviewed  BASIC METABOLIC PANEL - Abnormal; Notable for the following components:      Result Value   Glucose, Bld 100 (*)    Creatinine, Ser 1.34 (*)    GFR calc non Af Amer 39 (*)    GFR calc Af Amer 45 (*)    All other components within normal limits  CBC - Abnormal; Notable for the following components:   Hemoglobin 11.1 (*)    HCT 35.3 (*)    All other components within normal limits  TROPONIN I (HIGH SENSITIVITY)  TROPONIN I (HIGH SENSITIVITY)    EKG EKG Interpretation  Date/Time:  Sunday July 18 2020 18:30:29 EDT Ventricular Rate:  73 PR Interval:  182 QRS Duration: 124 QT Interval:  410 QTC Calculation: 451 R Axis:   -8 Text Interpretation: Atrial-sensed ventricular-paced rhythm Abnormal ECG since last tracing no significant change Confirmed by Daleen Bo [phone]) on 07/19/2020 8:08:21 AM   Radiology DG Chest 2 View  Result Date: 07/18/2020 CLINICAL DATA:  Jaw pain EXAM: CHEST - 2 VIEW COMPARISON:  06/24/2020 FINDINGS: There is a dual chamber left-sided pacemaker in place. The heart size is stable. There is no focal infiltrate. No pneumothorax. No large pleural effusion. There are degenerative changes of both glenohumeral joints. There is no acute osseous abnormality. IMPRESSION: No active cardiopulmonary disease. Electronically Signed   By: Constance Holster M.D.   On: 07/18/2020 19:14    Procedures Procedures (including critical care time)  Medications Ordered in ED Medications - No data to display  ED Course  I have reviewed the triage vital signs and the nursing notes.  Pertinent labs & imaging results that were available  during my care of the patient were reviewed by me and considered in my medical decision making (see chart for details).    MDM Rules/Calculators/A&P                           Patient Vitals for the past 24 hrs:   BP Temp Temp src Pulse Resp SpO2 Height Weight  07/19/20 0832 (!) 166/84 98.2 F (36.8 C) Oral 74 14 99 % -- --  07/19/20 0802 (!) 152/77 98.1 F (36.7 C) Oral 72 12 99 % -- --  07/19/20 0536 (!) 173/78 98 F (36.7 C) Oral 71 16 98 % -- --  07/19/20 0303 (!) 144/70 98.2 F (36.8 C) Oral 69 16 96 % -- --  07/18/20 2357 (!) 160/73 98.4 F (36.9 C) Oral 70 17 100 % -- --  07/18/20 2028 (!) 156/81 98.5 F (36.9 C) Oral 70 17 100 % -- --  07/18/20 1834 (!) 194/106 98.8 F (37.1 C) Oral 76 18 100 % 5\' 3"  (1.6 m) 64.4 kg    8:39 AM Reevaluation with update and discussion. After initial assessment and treatment, an updated evaluation reveals no change in clinical status, plan discussed with patient all questions were answered. Daleen Bo   Medical Decision Making:  This patient is presenting for evaluation of left jaw pain, which does require a range of treatment options, and is a complaint that involves a moderate risk of morbidity and mortality. The differential diagnoses include ACS, intraoral disorder, muscular pain, radiculopathy. I decided to review old records, and in summary elderly female with recent pacemaker placement, and stable cardiac status otherwise presenting with transient left jaw pain, while at rest; noncardiac type of discomfort.  I did not require additional historical information from anyone.  Clinical Laboratory Tests Ordered, included CBC, Metabolic panel and Troponin testing. Review indicates normal. Radiologic Tests Ordered, included chest x-ray.  I independently Visualized: Radiographic images, which show no infiltrate or edema.   Critical Interventions-clinical evaluation, laboratory testing, EKG, chest x-ray, observation reassessment  After These Interventions, the Patient was reevaluated and was found comfortable without recurrence of pain.  EKG shows normal paced rhythm, unchanged from prior.  Laboratory testing and chest x-ray are normal.  Patient with  brief transient pain, without associated symptoms, unlikely to represent a anginal equivalent, radiculopathy or myelopathy.  Suspect muscle cramp.  No indication for further ED evaluations, hospitalizations or other interventions at this time.  CRITICAL CARE-no Performed by: Daleen Bo  Nursing Notes Reviewed/ Care Coordinated Applicable Imaging Reviewed Interpretation of Laboratory Data incorporated into ED treatment  The patient appears reasonably screened and/or stabilized for discharge and I doubt any other medical condition or other [facility] requiring further screening, evaluation, or treatment in the ED at this time prior to discharge.  Plan: Home Medications-continue usual; Home Treatments-regular activity and diet; return here if the recommended treatment, does not improve the symptoms; Recommended follow up-PCP and cardiology follow-up as needed     Final Clinical Impression(s) / ED Diagnoses Final diagnoses:  Jaw pain    Rx / DC Orders ED Discharge Orders    None       Daleen Bo, MD 07/19/20 [phone]

## 2020-07-22 DIAGNOSIS — M545 Low back pain, unspecified: Secondary | ICD-10-CM | POA: Diagnosis not present

## 2020-07-22 DIAGNOSIS — I10 Essential (primary) hypertension: Secondary | ICD-10-CM | POA: Diagnosis not present

## 2020-07-22 DIAGNOSIS — R6884 Jaw pain: Secondary | ICD-10-CM | POA: Diagnosis not present

## 2020-07-22 DIAGNOSIS — M351 Other overlap syndromes: Secondary | ICD-10-CM | POA: Diagnosis not present

## 2020-07-22 DIAGNOSIS — Z299 Encounter for prophylactic measures, unspecified: Secondary | ICD-10-CM | POA: Diagnosis not present

## 2020-08-10 DIAGNOSIS — M5136 Other intervertebral disc degeneration, lumbar region: Secondary | ICD-10-CM | POA: Diagnosis not present

## 2020-08-10 DIAGNOSIS — M47816 Spondylosis without myelopathy or radiculopathy, lumbar region: Secondary | ICD-10-CM | POA: Diagnosis not present

## 2020-08-10 DIAGNOSIS — M545 Low back pain, unspecified: Secondary | ICD-10-CM | POA: Diagnosis not present

## 2020-08-11 DIAGNOSIS — M5126 Other intervertebral disc displacement, lumbar region: Secondary | ICD-10-CM | POA: Diagnosis not present

## 2020-08-12 DIAGNOSIS — M3500 Sicca syndrome, unspecified: Secondary | ICD-10-CM | POA: Diagnosis not present

## 2020-08-12 DIAGNOSIS — M797 Fibromyalgia: Secondary | ICD-10-CM | POA: Diagnosis not present

## 2020-08-12 DIAGNOSIS — M351 Other overlap syndromes: Secondary | ICD-10-CM | POA: Diagnosis not present

## 2020-08-12 DIAGNOSIS — Z6825 Body mass index (BMI) 25.0-25.9, adult: Secondary | ICD-10-CM | POA: Diagnosis not present

## 2020-08-12 DIAGNOSIS — M25551 Pain in right hip: Secondary | ICD-10-CM | POA: Diagnosis not present

## 2020-08-12 DIAGNOSIS — I7300 Raynaud's syndrome without gangrene: Secondary | ICD-10-CM | POA: Diagnosis not present

## 2020-08-12 DIAGNOSIS — E663 Overweight: Secondary | ICD-10-CM | POA: Diagnosis not present

## 2020-08-12 DIAGNOSIS — M2550 Pain in unspecified joint: Secondary | ICD-10-CM | POA: Diagnosis not present

## 2020-08-12 DIAGNOSIS — M150 Primary generalized (osteo)arthritis: Secondary | ICD-10-CM | POA: Diagnosis not present

## 2020-08-12 DIAGNOSIS — Z23 Encounter for immunization: Secondary | ICD-10-CM | POA: Diagnosis not present

## 2020-08-12 DIAGNOSIS — R001 Bradycardia, unspecified: Secondary | ICD-10-CM | POA: Diagnosis not present

## 2020-08-13 DIAGNOSIS — M5126 Other intervertebral disc displacement, lumbar region: Secondary | ICD-10-CM | POA: Diagnosis not present

## 2020-08-13 DIAGNOSIS — E7800 Pure hypercholesterolemia, unspecified: Secondary | ICD-10-CM | POA: Diagnosis not present

## 2020-08-13 DIAGNOSIS — I10 Essential (primary) hypertension: Secondary | ICD-10-CM | POA: Diagnosis not present

## 2020-08-14 DIAGNOSIS — I10 Essential (primary) hypertension: Secondary | ICD-10-CM | POA: Diagnosis not present

## 2020-08-16 DIAGNOSIS — M5126 Other intervertebral disc displacement, lumbar region: Secondary | ICD-10-CM | POA: Diagnosis not present

## 2020-08-18 DIAGNOSIS — M5126 Other intervertebral disc displacement, lumbar region: Secondary | ICD-10-CM | POA: Diagnosis not present

## 2020-08-23 DIAGNOSIS — M5126 Other intervertebral disc displacement, lumbar region: Secondary | ICD-10-CM | POA: Diagnosis not present

## 2020-08-25 DIAGNOSIS — M5126 Other intervertebral disc displacement, lumbar region: Secondary | ICD-10-CM | POA: Diagnosis not present

## 2020-09-01 DIAGNOSIS — J323 Chronic sphenoidal sinusitis: Secondary | ICD-10-CM | POA: Diagnosis not present

## 2020-09-01 DIAGNOSIS — G4489 Other headache syndrome: Secondary | ICD-10-CM | POA: Diagnosis not present

## 2020-09-01 DIAGNOSIS — J3489 Other specified disorders of nose and nasal sinuses: Secondary | ICD-10-CM | POA: Diagnosis not present

## 2020-09-01 DIAGNOSIS — Z950 Presence of cardiac pacemaker: Secondary | ICD-10-CM | POA: Diagnosis not present

## 2020-09-01 DIAGNOSIS — M549 Dorsalgia, unspecified: Secondary | ICD-10-CM | POA: Diagnosis not present

## 2020-09-01 DIAGNOSIS — I440 Atrioventricular block, first degree: Secondary | ICD-10-CM | POA: Diagnosis not present

## 2020-09-01 DIAGNOSIS — R531 Weakness: Secondary | ICD-10-CM | POA: Diagnosis not present

## 2020-09-01 DIAGNOSIS — Z880 Allergy status to penicillin: Secondary | ICD-10-CM | POA: Diagnosis not present

## 2020-09-01 DIAGNOSIS — Z20822 Contact with and (suspected) exposure to covid-19: Secondary | ICD-10-CM | POA: Diagnosis not present

## 2020-09-01 DIAGNOSIS — M5126 Other intervertebral disc displacement, lumbar region: Secondary | ICD-10-CM | POA: Diagnosis not present

## 2020-09-01 DIAGNOSIS — R55 Syncope and collapse: Secondary | ICD-10-CM | POA: Diagnosis not present

## 2020-09-01 DIAGNOSIS — R9431 Abnormal electrocardiogram [ECG] [EKG]: Secondary | ICD-10-CM | POA: Diagnosis not present

## 2020-09-03 DIAGNOSIS — M5126 Other intervertebral disc displacement, lumbar region: Secondary | ICD-10-CM | POA: Diagnosis not present

## 2020-09-14 DIAGNOSIS — I10 Essential (primary) hypertension: Secondary | ICD-10-CM | POA: Diagnosis not present

## 2020-09-14 DIAGNOSIS — E7800 Pure hypercholesterolemia, unspecified: Secondary | ICD-10-CM | POA: Diagnosis not present

## 2020-09-20 ENCOUNTER — Telehealth: Payer: Self-pay | Admitting: *Deleted

## 2020-09-20 NOTE — Telephone Encounter (Signed)
-----   Message from Massie Maroon, [place] sent at 09/20/2020 11:05 AM EST -----  ----- Message ----- From: Verta Ellen., NP Sent: 09/15/2020   6:15 PM EST To: Laurine Blazer, LPN  There is no need to call the patient with the cardiac event monitor report. Since we ordered the monitor she has subsequently had a dual chamber pacemaker placed by Dr Rayann Heman. Thank You

## 2020-09-23 ENCOUNTER — Telehealth: Payer: Self-pay

## 2020-09-23 ENCOUNTER — Ambulatory Visit (INDEPENDENT_AMBULATORY_CARE_PROVIDER_SITE_OTHER): Payer: Medicare Other

## 2020-09-23 DIAGNOSIS — I441 Atrioventricular block, second degree: Secondary | ICD-10-CM

## 2020-09-23 LAB — CUP PACEART REMOTE DEVICE CHECK
Battery Remaining Longevity: 143 mo
Battery Voltage: 3.19 V
Brady Statistic AP VP Percent: 9 %
Brady Statistic AP VS Percent: 0.08 %
Brady Statistic AS VP Percent: 88.97 %
Brady Statistic AS VS Percent: 1.95 %
Brady Statistic RA Percent Paced: 9.07 %
Brady Statistic RV Percent Paced: 97.97 %
Date Time Interrogation Session: 20211209030243
Implantable Lead Implant Date: 20210909
Implantable Lead Implant Date: 20210909
Implantable Lead Location: 753859
Implantable Lead Location: 753860
Implantable Lead Model: 3830
Implantable Lead Model: 5076
Implantable Pulse Generator Implant Date: 20210909
Lead Channel Impedance Value: 361 Ohm
Lead Channel Impedance Value: 399 Ohm
Lead Channel Impedance Value: 513 Ohm
Lead Channel Impedance Value: 551 Ohm
Lead Channel Pacing Threshold Amplitude: 0.5 V
Lead Channel Pacing Threshold Amplitude: 0.75 V
Lead Channel Pacing Threshold Pulse Width: 0.4 ms
Lead Channel Pacing Threshold Pulse Width: 0.4 ms
Lead Channel Sensing Intrinsic Amplitude: 1.5 mV
Lead Channel Sensing Intrinsic Amplitude: 1.5 mV
Lead Channel Sensing Intrinsic Amplitude: 9.125 mV
Lead Channel Sensing Intrinsic Amplitude: 9.125 mV
Lead Channel Setting Pacing Amplitude: 1.5 V
Lead Channel Setting Pacing Amplitude: 2 V
Lead Channel Setting Pacing Pulse Width: 0.4 ms
Lead Channel Setting Sensing Sensitivity: 0.9 mV

## 2020-09-23 NOTE — Telephone Encounter (Signed)
[facility] scheduled remote received 09/23/20.   There were four NSVT arrhythmias detected and 33 fast A&V events, these all appear to be supraventricular in nature and are 154 bpm.  Longest in duration 3 minutes 21 seconds. Patient reports she has experienced fatigue and shortness of breath while completing ADLs x3-4 weeks. Reports she has checked her blood pressure, this am 130/70. Denies any other symptoms.  ED precautions given. Patient has follow up with Dr. Rayann Heman 10/22/20  Advised patient I will route to Dr. Rayann Heman for review and we will call with changes. ED precautions given.

## 2020-09-27 ENCOUNTER — Telehealth: Payer: Self-pay | Admitting: Internal Medicine

## 2020-09-27 MED ORDER — METOPROLOL SUCCINATE ER 25 MG PO TB24: 25.0000 mg | ORAL_TABLET | Freq: Every day | ORAL | 3 refills | Status: DC

## 2020-09-27 NOTE — Telephone Encounter (Signed)
Patient reports she has noticed her heart rate has been elevated more than usual for about 3 weeks. States yesterday she had episode where her heart rate would speed up then slow down, lasted approx. 2-3 minutes. Reports during episodes she feels fatigued and short of breath. States she started Leflunomide (immunosuppreive), 1 month ago, does require liver function testing. Reports a side effect she noted was "fast/pounding heart rate." Patient states this is the only change within the past month.   No BB or ADD on file.  ED precautions given.   Advised patient we will call to follow up regarding changes.   Called patient to send manual transmission.  Presenting: AS/VP 90 bpm w/ PAC's.  4 NSVT events logged appear (1:1), some appear SVT w/ PAC's, 2 possible AF events (d/t irregularity) (longest duration 3.5 minutes), 5 SVT events (longest duration 22 minutes).

## 2020-09-27 NOTE — Telephone Encounter (Signed)
Called and advised the patient that Dr. Rayann Heman wants to start her on metoprolol succinate 25 mg daily.  She agrees and verbalized understand.

## 2020-09-27 NOTE — Telephone Encounter (Signed)
After hours patient call:  "Pt's heart pace maker has been going off over 100bm for the last few weeks.."  *Called patient to obtain additional information*  Patient c/o Palpitations:  High priority if patient c/o lightheadedness, shortness of breath, or chest pain  1) How long have you had palpitations/irregular HR/ Afib? Are you having the symptoms now? Patient states she was experiencing palpitations, but it only lasted about 2-3 minutes yesterday  2) Are you currently experiencing lightheadedness, SOB or CP? No   3) Do you have a history of afib (atrial fibrillation) or irregular heart rhythm? No   4) Have you checked your BP or HR? (document readings if available): No readings available  5) Are you experiencing any other symptoms?  She states she has not checked her HR this morning, but on 09/26/20 she had an episode of mild lightheadedness and palpitations. She states her heart was beating so fast that she was unable to obtain any readings. This episode lasted about 2-3 minutes and then it went away. She states she feels fine today, but she would like advisement from Dr. Rayann Heman.

## 2020-09-30 DIAGNOSIS — R768 Other specified abnormal immunological findings in serum: Secondary | ICD-10-CM | POA: Diagnosis not present

## 2020-10-05 DIAGNOSIS — M47816 Spondylosis without myelopathy or radiculopathy, lumbar region: Secondary | ICD-10-CM | POA: Diagnosis not present

## 2020-10-05 DIAGNOSIS — M545 Low back pain, unspecified: Secondary | ICD-10-CM | POA: Diagnosis not present

## 2020-10-05 DIAGNOSIS — M546 Pain in thoracic spine: Secondary | ICD-10-CM | POA: Diagnosis not present

## 2020-10-06 NOTE — Progress Notes (Signed)
Remote pacemaker transmission.   

## 2020-10-11 DIAGNOSIS — D261 Other benign neoplasm of corpus uteri: Secondary | ICD-10-CM | POA: Diagnosis not present

## 2020-10-11 DIAGNOSIS — N83202 Unspecified ovarian cyst, left side: Secondary | ICD-10-CM | POA: Diagnosis not present

## 2020-10-11 DIAGNOSIS — N83201 Unspecified ovarian cyst, right side: Secondary | ICD-10-CM | POA: Diagnosis not present

## 2020-10-14 DIAGNOSIS — E7800 Pure hypercholesterolemia, unspecified: Secondary | ICD-10-CM | POA: Diagnosis not present

## 2020-10-14 DIAGNOSIS — I10 Essential (primary) hypertension: Secondary | ICD-10-CM | POA: Diagnosis not present

## 2020-10-22 ENCOUNTER — Encounter: Payer: Self-pay | Admitting: Internal Medicine

## 2020-10-22 ENCOUNTER — Ambulatory Visit: Payer: Medicare Other | Admitting: Internal Medicine

## 2020-10-22 VITALS — BP 150/80 | HR 78 | Ht 62.0 in | Wt 137.4 lb

## 2020-10-22 DIAGNOSIS — I10 Essential (primary) hypertension: Secondary | ICD-10-CM

## 2020-10-22 DIAGNOSIS — I441 Atrioventricular block, second degree: Secondary | ICD-10-CM

## 2020-10-22 DIAGNOSIS — G4733 Obstructive sleep apnea (adult) (pediatric): Secondary | ICD-10-CM | POA: Diagnosis not present

## 2020-10-22 NOTE — Progress Notes (Signed)
PCP: Glenda Chroman, MD   Primary EP:  Dr Rayann Heman  Susan Hicks is a 75 y.o. female who presents today for routine electrophysiology followup.  Since her PPM implant, the patient reports doing very well.  Today, she denies symptoms of palpitations, chest pain, shortness of breath,  lower extremity edema, dizziness, presyncope, or syncope.  The patient is otherwise without complaint today.   Past Medical History:  Diagnosis Date  . Depression   . Fibromyalgia    now considered mixed connective tissue disorder  . Generalized anxiety disorder 08/21/2018  . GERD (gastroesophageal reflux disease)   . Hypertension   . Irritable bowel   . Junctional bradycardia    a. 2017 - beta blocker discontinued.  Marland Kitchen LBBB (left bundle branch block)   . Mixed connective tissue disease ([place])   . Obstructive sleep apnea treated with continuous positive airway pressure (CPAP) 08/21/2018  . Osteoporosis   . Premature atrial contractions   . Primary localized osteoarthritis of right knee    Past Surgical History:  Procedure Laterality Date  . BACK SURGERY    . CHOLECYSTECTOMY    . HAND SURGERY  2006  . PACEMAKER IMPLANT N/A 06/24/2020   Procedure: PACEMAKER IMPLANT;  Surgeon: Thompson Grayer, MD;  Location: [place] [facility];  Service: Cardiovascular;  Laterality: N/A;  . TOTAL KNEE ARTHROPLASTY Right 09/02/2018   Procedure: TOTAL KNEE ARTHROPLASTY;  Surgeon: Elsie Saas, MD;  Location: [place];  Service: Orthopedics;  Laterality: Right;    ROS- all systems are reviewed and negative except as per HPI above  Current Outpatient Medications  Medication Sig Dispense Refill  . acetaminophen (TYLENOL) 500 MG tablet Take 500 mg by mouth at bedtime as needed for moderate pain.     . Artificial Tear Solution (SOOTHE XP OP) Place 1 drop into both eyes in the morning and at bedtime.    Marland Kitchen b complex vitamins tablet Take 1 tablet by mouth daily.    . busPIRone (BUSPAR) 15 MG tablet Take 15 mg by mouth 2  (two) times daily.    . Calcium Carb-Cholecalciferol (CALCIUM PLUS VITAMIN D3) 600-500 MG-UNIT CAPS Take 1 tablet by mouth daily.    . cetirizine (ZYRTEC) 10 MG tablet Take 10 mg by mouth daily.    . chlorpheniramine (CHLOR-TRIMETON) 4 MG tablet Take 4 mg by mouth at bedtime as needed for allergies.    . Cholecalciferol (VITAMIN D3) 2000 units TABS Take 2,000 Units by mouth daily.     . diphenhydramine-acetaminophen (TYLENOL PM) 25-500 MG TABS tablet Take 1 tablet by mouth at bedtime.     Marland Kitchen leflunomide (ARAVA) 20 MG tablet Take 20 mg by mouth daily.    Marland Kitchen LORazepam (ATIVAN) 0.5 MG tablet Take 0.5 mg by mouth at bedtime.    . Magnesium 250 MG TABS Take 250 mg by mouth at bedtime.    . melatonin 5 MG TABS Take 5 mg by mouth at bedtime.    . meloxicam (MOBIC) 7.5 MG tablet Take 7.5 mg by mouth in the morning and at bedtime.    . metoprolol succinate (TOPROL XL) 25 MG 24 hr tablet Take 1 tablet (25 mg total) by mouth daily. 90 tablet 3  . Multiple Vitamins-Minerals (ADULT GUMMY) CHEW Chew 2 tablets by mouth daily.    Marland Kitchen oxybutynin (DITROPAN) 5 MG tablet Take 5 mg by mouth daily.    . pantoprazole (PROTONIX) 40 MG tablet Take 40 mg by mouth daily.    Marland Kitchen  PRESCRIPTION MEDICATION Inhale into the lungs at bedtime. CPAP    . Probiotic Product (RA PROBIOTIC GUMMIES PO) Take 1 tablet by mouth daily at 6 (six) AM.    . Psyllium (METAMUCIL FIBER PO) Take 1 Dose by mouth daily.    . sertraline (ZOLOFT) 100 MG tablet Take 150 mg by mouth daily.    . traMADol (ULTRAM) 50 MG tablet Take 50 mg by mouth every 6 (six) hours as needed for moderate pain (hip / back pain).    . Turmeric Curcumin 500 MG CAPS Take 500 mg by mouth at bedtime.    . valsartan-hydrochlorothiazide (DIOVAN-HCT) 160-25 MG tablet Take 1 tablet by mouth daily.     No current facility-administered medications for this visit.    Physical Exam: Vitals:   10/22/20 1107  BP: (!) 150/80  Pulse: 78  SpO2: 97%  Weight: 137 lb 6.4 oz (62.3 kg)   Height: 5\' 2"  (1.575 m)    GEN- The patient is well appearing, alert and oriented x 3 today.   Head- normocephalic, atraumatic Eyes-  Sclera clear, conjunctiva pink Ears- hearing intact Oropharynx- clear Lungs- Clear to ausculation bilaterally, normal work of breathing Chest- pacemaker pocket is well healed Heart- Regular rate and rhythm, no murmurs, rubs or gallops, PMI not laterally displaced GI- soft, NT, ND, + BS Extremities- no clubbing, cyanosis, or edema  Pacemaker interrogation- reviewed in detail today,  See PACEART report   Assessment and Plan:  1. Symptomatic sinus bradycardia and  mobitz II second degree heart block Normal pacemaker function See Pace Art report AV delay adjusted to promote intrinsic conduction today V pacing output adjusted to chronic settings she is not device dependant today  2. HTN Stable No change required today  3. OSA Compliance with therapy advised  Risks, benefits and potential toxicities for medications prescribed and/or refilled reviewed with patient today.   Return in a year  Thompson Grayer MD, [facility] 10/22/2020 11:15 AM

## 2020-10-22 NOTE — Patient Instructions (Signed)
Medication Instructions:  Your physician recommends that you continue on your current medications as directed. Please refer to the Current Medication list given to you today.  Labwork: none  Testing/Procedures: none  Follow-Up: Your physician recommends that you schedule a follow-up appointment in: 1 year.   Any Other Special Instructions Will Be Listed Below (If Applicable).  If you need a refill on your cardiac medications before your next appointment, please call your pharmacy. 

## 2020-11-15 DIAGNOSIS — M797 Fibromyalgia: Secondary | ICD-10-CM | POA: Diagnosis not present

## 2020-11-15 DIAGNOSIS — G4700 Insomnia, unspecified: Secondary | ICD-10-CM | POA: Diagnosis not present

## 2020-11-15 DIAGNOSIS — I10 Essential (primary) hypertension: Secondary | ICD-10-CM | POA: Diagnosis not present

## 2020-11-29 DIAGNOSIS — I7300 Raynaud's syndrome without gangrene: Secondary | ICD-10-CM | POA: Diagnosis not present

## 2020-11-29 DIAGNOSIS — M150 Primary generalized (osteo)arthritis: Secondary | ICD-10-CM | POA: Diagnosis not present

## 2020-11-29 DIAGNOSIS — R001 Bradycardia, unspecified: Secondary | ICD-10-CM | POA: Diagnosis not present

## 2020-11-29 DIAGNOSIS — Z6823 Body mass index (BMI) 23.0-23.9, adult: Secondary | ICD-10-CM | POA: Diagnosis not present

## 2020-11-29 DIAGNOSIS — M351 Other overlap syndromes: Secondary | ICD-10-CM | POA: Diagnosis not present

## 2020-11-29 DIAGNOSIS — M3500 Sicca syndrome, unspecified: Secondary | ICD-10-CM | POA: Diagnosis not present

## 2020-11-29 DIAGNOSIS — M25551 Pain in right hip: Secondary | ICD-10-CM | POA: Diagnosis not present

## 2020-11-29 DIAGNOSIS — Z79899 Other long term (current) drug therapy: Secondary | ICD-10-CM | POA: Diagnosis not present

## 2020-11-29 DIAGNOSIS — M2550 Pain in unspecified joint: Secondary | ICD-10-CM | POA: Diagnosis not present

## 2020-11-29 DIAGNOSIS — M797 Fibromyalgia: Secondary | ICD-10-CM | POA: Diagnosis not present

## 2020-12-01 DIAGNOSIS — I10 Essential (primary) hypertension: Secondary | ICD-10-CM | POA: Diagnosis not present

## 2020-12-01 DIAGNOSIS — Z299 Encounter for prophylactic measures, unspecified: Secondary | ICD-10-CM | POA: Diagnosis not present

## 2020-12-01 DIAGNOSIS — G4700 Insomnia, unspecified: Secondary | ICD-10-CM | POA: Diagnosis not present

## 2020-12-01 DIAGNOSIS — M159 Polyosteoarthritis, unspecified: Secondary | ICD-10-CM | POA: Diagnosis not present

## 2020-12-13 DIAGNOSIS — I10 Essential (primary) hypertension: Secondary | ICD-10-CM | POA: Diagnosis not present

## 2020-12-20 LAB — CUP PACEART INCLINIC DEVICE CHECK
Date Time Interrogation Session: 20220107104500
Implantable Lead Implant Date: 20210909
Implantable Lead Implant Date: 20210909
Implantable Lead Location: 753859
Implantable Lead Location: 753860
Implantable Lead Model: 3830
Implantable Lead Model: 5076
Implantable Pulse Generator Implant Date: 20210909
Lead Channel Pacing Threshold Amplitude: 0.5 V
Lead Channel Pacing Threshold Amplitude: 0.75 V
Lead Channel Pacing Threshold Pulse Width: 0.4 ms
Lead Channel Pacing Threshold Pulse Width: 0.4 ms
Lead Channel Sensing Intrinsic Amplitude: 10.9 mV
Lead Channel Sensing Intrinsic Amplitude: 3.4 mV

## 2020-12-23 ENCOUNTER — Ambulatory Visit (INDEPENDENT_AMBULATORY_CARE_PROVIDER_SITE_OTHER): Payer: Medicare Other

## 2020-12-23 DIAGNOSIS — I441 Atrioventricular block, second degree: Secondary | ICD-10-CM

## 2020-12-26 LAB — CUP PACEART REMOTE DEVICE CHECK
Battery Remaining Longevity: 130 mo
Battery Voltage: 3.16 V
Brady Statistic AP VP Percent: 13.79 %
Brady Statistic AP VS Percent: 2.18 %
Brady Statistic AS VP Percent: 34.07 %
Brady Statistic AS VS Percent: 49.97 %
Brady Statistic RA Percent Paced: 15.95 %
Brady Statistic RV Percent Paced: 47.86 %
Date Time Interrogation Session: 20220310031230
Implantable Lead Implant Date: 20210909
Implantable Lead Implant Date: 20210909
Implantable Lead Location: 753859
Implantable Lead Location: 753860
Implantable Lead Model: 3830
Implantable Lead Model: 5076
Implantable Pulse Generator Implant Date: 20210909
Lead Channel Impedance Value: 361 Ohm
Lead Channel Impedance Value: 437 Ohm
Lead Channel Impedance Value: 456 Ohm
Lead Channel Impedance Value: 494 Ohm
Lead Channel Pacing Threshold Amplitude: 0.5 V
Lead Channel Pacing Threshold Amplitude: 0.625 V
Lead Channel Pacing Threshold Pulse Width: 0.4 ms
Lead Channel Pacing Threshold Pulse Width: 0.4 ms
Lead Channel Sensing Intrinsic Amplitude: 13.875 mV
Lead Channel Sensing Intrinsic Amplitude: 13.875 mV
Lead Channel Sensing Intrinsic Amplitude: 2.5 mV
Lead Channel Sensing Intrinsic Amplitude: 2.5 mV
Lead Channel Setting Pacing Amplitude: 2 V
Lead Channel Setting Pacing Amplitude: 2.5 V
Lead Channel Setting Pacing Pulse Width: 0.4 ms
Lead Channel Setting Sensing Sensitivity: 0.9 mV

## 2020-12-28 DIAGNOSIS — Z79899 Other long term (current) drug therapy: Secondary | ICD-10-CM | POA: Diagnosis not present

## 2020-12-28 DIAGNOSIS — E7800 Pure hypercholesterolemia, unspecified: Secondary | ICD-10-CM | POA: Diagnosis not present

## 2020-12-28 DIAGNOSIS — Z7189 Other specified counseling: Secondary | ICD-10-CM | POA: Diagnosis not present

## 2020-12-28 DIAGNOSIS — Z0000 Encounter for general adult medical examination without abnormal findings: Secondary | ICD-10-CM | POA: Diagnosis not present

## 2020-12-28 DIAGNOSIS — R5383 Other fatigue: Secondary | ICD-10-CM | POA: Diagnosis not present

## 2020-12-28 DIAGNOSIS — Z299 Encounter for prophylactic measures, unspecified: Secondary | ICD-10-CM | POA: Diagnosis not present

## 2020-12-28 DIAGNOSIS — I10 Essential (primary) hypertension: Secondary | ICD-10-CM | POA: Diagnosis not present

## 2020-12-31 NOTE — Progress Notes (Signed)
Remote pacemaker transmission.   

## 2021-01-12 DIAGNOSIS — I482 Chronic atrial fibrillation, unspecified: Secondary | ICD-10-CM | POA: Diagnosis not present

## 2021-01-12 DIAGNOSIS — M797 Fibromyalgia: Secondary | ICD-10-CM | POA: Diagnosis not present

## 2021-01-12 DIAGNOSIS — G4700 Insomnia, unspecified: Secondary | ICD-10-CM | POA: Diagnosis not present

## 2021-01-13 DIAGNOSIS — I10 Essential (primary) hypertension: Secondary | ICD-10-CM | POA: Diagnosis not present

## 2021-02-07 ENCOUNTER — Telehealth: Payer: Self-pay | Admitting: Internal Medicine

## 2021-02-07 NOTE — Telephone Encounter (Signed)
Reports for the past 3 weeks, being SOB after walking to the mailbox. Also says she is SOB just sitting. Reports occasionally having expiratory wheezing. Reports having seasonal allergies. Denies palpitations, swelling, weight gain, chest pain or dizziness. Denies, fever, chills, congestion or coughing. Reports BP was 96/56 Easter Sunday and she felt exhausted. Reports BP has been normal since then 110/70 & HR 68. First available appointment given to see Levell July, NP on 02/14/21 @1 :30 pm. Advised to contact PCP for a sooner appointment. Reports that she is currently not at home so advised to send in a transmission when possible. Verbalized understanding of plan.

## 2021-02-07 NOTE — Telephone Encounter (Signed)
Pt called stating that her heart rate is going up just walking to the mailbox and she's just exhausted when she gets back into the house- sometimes she gets breathless walking around her yard.   Please call pt @ [phone]

## 2021-02-09 DIAGNOSIS — G4730 Sleep apnea, unspecified: Secondary | ICD-10-CM | POA: Diagnosis not present

## 2021-02-09 DIAGNOSIS — Z299 Encounter for prophylactic measures, unspecified: Secondary | ICD-10-CM | POA: Diagnosis not present

## 2021-02-09 DIAGNOSIS — I10 Essential (primary) hypertension: Secondary | ICD-10-CM | POA: Diagnosis not present

## 2021-02-09 DIAGNOSIS — R0600 Dyspnea, unspecified: Secondary | ICD-10-CM | POA: Diagnosis not present

## 2021-02-11 DIAGNOSIS — I10 Essential (primary) hypertension: Secondary | ICD-10-CM | POA: Diagnosis not present

## 2021-02-13 NOTE — Progress Notes (Signed)
Cardiology Office Note  Date: 02/14/2021   ID: Susan Hicks, DOB June 09, 1946, MRN [id number]  PCP:  Glenda Chroman, MD  Cardiologist:  Carlyle Dolly, MD Electrophysiologist:  Cristopher Peru, MD   Chief Complaint: SOB  History of Present Illness: Susan Hicks is a 75 y.o. female with a history of HTN, LBBB, AIVR, Second degree AV block Mobitz 1, OSA on CPAP, bradycardia, Prolonged QT intervals.    Last seen by Dr Rayann Heman 10/22/2020 for EP follow up since Pacemaker implant. She denied any palpitations, chest pain, SOB, LE edema, dizziness, presyncope or syncope. AV delay was adjusted on PPM to promote intrinsic conduction, V pacing output was adjusted to chronic settings. BP was stable on current medical therapy. Compliance with CPAP was advised.   Recent telephone encounter on 02/07/2021 she reported 3 weeks of being short of breath after walking to mailbox and with sitting.  Occasional expiratory wheezing and history of seasonal allergies.  Blood pressure was 96/56.  Easter Sunday she felt exhausted.  She reported her blood pressure being normal since that time at 110/70 and heart rate of 68.  She is here today status post above-mentioned complaints.  Shortness of breath.  She states she went to see her primary care provider who suggested she take antianxiety medication if she felt that way again.  She states her shortness of breath is much better now.  She denies any palpitations or arrhythmias.  She had a dual-chamber pacemaker placed back in September 2021 Dr. Rayann Heman.  Recently saw Dr. Rayann Heman October 22, 2020.  Patient states last week when she is scheduled the appointment she was called and asked to download a recent pacemaker activity.  I do not see a report in the system for this.  Currently denies any significant DOE, SOB, palpitations or arrhythmias, orthostatic symptoms, CVA or TIA-like symptoms, PND, orthopnea.  Denies any bleeding.  No claudication-like symptoms, DVT or  PE-like symptoms, lower extremity edema.  Echocardiogram last year in August 2021 demonstrated EF of 55 to 60%.  No WMA's.  G1 DD.  Trivial MR.   Past Medical History:  Diagnosis Date  . Depression   . Fibromyalgia    now considered mixed connective tissue disorder  . Generalized anxiety disorder 08/21/2018  . GERD (gastroesophageal reflux disease)   . Hypertension   . Irritable bowel   . Junctional bradycardia    a. 2017 - beta blocker discontinued.  Marland Kitchen LBBB (left bundle branch block)   . Mixed connective tissue disease ([place])   . Obstructive sleep apnea treated with continuous positive airway pressure (CPAP) 08/21/2018  . Osteoporosis   . Premature atrial contractions   . Primary localized osteoarthritis of right knee     Past Surgical History:  Procedure Laterality Date  . BACK SURGERY    . CHOLECYSTECTOMY    . HAND SURGERY  2006  . PACEMAKER IMPLANT N/A 06/24/2020   Procedure: PACEMAKER IMPLANT;  Surgeon: Thompson Grayer, MD;  Location: [facility];  Service: Cardiovascular;  Laterality: N/A;  . TOTAL KNEE ARTHROPLASTY Right 09/02/2018   Procedure: TOTAL KNEE ARTHROPLASTY;  Surgeon: Elsie Saas, MD;  Location: [facility];  Service: Orthopedics;  Laterality: Right;    Current Outpatient Medications  Medication Sig Dispense Refill  . acetaminophen (TYLENOL) 500 MG tablet Take 500 mg by mouth at bedtime as needed for moderate pain.     . Artificial Tear Solution (SOOTHE XP OP) Place 1 drop into both eyes in the morning and  at bedtime.    Marland Kitchen b complex vitamins tablet Take 1 tablet by mouth daily.    . busPIRone (BUSPAR) 15 MG tablet Take 15 mg by mouth 2 (two) times daily.    . Calcium Carb-Cholecalciferol (CALCIUM PLUS VITAMIN D3) 600-500 MG-UNIT CAPS Take 1 tablet by mouth daily.    . cetirizine (ZYRTEC) 10 MG tablet Take 10 mg by mouth daily.    . chlorpheniramine (CHLOR-TRIMETON) 4 MG tablet Take 4 mg by mouth at bedtime as needed for allergies.    . Cholecalciferol (VITAMIN  D3) 2000 units TABS Take 2,000 Units by mouth daily.     . diphenhydramine-acetaminophen (TYLENOL PM) 25-500 MG TABS tablet Take 1 tablet by mouth at bedtime.     Marland Kitchen leflunomide (ARAVA) 20 MG tablet Take 20 mg by mouth daily.    Marland Kitchen LORazepam (ATIVAN) 0.5 MG tablet Take 0.5 mg by mouth at bedtime.    . Magnesium 250 MG TABS Take 250 mg by mouth at bedtime.    . melatonin 5 MG TABS Take 5 mg by mouth at bedtime.    . meloxicam (MOBIC) 7.5 MG tablet Take 7.5 mg by mouth in the morning and at bedtime.    . metoprolol succinate (TOPROL XL) 25 MG 24 hr tablet Take 1 tablet (25 mg total) by mouth daily. 90 tablet 3  . Multiple Vitamins-Minerals (ADULT GUMMY) CHEW Chew 2 tablets by mouth daily.    Marland Kitchen oxybutynin (DITROPAN) 5 MG tablet Take 5 mg by mouth daily.    . pantoprazole (PROTONIX) 40 MG tablet Take 40 mg by mouth daily.    Marland Kitchen PRESCRIPTION MEDICATION Inhale into the lungs at bedtime. CPAP    . Probiotic Product (RA PROBIOTIC GUMMIES PO) Take 1 tablet by mouth daily at 6 (six) AM.    . Psyllium (METAMUCIL FIBER PO) Take 1 Dose by mouth daily.    . sertraline (ZOLOFT) 100 MG tablet Take 150 mg by mouth daily.    . traMADol (ULTRAM) 50 MG tablet Take 50 mg by mouth every 6 (six) hours as needed for moderate pain (hip / back pain).    . Turmeric Curcumin 500 MG CAPS Take 500 mg by mouth at bedtime.    . valsartan-hydrochlorothiazide (DIOVAN-HCT) 160-25 MG tablet Take 1 tablet by mouth daily.     No current facility-administered medications for this visit.   Allergies:  Lisinopril, Beta adrenergic blockers, Gluten meal, Penicillins, and Norvasc [amlodipine]   Social History: The patient  reports that she has never smoked. She has never used smokeless tobacco. She reports that she does not drink alcohol and does not use drugs.   Family History: The patient's family history includes Heart attack in her father; Hypertension in her daughter, father, mother, sister, and son; Pulmonary disease in her father;  Thyroid disease in her mother and sister.   ROS:  Please see the history of present illness. Otherwise, complete review of systems is positive for none.  All other systems are reviewed and negative.   Physical Exam: VS:  BP 138/70   Pulse 82   Ht 5\' 3"  (1.6 m)   Wt 133 lb (60.3 kg)   SpO2 94%   BMI 23.56 kg/m , BMI Body mass index is 23.56 kg/m.  Wt Readings from Last 3 Encounters:  02/14/21 133 lb (60.3 kg)  10/22/20 137 lb 6.4 oz (62.3 kg)  07/18/20 142 lb (64.4 kg)    General: Patient appears comfortable at rest. Neck: Supple, no elevated JVP or  carotid bruits, no thyromegaly. Lungs: Clear to auscultation, nonlabored breathing at rest. Cardiac: Regular rate and rhythm, no S3 or significant systolic murmur, no pericardial rub. Extremities: No pitting edema, distal pulses 2+. Skin: Warm and dry. Musculoskeletal: No kyphosis. Neuropsychiatric: Alert and oriented x3, affect grossly appropriate.  ECG:  EKG 07/18/2020 atrial sensed ventricular paced rhythm rate of 73.  Recent Labwork: 07/18/2020: BUN 21; Creatinine, Ser 1.34; Hemoglobin 11.1; Platelets 261; Potassium 4.4; Sodium 137     Component Value Date/Time   CHOL 203 (H) 04/13/2016 1102   TRIG 94 04/13/2016 1102   HDL 59 04/13/2016 1102   CHOLHDL 3.4 04/13/2016 1102   VLDL 19 04/13/2016 1102   LDLCALC 125 (H) 04/13/2016 1102    Other Studies Reviewed Today:  Echocardiogram 05/31/2020  1. Left ventricular ejection fraction, by estimation, is 55 to 60%. The left ventricle has normal function. The left ventricle has no regional wall motion abnormalities. Left ventricular diastolic parameters are consistent with Grade I diastolic dysfunction (impaired relaxation). Elevated left ventricular end-diastolic pressure. The average left ventricular global longitudinal strain is -22.9 %. The global longitudinal strain is normal. 2. Right ventricular systolic function is normal. The right ventricular size is normal. There  is normal pulmonary artery systolic pressure. The estimated right ventricular systolic pressure is 62.2 mmHg. 3. The mitral valve is abnormal. Trivial mitral valve regurgitation. 4. The aortic valve is tricuspid. Aortic valve regurgitation is not visualized. Mild aortic valve sclerosis is present, with no evidence of aortic valve stenosis. 5. The inferior vena cava is normal in size with greater than 50% respiratory variability, suggesting right atrial pressure of 3 mmHg.  Assessment and Plan:  1. SOB (shortness of breath)   2. Second degree AV block, Mobitz type I   3. Essential hypertension   4. Obstructive sleep apnea treated with continuous positive airway pressure (CPAP)    1. SOB (shortness of breath) Last week on 02/07/2021 she reported 3-week history of shortness of breath when walking to her mailbox and occasionally at rest.  She subsequently went to see her primary care provider who recommended she take her antianxiety medication for relief.  She states since seeing her primary care provider her shortness of breath has resolved.  Echocardiogram last year in August 2021 demonstrated EF of 55 to 60%.  No WMA's.  G1 DD.  Trivial MR. Advised that if she had progressing DOE or activity intolerance we may need to repeat an echocardiogram.   2. Second degree AV block, Mobitz type I  Status post dual-chamber pacemaker placement by Dr. Rayann Heman September 2021.  She states last week when she called someone had called her back and asked her to download her most recent activity on pacemaker.  I see no evidence of a recent remote pacemaker check from last week.  Most recent device check was normal per Dr. Rayann Heman.  She denies any recent palpitations when she was complaining of shortness of breath.  She has a  Medtronic Azure XT DR MRI SureScan model [id number] (serial number [id number]) pacemaker.    3. Essential hypertension Blood pressure slightly elevated at 138/70 today.  Continue Toprol-XL 25 mg  p.o. daily.  Continue Diovan 160/25 mg p.o. daily   4. Obstructive sleep apnea treated with continuous positive airway pressure (CPAP) Patient states she is compliant with CPAP therapy.  Medication Adjustments/Labs and Tests Ordered: Current medicines are reviewed at length with the patient today.  Concerns regarding medicines are outlined above.   Disposition: Follow-up with Dr.  Branch or APP 1 year  Signed, Levell July, NP 02/14/2021 2:13 PM    [facility] [place], [place], [place] [address] Phone: [phone]; Fax: [phone]

## 2021-02-14 ENCOUNTER — Other Ambulatory Visit: Payer: Self-pay

## 2021-02-14 ENCOUNTER — Ambulatory Visit (INDEPENDENT_AMBULATORY_CARE_PROVIDER_SITE_OTHER): Payer: Medicare Other | Admitting: Family Medicine

## 2021-02-14 ENCOUNTER — Encounter: Payer: Self-pay | Admitting: Family Medicine

## 2021-02-14 VITALS — BP 138/70 | HR 82 | Ht 63.0 in | Wt 133.0 lb

## 2021-02-14 DIAGNOSIS — Z9989 Dependence on other enabling machines and devices: Secondary | ICD-10-CM

## 2021-02-14 DIAGNOSIS — I441 Atrioventricular block, second degree: Secondary | ICD-10-CM

## 2021-02-14 DIAGNOSIS — I10 Essential (primary) hypertension: Secondary | ICD-10-CM | POA: Diagnosis not present

## 2021-02-14 DIAGNOSIS — G4733 Obstructive sleep apnea (adult) (pediatric): Secondary | ICD-10-CM

## 2021-02-14 DIAGNOSIS — R0602 Shortness of breath: Secondary | ICD-10-CM | POA: Diagnosis not present

## 2021-02-14 NOTE — Patient Instructions (Addendum)
Your physician recommends that you schedule a follow-up appointment in: 1 YEAR WITH DR BRANCH  Your physician recommends that you continue on your current medications as directed. Please refer to the Current Medication list given to you today.  Thank you for choosing [facility]!!    

## 2021-02-28 DIAGNOSIS — R001 Bradycardia, unspecified: Secondary | ICD-10-CM | POA: Diagnosis not present

## 2021-02-28 DIAGNOSIS — Z6823 Body mass index (BMI) 23.0-23.9, adult: Secondary | ICD-10-CM | POA: Diagnosis not present

## 2021-02-28 DIAGNOSIS — M351 Other overlap syndromes: Secondary | ICD-10-CM | POA: Diagnosis not present

## 2021-02-28 DIAGNOSIS — M2550 Pain in unspecified joint: Secondary | ICD-10-CM | POA: Diagnosis not present

## 2021-02-28 DIAGNOSIS — M25551 Pain in right hip: Secondary | ICD-10-CM | POA: Diagnosis not present

## 2021-02-28 DIAGNOSIS — M3500 Sicca syndrome, unspecified: Secondary | ICD-10-CM | POA: Diagnosis not present

## 2021-02-28 DIAGNOSIS — Z79899 Other long term (current) drug therapy: Secondary | ICD-10-CM | POA: Diagnosis not present

## 2021-02-28 DIAGNOSIS — M797 Fibromyalgia: Secondary | ICD-10-CM | POA: Diagnosis not present

## 2021-02-28 DIAGNOSIS — I7300 Raynaud's syndrome without gangrene: Secondary | ICD-10-CM | POA: Diagnosis not present

## 2021-02-28 DIAGNOSIS — M150 Primary generalized (osteo)arthritis: Secondary | ICD-10-CM | POA: Diagnosis not present

## 2021-03-14 DIAGNOSIS — I130 Hypertensive heart and chronic kidney disease with heart failure and stage 1 through stage 4 chronic kidney disease, or unspecified chronic kidney disease: Secondary | ICD-10-CM | POA: Diagnosis not present

## 2021-03-14 DIAGNOSIS — E7849 Other hyperlipidemia: Secondary | ICD-10-CM | POA: Diagnosis not present

## 2021-03-14 DIAGNOSIS — I5042 Chronic combined systolic (congestive) and diastolic (congestive) heart failure: Secondary | ICD-10-CM | POA: Diagnosis not present

## 2021-03-15 DIAGNOSIS — I10 Essential (primary) hypertension: Secondary | ICD-10-CM | POA: Diagnosis not present

## 2021-03-18 DIAGNOSIS — M25552 Pain in left hip: Secondary | ICD-10-CM | POA: Diagnosis not present

## 2021-03-18 DIAGNOSIS — M545 Low back pain, unspecified: Secondary | ICD-10-CM | POA: Diagnosis not present

## 2021-03-24 ENCOUNTER — Ambulatory Visit (INDEPENDENT_AMBULATORY_CARE_PROVIDER_SITE_OTHER): Payer: Medicare Other

## 2021-03-24 DIAGNOSIS — I441 Atrioventricular block, second degree: Secondary | ICD-10-CM | POA: Diagnosis not present

## 2021-03-24 LAB — CUP PACEART REMOTE DEVICE CHECK
Battery Remaining Longevity: 130 mo
Battery Voltage: 3.1 V
Brady Statistic AP VP Percent: 18.57 %
Brady Statistic AP VS Percent: 2.93 %
Brady Statistic AS VP Percent: 27.37 %
Brady Statistic AS VS Percent: 51.13 %
Brady Statistic RA Percent Paced: 21.45 %
Brady Statistic RV Percent Paced: 45.94 %
Date Time Interrogation Session: 20220608223940
Implantable Lead Implant Date: 20210909
Implantable Lead Implant Date: 20210909
Implantable Lead Location: 753859
Implantable Lead Location: 753860
Implantable Lead Model: 3830
Implantable Lead Model: 5076
Implantable Pulse Generator Implant Date: 20210909
Lead Channel Impedance Value: 342 Ohm
Lead Channel Impedance Value: 380 Ohm
Lead Channel Impedance Value: 399 Ohm
Lead Channel Impedance Value: 456 Ohm
Lead Channel Pacing Threshold Amplitude: 0.5 V
Lead Channel Pacing Threshold Amplitude: 0.5 V
Lead Channel Pacing Threshold Pulse Width: 0.4 ms
Lead Channel Pacing Threshold Pulse Width: 0.4 ms
Lead Channel Sensing Intrinsic Amplitude: 1.625 mV
Lead Channel Sensing Intrinsic Amplitude: 1.625 mV
Lead Channel Sensing Intrinsic Amplitude: 8.875 mV
Lead Channel Sensing Intrinsic Amplitude: 8.875 mV
Lead Channel Setting Pacing Amplitude: 2 V
Lead Channel Setting Pacing Amplitude: 2.5 V
Lead Channel Setting Pacing Pulse Width: 0.4 ms
Lead Channel Setting Sensing Sensitivity: 0.9 mV

## 2021-04-14 DIAGNOSIS — I10 Essential (primary) hypertension: Secondary | ICD-10-CM | POA: Diagnosis not present

## 2021-04-15 NOTE — Progress Notes (Signed)
Remote pacemaker transmission.   

## 2021-05-11 DIAGNOSIS — Z299 Encounter for prophylactic measures, unspecified: Secondary | ICD-10-CM | POA: Diagnosis not present

## 2021-05-11 DIAGNOSIS — M351 Other overlap syndromes: Secondary | ICD-10-CM | POA: Diagnosis not present

## 2021-05-11 DIAGNOSIS — I10 Essential (primary) hypertension: Secondary | ICD-10-CM | POA: Diagnosis not present

## 2021-05-13 DIAGNOSIS — I10 Essential (primary) hypertension: Secondary | ICD-10-CM | POA: Diagnosis not present

## 2021-05-15 DIAGNOSIS — I10 Essential (primary) hypertension: Secondary | ICD-10-CM | POA: Diagnosis not present

## 2021-05-15 DIAGNOSIS — E7849 Other hyperlipidemia: Secondary | ICD-10-CM | POA: Diagnosis not present

## 2021-05-15 DIAGNOSIS — I130 Hypertensive heart and chronic kidney disease with heart failure and stage 1 through stage 4 chronic kidney disease, or unspecified chronic kidney disease: Secondary | ICD-10-CM | POA: Diagnosis not present

## 2021-05-15 DIAGNOSIS — I5042 Chronic combined systolic (congestive) and diastolic (congestive) heart failure: Secondary | ICD-10-CM | POA: Diagnosis not present

## 2021-06-02 DIAGNOSIS — M351 Other overlap syndromes: Secondary | ICD-10-CM | POA: Diagnosis not present

## 2021-06-15 DIAGNOSIS — I10 Essential (primary) hypertension: Secondary | ICD-10-CM | POA: Diagnosis not present

## 2021-06-22 DIAGNOSIS — R634 Abnormal weight loss: Secondary | ICD-10-CM | POA: Diagnosis not present

## 2021-06-22 DIAGNOSIS — Z79899 Other long term (current) drug therapy: Secondary | ICD-10-CM | POA: Diagnosis not present

## 2021-06-22 DIAGNOSIS — I10 Essential (primary) hypertension: Secondary | ICD-10-CM | POA: Diagnosis not present

## 2021-06-22 DIAGNOSIS — Z299 Encounter for prophylactic measures, unspecified: Secondary | ICD-10-CM | POA: Diagnosis not present

## 2021-06-23 ENCOUNTER — Ambulatory Visit (INDEPENDENT_AMBULATORY_CARE_PROVIDER_SITE_OTHER): Payer: Medicare Other

## 2021-06-23 DIAGNOSIS — I441 Atrioventricular block, second degree: Secondary | ICD-10-CM

## 2021-06-23 LAB — CUP PACEART REMOTE DEVICE CHECK
Battery Remaining Longevity: 128 mo
Battery Voltage: 3.05 V
Brady Statistic AP VP Percent: 29.54 %
Brady Statistic AP VS Percent: 2.9 %
Brady Statistic AS VP Percent: 21.13 %
Brady Statistic AS VS Percent: 46.43 %
Brady Statistic RA Percent Paced: 32.39 %
Brady Statistic RV Percent Paced: 50.67 %
Date Time Interrogation Session: 20220908053412
Implantable Lead Implant Date: 20210909
Implantable Lead Implant Date: 20210909
Implantable Lead Location: 753859
Implantable Lead Location: 753860
Implantable Lead Model: 3830
Implantable Lead Model: 5076
Implantable Pulse Generator Implant Date: 20210909
Lead Channel Impedance Value: 323 Ohm
Lead Channel Impedance Value: 399 Ohm
Lead Channel Impedance Value: 418 Ohm
Lead Channel Impedance Value: 437 Ohm
Lead Channel Pacing Threshold Amplitude: 0.5 V
Lead Channel Pacing Threshold Amplitude: 0.625 V
Lead Channel Pacing Threshold Pulse Width: 0.4 ms
Lead Channel Pacing Threshold Pulse Width: 0.4 ms
Lead Channel Sensing Intrinsic Amplitude: 1.75 mV
Lead Channel Sensing Intrinsic Amplitude: 1.75 mV
Lead Channel Sensing Intrinsic Amplitude: 10.125 mV
Lead Channel Sensing Intrinsic Amplitude: 10.125 mV
Lead Channel Setting Pacing Amplitude: 2 V
Lead Channel Setting Pacing Amplitude: 2.5 V
Lead Channel Setting Pacing Pulse Width: 0.4 ms
Lead Channel Setting Sensing Sensitivity: 0.9 mV

## 2021-07-01 NOTE — Progress Notes (Signed)
Remote pacemaker transmission.   

## 2021-07-05 DIAGNOSIS — M159 Polyosteoarthritis, unspecified: Secondary | ICD-10-CM | POA: Diagnosis not present

## 2021-07-05 DIAGNOSIS — Z299 Encounter for prophylactic measures, unspecified: Secondary | ICD-10-CM | POA: Diagnosis not present

## 2021-07-05 DIAGNOSIS — G4730 Sleep apnea, unspecified: Secondary | ICD-10-CM | POA: Diagnosis not present

## 2021-07-05 DIAGNOSIS — I10 Essential (primary) hypertension: Secondary | ICD-10-CM | POA: Diagnosis not present

## 2021-07-15 DIAGNOSIS — I10 Essential (primary) hypertension: Secondary | ICD-10-CM | POA: Diagnosis not present

## 2021-08-15 DIAGNOSIS — I10 Essential (primary) hypertension: Secondary | ICD-10-CM | POA: Diagnosis not present

## 2021-08-15 DIAGNOSIS — G4700 Insomnia, unspecified: Secondary | ICD-10-CM | POA: Diagnosis not present

## 2021-08-17 DIAGNOSIS — R21 Rash and other nonspecific skin eruption: Secondary | ICD-10-CM | POA: Diagnosis not present

## 2021-08-17 DIAGNOSIS — Z299 Encounter for prophylactic measures, unspecified: Secondary | ICD-10-CM | POA: Diagnosis not present

## 2021-08-17 DIAGNOSIS — I10 Essential (primary) hypertension: Secondary | ICD-10-CM | POA: Diagnosis not present

## 2021-08-17 DIAGNOSIS — R0600 Dyspnea, unspecified: Secondary | ICD-10-CM | POA: Diagnosis not present

## 2021-08-17 DIAGNOSIS — G4700 Insomnia, unspecified: Secondary | ICD-10-CM | POA: Diagnosis not present

## 2021-08-31 DIAGNOSIS — I7300 Raynaud's syndrome without gangrene: Secondary | ICD-10-CM | POA: Diagnosis not present

## 2021-08-31 DIAGNOSIS — M351 Other overlap syndromes: Secondary | ICD-10-CM | POA: Diagnosis not present

## 2021-08-31 DIAGNOSIS — M6530 Trigger finger, unspecified finger: Secondary | ICD-10-CM | POA: Diagnosis not present

## 2021-08-31 DIAGNOSIS — R001 Bradycardia, unspecified: Secondary | ICD-10-CM | POA: Diagnosis not present

## 2021-08-31 DIAGNOSIS — M2550 Pain in unspecified joint: Secondary | ICD-10-CM | POA: Diagnosis not present

## 2021-08-31 DIAGNOSIS — M797 Fibromyalgia: Secondary | ICD-10-CM | POA: Diagnosis not present

## 2021-08-31 DIAGNOSIS — M150 Primary generalized (osteo)arthritis: Secondary | ICD-10-CM | POA: Diagnosis not present

## 2021-08-31 DIAGNOSIS — Z6823 Body mass index (BMI) 23.0-23.9, adult: Secondary | ICD-10-CM | POA: Diagnosis not present

## 2021-08-31 DIAGNOSIS — M3500 Sicca syndrome, unspecified: Secondary | ICD-10-CM | POA: Diagnosis not present

## 2021-09-01 DIAGNOSIS — G4730 Sleep apnea, unspecified: Secondary | ICD-10-CM | POA: Diagnosis not present

## 2021-09-14 DIAGNOSIS — I10 Essential (primary) hypertension: Secondary | ICD-10-CM | POA: Diagnosis not present

## 2021-09-15 DIAGNOSIS — H2513 Age-related nuclear cataract, bilateral: Secondary | ICD-10-CM | POA: Diagnosis not present

## 2021-09-15 DIAGNOSIS — H40033 Anatomical narrow angle, bilateral: Secondary | ICD-10-CM | POA: Diagnosis not present

## 2021-09-17 ENCOUNTER — Other Ambulatory Visit: Payer: Self-pay | Admitting: Internal Medicine

## 2021-09-19 NOTE — Telephone Encounter (Signed)
This is a [facility] pt °

## 2021-09-22 ENCOUNTER — Ambulatory Visit (INDEPENDENT_AMBULATORY_CARE_PROVIDER_SITE_OTHER): Payer: Medicare Other

## 2021-09-22 DIAGNOSIS — I441 Atrioventricular block, second degree: Secondary | ICD-10-CM

## 2021-09-22 LAB — CUP PACEART REMOTE DEVICE CHECK
Battery Remaining Longevity: 127 mo
Battery Voltage: 3.04 V
Brady Statistic AP VP Percent: 33.36 %
Brady Statistic AP VS Percent: 2.95 %
Brady Statistic AS VP Percent: 6.89 %
Brady Statistic AS VS Percent: 56.79 %
Brady Statistic RA Percent Paced: 36.31 %
Brady Statistic RV Percent Paced: 40.25 %
Date Time Interrogation Session: 20221207220138
Implantable Lead Implant Date: 20210909
Implantable Lead Implant Date: 20210909
Implantable Lead Location: 753859
Implantable Lead Location: 753860
Implantable Lead Model: 3830
Implantable Lead Model: 5076
Implantable Pulse Generator Implant Date: 20210909
Lead Channel Impedance Value: 323 Ohm
Lead Channel Impedance Value: 323 Ohm
Lead Channel Impedance Value: 361 Ohm
Lead Channel Impedance Value: 437 Ohm
Lead Channel Pacing Threshold Amplitude: 0.5 V
Lead Channel Pacing Threshold Amplitude: 0.5 V
Lead Channel Pacing Threshold Pulse Width: 0.4 ms
Lead Channel Pacing Threshold Pulse Width: 0.4 ms
Lead Channel Sensing Intrinsic Amplitude: 1.375 mV
Lead Channel Sensing Intrinsic Amplitude: 1.375 mV
Lead Channel Sensing Intrinsic Amplitude: 8.625 mV
Lead Channel Sensing Intrinsic Amplitude: 8.625 mV
Lead Channel Setting Pacing Amplitude: 2 V
Lead Channel Setting Pacing Amplitude: 2.5 V
Lead Channel Setting Pacing Pulse Width: 0.4 ms
Lead Channel Setting Sensing Sensitivity: 0.9 mV

## 2021-09-30 NOTE — Progress Notes (Signed)
Remote pacemaker transmission.   

## 2021-10-01 DIAGNOSIS — G4730 Sleep apnea, unspecified: Secondary | ICD-10-CM | POA: Diagnosis not present

## 2021-10-14 DIAGNOSIS — I10 Essential (primary) hypertension: Secondary | ICD-10-CM | POA: Diagnosis not present

## 2021-10-21 ENCOUNTER — Ambulatory Visit (INDEPENDENT_AMBULATORY_CARE_PROVIDER_SITE_OTHER): Payer: Medicare Other | Admitting: Internal Medicine

## 2021-10-21 ENCOUNTER — Encounter: Payer: Self-pay | Admitting: Internal Medicine

## 2021-10-21 VITALS — BP 151/109 | HR 77 | Ht 63.0 in | Wt 134.0 lb

## 2021-10-21 DIAGNOSIS — I10 Essential (primary) hypertension: Secondary | ICD-10-CM

## 2021-10-21 DIAGNOSIS — I441 Atrioventricular block, second degree: Secondary | ICD-10-CM

## 2021-10-21 DIAGNOSIS — G4733 Obstructive sleep apnea (adult) (pediatric): Secondary | ICD-10-CM

## 2021-10-21 MED ORDER — VALSARTAN-HYDROCHLOROTHIAZIDE 160-25 MG PO TABS: 1.0000 | ORAL_TABLET | Freq: Every day | ORAL | 6 refills | Status: DC

## 2021-10-21 MED ORDER — VALSARTAN-HYDROCHLOROTHIAZIDE 160-25 MG PO TABS: 1.0000 | ORAL_TABLET | Freq: Every day | ORAL | 3 refills | Status: DC

## 2021-10-21 NOTE — Patient Instructions (Signed)
Medication Instructions:  Increase Diovan / HCTZ to 160/25mg  daily. Continue all other medications.     Labwork: none  Testing/Procedures: none  Follow-Up: 1 year - Dr.  Rayann Heman   Any Other Special Instructions Will Be Listed Below (If Applicable).   If you need a refill on your cardiac medications before your next appointment, please call your pharmacy.

## 2021-10-21 NOTE — Progress Notes (Signed)
PCP: Glenda Chroman, MD Primary Cardiologist: Dr Harl Bowie Primary EP:  Dr Rayann Heman  Susan Hicks is a 76 y.o. female who presents today for routine electrophysiology followup.  Since last being seen in our clinic, the patient reports doing very well.  Today, she denies symptoms of palpitations, chest pain, shortness of breath,  lower extremity edema, dizziness, presyncope, or syncope.  The patient is otherwise without complaint today.   Past Medical History:  Diagnosis Date   Depression    Fibromyalgia    now considered mixed connective tissue disorder   Generalized anxiety disorder 08/21/2018   GERD (gastroesophageal reflux disease)    Hypertension    Irritable bowel    Junctional bradycardia    a. 2017 - beta blocker discontinued.   LBBB (left bundle branch block)    Mixed connective tissue disease ([place])    Obstructive sleep apnea treated with continuous positive airway pressure (CPAP) 08/21/2018   Osteoporosis    Premature atrial contractions    Primary localized osteoarthritis of right knee    Past Surgical History:  Procedure Laterality Date   BACK SURGERY     CHOLECYSTECTOMY     HAND SURGERY  2006   PACEMAKER IMPLANT N/A 06/24/2020   Procedure: PACEMAKER IMPLANT;  Surgeon: Thompson Grayer, MD;  Location: [facility];  Service: Cardiovascular;  Laterality: N/A;   TOTAL KNEE ARTHROPLASTY Right 09/02/2018   Procedure: TOTAL KNEE ARTHROPLASTY;  Surgeon: Elsie Saas, MD;  Location: [facility];  Service: Orthopedics;  Laterality: Right;    ROS- all systems are reviewed and negative except as per HPI above  Current Outpatient Medications  Medication Sig Dispense Refill   acetaminophen (TYLENOL) 500 MG tablet Take 500 mg by mouth at bedtime as needed for moderate pain.      Artificial Tear Solution (SOOTHE XP OP) Place 1 drop into both eyes in the morning and at bedtime.     b complex vitamins tablet Take 1 tablet by mouth daily.     busPIRone (BUSPAR) 15 MG tablet Take  15 mg by mouth 2 (two) times daily.     Calcium Carb-Cholecalciferol (CALCIUM PLUS VITAMIN D3) 600-500 MG-UNIT CAPS Take 1 tablet by mouth daily.     cetirizine (ZYRTEC) 10 MG tablet Take 10 mg by mouth daily.     chlorpheniramine (CHLOR-TRIMETON) 4 MG tablet Take 4 mg by mouth at bedtime as needed for allergies.     Cholecalciferol (VITAMIN D3) 2000 units TABS Take 2,000 Units by mouth daily.      diphenhydramine-acetaminophen (TYLENOL PM) 25-500 MG TABS tablet Take 1 tablet by mouth at bedtime.      leflunomide (ARAVA) 20 MG tablet Take 20 mg by mouth daily.     LORazepam (ATIVAN) 0.5 MG tablet Take 0.5 mg by mouth at bedtime.     Magnesium 250 MG TABS Take 250 mg by mouth at bedtime.     melatonin 5 MG TABS Take 5 mg by mouth at bedtime.     meloxicam (MOBIC) 7.5 MG tablet Take 7.5 mg by mouth in the morning and at bedtime.     metoprolol succinate (TOPROL-XL) 25 MG 24 hr tablet TAKE 1 TABLET BY MOUTH DAILY 90 tablet 3   Multiple Vitamins-Minerals (ADULT GUMMY) CHEW Chew 2 tablets by mouth daily.     oxybutynin (DITROPAN) 5 MG tablet Take 5 mg by mouth daily.     pantoprazole (PROTONIX) 40 MG tablet Take 40 mg by mouth daily.  PRESCRIPTION MEDICATION Inhale into the lungs at bedtime. CPAP     Psyllium (METAMUCIL FIBER PO) Take 1 Dose by mouth daily.     sertraline (ZOLOFT) 100 MG tablet Take 150 mg by mouth daily.     traMADol (ULTRAM) 50 MG tablet Take 50 mg by mouth every 6 (six) hours as needed for moderate pain (hip / back pain).     Turmeric Curcumin 500 MG CAPS Take 500 mg by mouth at bedtime.     valsartan-hydrochlorothiazide (DIOVAN-HCT) 160-25 MG tablet Take 1 tablet by mouth daily.     No current facility-administered medications for this visit.    Physical Exam: Vitals:   10/21/21 1202  Weight: 134 lb (60.8 kg)  Height: 5\' 3"  (1.6 m)    GEN- The patient is well appearing, alert and oriented x 3 today.   Head- normocephalic, atraumatic Eyes-  Sclera clear,  conjunctiva pink Ears- hearing intact Oropharynx- clear Lungs- Clear to ausculation bilaterally, normal work of breathing Chest- pacemaker pocket is well healed Heart- Regular rate and rhythm, no murmurs, rubs or gallops, PMI not laterally displaced GI- soft, NT, ND, + BS Extremities- no clubbing, cyanosis, or edema  Pacemaker interrogation- reviewed in detail today,  See PACEART report  ekg tracing ordered today is personally reviewed and shows sinus  Assessment and Plan:  1. 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 HTN Elevated Increase Diovan-HCT to 160/80mg  daily I have advised that she follow-up with PCP for BMET  3. OSA Compliance with therapy is advised  Return in a year  Thompson Grayer MD, [facility] 10/21/2021 12:11 PM

## 2021-11-01 DIAGNOSIS — G4730 Sleep apnea, unspecified: Secondary | ICD-10-CM | POA: Diagnosis not present

## 2021-11-13 DIAGNOSIS — I10 Essential (primary) hypertension: Secondary | ICD-10-CM | POA: Diagnosis not present

## 2021-11-17 DIAGNOSIS — G4730 Sleep apnea, unspecified: Secondary | ICD-10-CM | POA: Diagnosis not present

## 2021-11-17 DIAGNOSIS — Z299 Encounter for prophylactic measures, unspecified: Secondary | ICD-10-CM | POA: Diagnosis not present

## 2021-11-17 DIAGNOSIS — I10 Essential (primary) hypertension: Secondary | ICD-10-CM | POA: Diagnosis not present

## 2021-11-17 DIAGNOSIS — M351 Other overlap syndromes: Secondary | ICD-10-CM | POA: Diagnosis not present

## 2021-11-30 DIAGNOSIS — G4730 Sleep apnea, unspecified: Secondary | ICD-10-CM | POA: Diagnosis not present

## 2021-11-30 DIAGNOSIS — G4733 Obstructive sleep apnea (adult) (pediatric): Secondary | ICD-10-CM | POA: Diagnosis not present

## 2021-12-02 DIAGNOSIS — G4730 Sleep apnea, unspecified: Secondary | ICD-10-CM | POA: Diagnosis not present

## 2021-12-12 DIAGNOSIS — Z79899 Other long term (current) drug therapy: Secondary | ICD-10-CM | POA: Diagnosis not present

## 2021-12-13 DIAGNOSIS — I10 Essential (primary) hypertension: Secondary | ICD-10-CM | POA: Diagnosis not present

## 2021-12-22 ENCOUNTER — Ambulatory Visit (INDEPENDENT_AMBULATORY_CARE_PROVIDER_SITE_OTHER): Payer: Medicare Other

## 2021-12-22 DIAGNOSIS — I441 Atrioventricular block, second degree: Secondary | ICD-10-CM | POA: Diagnosis not present

## 2021-12-22 LAB — CUP PACEART REMOTE DEVICE CHECK
Battery Remaining Longevity: 126 mo
Battery Voltage: 3.03 V
Brady Statistic AP VP Percent: 25.34 %
Brady Statistic AP VS Percent: 5.24 %
Brady Statistic AS VP Percent: 2.68 %
Brady Statistic AS VS Percent: 66.75 %
Brady Statistic RA Percent Paced: 30.63 %
Brady Statistic RV Percent Paced: 28.02 %
Date Time Interrogation Session: 20230309055757
Implantable Lead Implant Date: 20210909
Implantable Lead Implant Date: 20210909
Implantable Lead Location: 753859
Implantable Lead Location: 753860
Implantable Lead Model: 3830
Implantable Lead Model: 5076
Implantable Pulse Generator Implant Date: 20210909
Lead Channel Impedance Value: 304 Ohm
Lead Channel Impedance Value: 342 Ohm
Lead Channel Impedance Value: 380 Ohm
Lead Channel Impedance Value: 437 Ohm
Lead Channel Pacing Threshold Amplitude: 0.5 V
Lead Channel Pacing Threshold Amplitude: 0.625 V
Lead Channel Pacing Threshold Pulse Width: 0.4 ms
Lead Channel Pacing Threshold Pulse Width: 0.4 ms
Lead Channel Sensing Intrinsic Amplitude: 1.125 mV
Lead Channel Sensing Intrinsic Amplitude: 1.125 mV
Lead Channel Sensing Intrinsic Amplitude: 9.375 mV
Lead Channel Sensing Intrinsic Amplitude: 9.375 mV
Lead Channel Setting Pacing Amplitude: 2 V
Lead Channel Setting Pacing Amplitude: 2.5 V
Lead Channel Setting Pacing Pulse Width: 0.4 ms
Lead Channel Setting Sensing Sensitivity: 0.9 mV

## 2021-12-30 DIAGNOSIS — G4730 Sleep apnea, unspecified: Secondary | ICD-10-CM | POA: Diagnosis not present

## 2022-01-04 NOTE — Progress Notes (Signed)
Remote pacemaker transmission.   

## 2022-01-05 DIAGNOSIS — Z0000 Encounter for general adult medical examination without abnormal findings: Secondary | ICD-10-CM | POA: Diagnosis not present

## 2022-01-05 DIAGNOSIS — I10 Essential (primary) hypertension: Secondary | ICD-10-CM | POA: Diagnosis not present

## 2022-01-05 DIAGNOSIS — Z7189 Other specified counseling: Secondary | ICD-10-CM | POA: Diagnosis not present

## 2022-01-05 DIAGNOSIS — Z299 Encounter for prophylactic measures, unspecified: Secondary | ICD-10-CM | POA: Diagnosis not present

## 2022-01-12 DIAGNOSIS — I10 Essential (primary) hypertension: Secondary | ICD-10-CM | POA: Diagnosis not present

## 2022-01-30 DIAGNOSIS — G4730 Sleep apnea, unspecified: Secondary | ICD-10-CM | POA: Diagnosis not present

## 2022-02-10 DIAGNOSIS — I10 Essential (primary) hypertension: Secondary | ICD-10-CM | POA: Diagnosis not present

## 2022-02-10 DIAGNOSIS — Z299 Encounter for prophylactic measures, unspecified: Secondary | ICD-10-CM | POA: Diagnosis not present

## 2022-02-10 DIAGNOSIS — J069 Acute upper respiratory infection, unspecified: Secondary | ICD-10-CM | POA: Diagnosis not present

## 2022-02-10 DIAGNOSIS — Z789 Other specified health status: Secondary | ICD-10-CM | POA: Diagnosis not present

## 2022-02-12 DIAGNOSIS — I10 Essential (primary) hypertension: Secondary | ICD-10-CM | POA: Diagnosis not present

## 2022-02-16 ENCOUNTER — Other Ambulatory Visit: Payer: Self-pay | Admitting: Internal Medicine

## 2022-02-16 MED ORDER — VALSARTAN-HYDROCHLOROTHIAZIDE 160-25 MG PO TABS: 1.0000 | ORAL_TABLET | Freq: Every day | ORAL | 2 refills | Status: DC

## 2022-02-17 ENCOUNTER — Ambulatory Visit: Payer: Medicare Other | Admitting: Cardiology

## 2022-02-23 DIAGNOSIS — I10 Essential (primary) hypertension: Secondary | ICD-10-CM | POA: Diagnosis not present

## 2022-02-23 DIAGNOSIS — Z0000 Encounter for general adult medical examination without abnormal findings: Secondary | ICD-10-CM | POA: Diagnosis not present

## 2022-02-23 DIAGNOSIS — Z299 Encounter for prophylactic measures, unspecified: Secondary | ICD-10-CM | POA: Diagnosis not present

## 2022-02-23 DIAGNOSIS — E7800 Pure hypercholesterolemia, unspecified: Secondary | ICD-10-CM | POA: Diagnosis not present

## 2022-02-23 DIAGNOSIS — Z789 Other specified health status: Secondary | ICD-10-CM | POA: Diagnosis not present

## 2022-02-23 DIAGNOSIS — Z79899 Other long term (current) drug therapy: Secondary | ICD-10-CM | POA: Diagnosis not present

## 2022-02-23 DIAGNOSIS — R5383 Other fatigue: Secondary | ICD-10-CM | POA: Diagnosis not present

## 2022-02-23 DIAGNOSIS — M8580 Other specified disorders of bone density and structure, unspecified site: Secondary | ICD-10-CM | POA: Diagnosis not present

## 2022-02-28 DIAGNOSIS — Z6824 Body mass index (BMI) 24.0-24.9, adult: Secondary | ICD-10-CM | POA: Diagnosis not present

## 2022-02-28 DIAGNOSIS — I7300 Raynaud's syndrome without gangrene: Secondary | ICD-10-CM | POA: Diagnosis not present

## 2022-02-28 DIAGNOSIS — M797 Fibromyalgia: Secondary | ICD-10-CM | POA: Diagnosis not present

## 2022-02-28 DIAGNOSIS — M351 Other overlap syndromes: Secondary | ICD-10-CM | POA: Diagnosis not present

## 2022-02-28 DIAGNOSIS — M6530 Trigger finger, unspecified finger: Secondary | ICD-10-CM | POA: Diagnosis not present

## 2022-02-28 DIAGNOSIS — M3500 Sicca syndrome, unspecified: Secondary | ICD-10-CM | POA: Diagnosis not present

## 2022-02-28 DIAGNOSIS — M2550 Pain in unspecified joint: Secondary | ICD-10-CM | POA: Diagnosis not present

## 2022-03-01 DIAGNOSIS — G4730 Sleep apnea, unspecified: Secondary | ICD-10-CM | POA: Diagnosis not present

## 2022-03-03 DIAGNOSIS — J069 Acute upper respiratory infection, unspecified: Secondary | ICD-10-CM | POA: Diagnosis not present

## 2022-03-03 DIAGNOSIS — Z299 Encounter for prophylactic measures, unspecified: Secondary | ICD-10-CM | POA: Diagnosis not present

## 2022-03-06 IMAGING — MR MR LUMBAR SPINE W/O CM
4 of 5 series · 13 of 48 positions shown · non-contrast
Comparison: Lumbar radiographs 03/29/2018

CLINICAL DATA: Low back pain with right hip pain 3 years

EXAM:
MRI LUMBAR SPINE WITHOUT CONTRAST
TECHNIQUE: Multiplanar, multisequence MR imaging of the lumbar spine was
performed. No intravenous contrast was administered.

[Series 3: T2 · sagittal · 4.0mm · 0.35mm/px · 4 of 19 slices shown (1 of 3)]
[im 1/19]
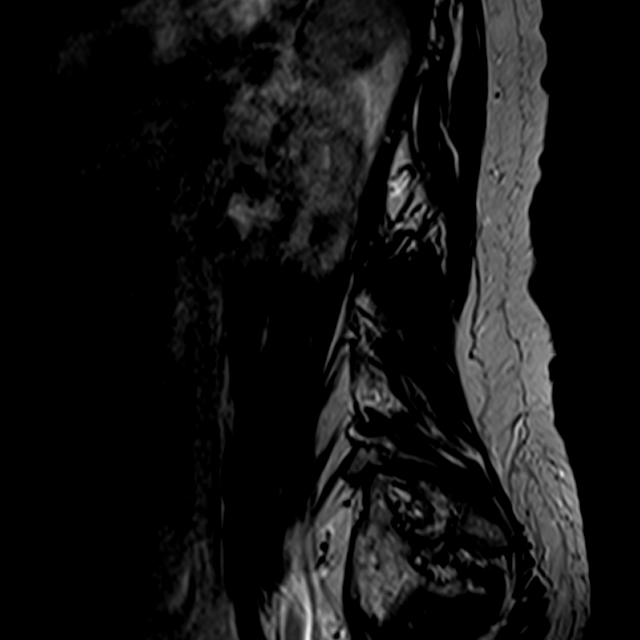
[im 4/19]
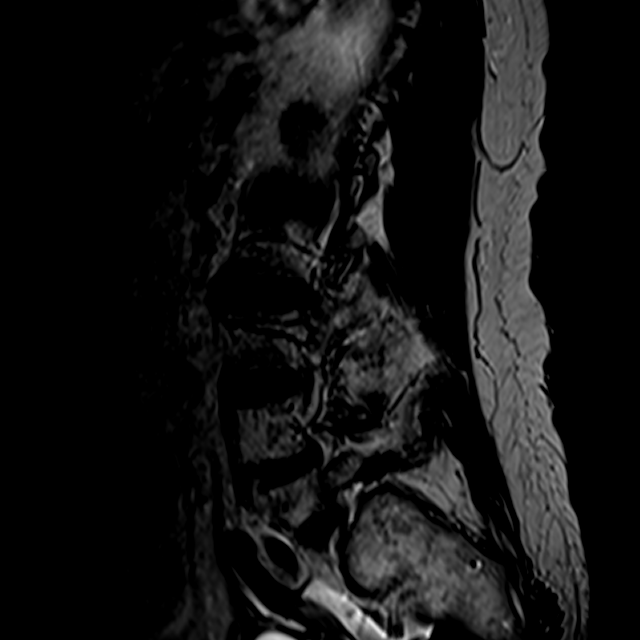
[im 11/19]
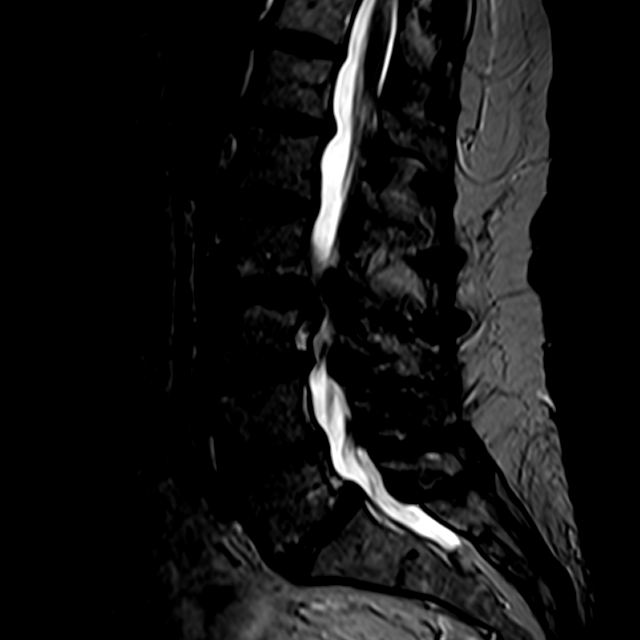
[im 19/19]
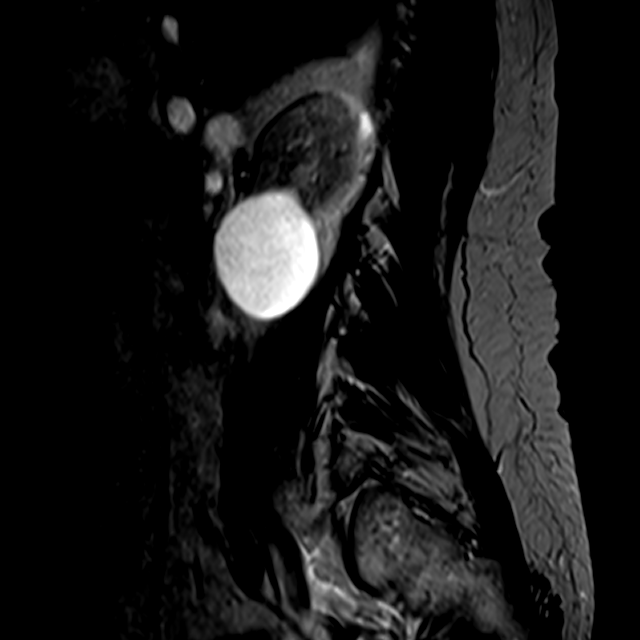

[Series 4: T1 · sagittal · 4.0mm · 0.35mm/px · 3 of 19 slices shown]
[im 4/19]
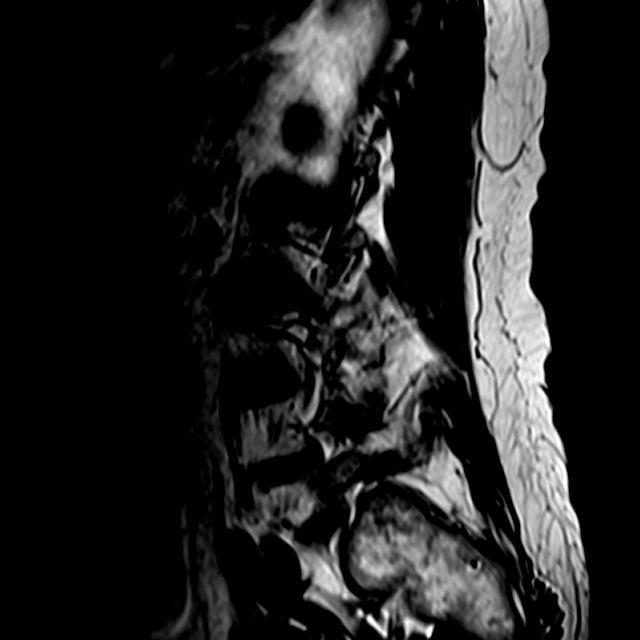
[im 10/19]
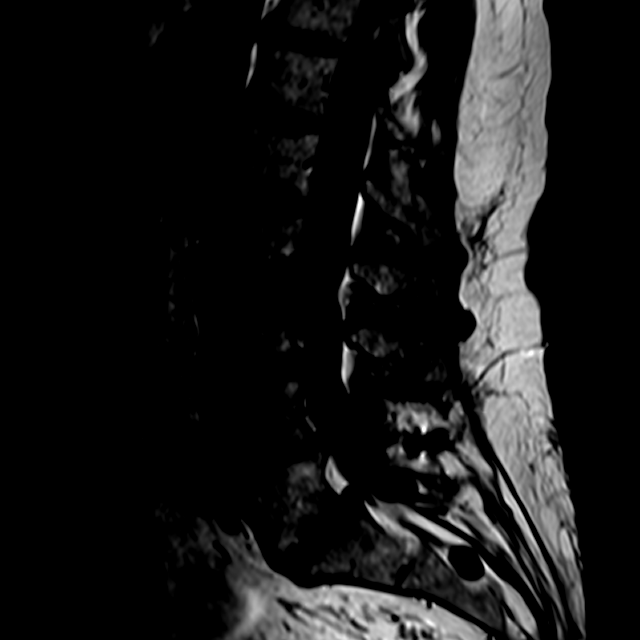
[im 16/19]
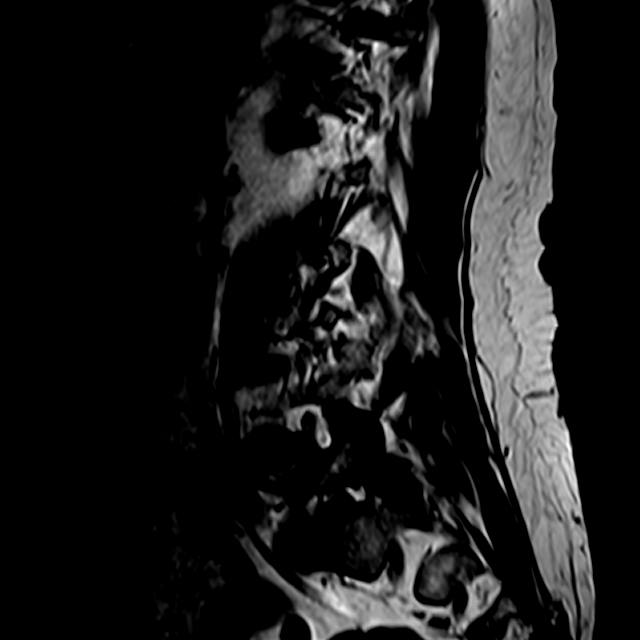

[Series 5: T2 · sagittal · 4.0mm · 0.35mm/px · 3 of 18 slices shown (2 of 3)]
[im 3/18]
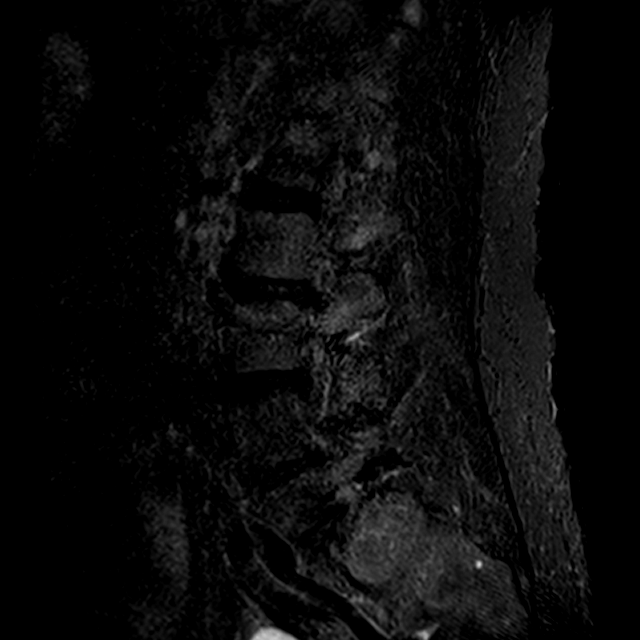
[im 9/18]
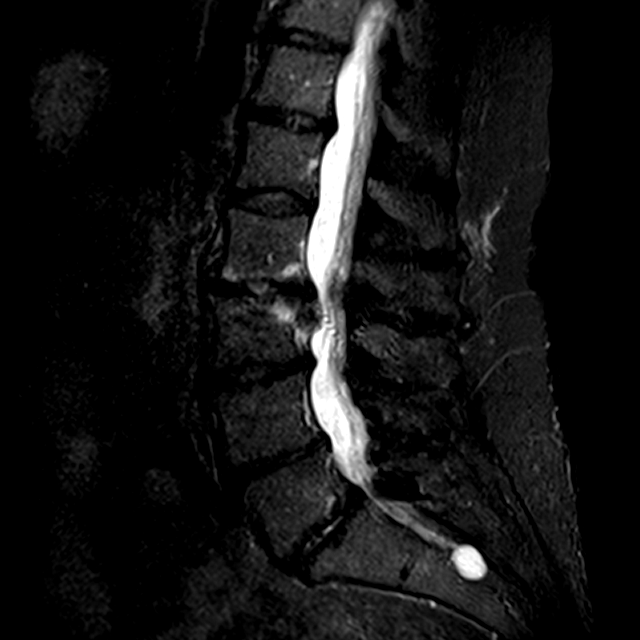
[im 15/18]
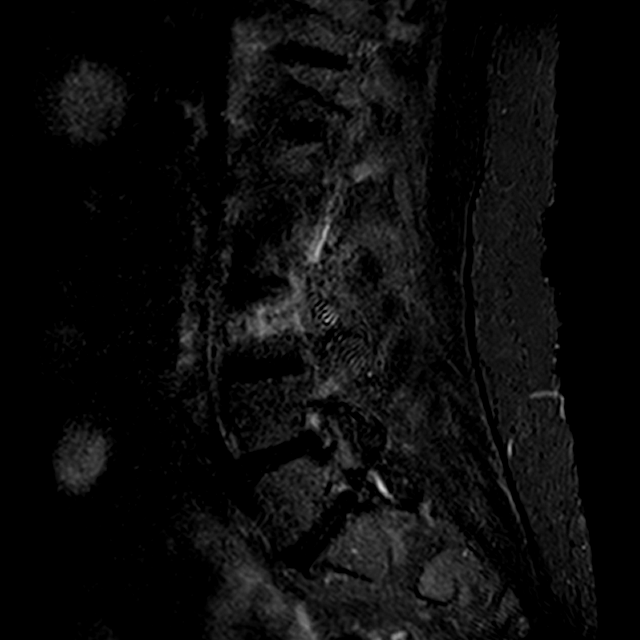

[Series 6: T2 · axial · 4.0mm · 0.26mm/px · z∈[-95,+30]mm · 3 of 36 slices shown (3 of 3)]
[im 6/36]
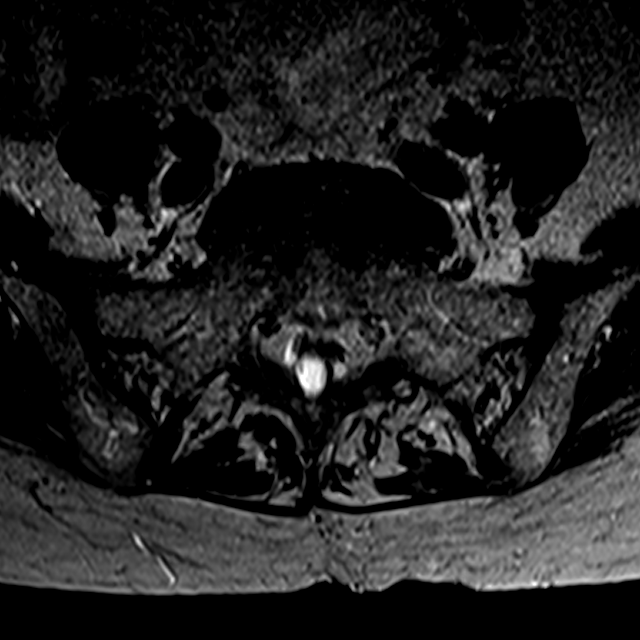
[im 19/36]
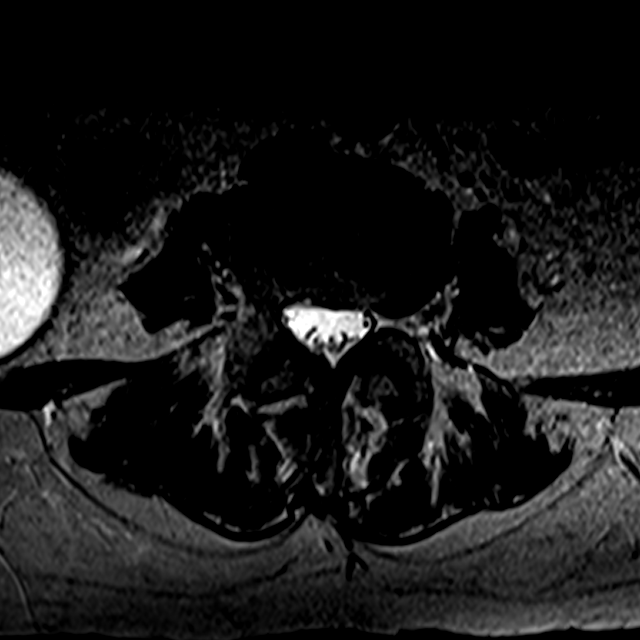
[im 30/36]
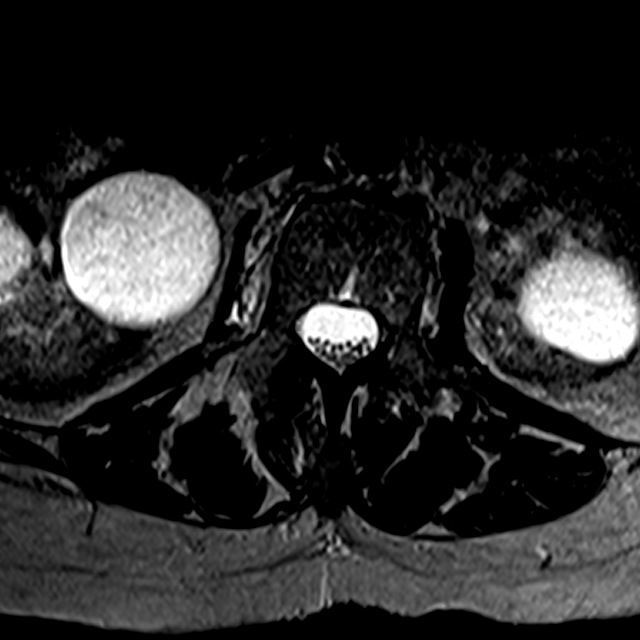

[13 of 48 positions shown; findings below may reference images not displayed]

FINDINGS: Segmentation:  Normal

Alignment: Mild anterolisthesis L5-S1. Mild-to-moderate
levoscoliosis.

Vertebrae: Negative for fracture or mass. Disc degeneration and
endplate edema on the right at L2-3.

Conus medullaris and cauda equina: Conus extends to the T12-L1
level. Conus and cauda equina appear normal.

Paraspinal and other soft tissues: Bilateral renal cysts. No
paraspinous adenopathy.

Disc levels:

T12-L1: Central disc protrusion without stenosis

L1-2: Negative

L2-3: Severe asymmetric disc degeneration on the right with disc
space narrowing and endplate edema. Large extruded disc fragment on
the right with downgoing disc material. There is impingement of the
right L3 nerve root. Diffuse endplate spurring and bilateral facet
hypertrophy. Mild spinal stenosis.

L3-4: Shallow central disc protrusion. Diffuse endplate spurring and
bilateral facet degeneration. Mild subarticular stenosis
bilaterally. Spinal canal adequate in size.

L4-5: Left laminectomy. Bilateral facet degeneration left greater
than right. Diffuse endplate spurring on the left with moderate
subarticular and foraminal stenosis on the left.

L5-S1: Grade 1 anterolisthesis. Disc bulging and diffuse endplate
spurring with severe facet hypertrophy. Moderate spinal stenosis.
Severe subarticular and foraminal stenosis with bilateral L5 and S1
nerve root impingement.
IMPRESSION: Large extruded disc fragment on the right at L2-3 with right L3
nerve root impingement.

Mild subarticular stenosis bilaterally L3-4

Left laminectomy L4-5. Moderate subarticular and foraminal stenosis
on the left due to spurring

Severe subarticular and foraminal stenosis bilaterally at L5-S1 with
expected L5 and S1 nerve root impingement bilaterally

## 2022-03-10 DIAGNOSIS — I10 Essential (primary) hypertension: Secondary | ICD-10-CM | POA: Diagnosis not present

## 2022-03-10 DIAGNOSIS — B379 Candidiasis, unspecified: Secondary | ICD-10-CM | POA: Diagnosis not present

## 2022-03-10 DIAGNOSIS — H6123 Impacted cerumen, bilateral: Secondary | ICD-10-CM | POA: Diagnosis not present

## 2022-03-10 DIAGNOSIS — Z299 Encounter for prophylactic measures, unspecified: Secondary | ICD-10-CM | POA: Diagnosis not present

## 2022-03-14 DIAGNOSIS — I10 Essential (primary) hypertension: Secondary | ICD-10-CM | POA: Diagnosis not present

## 2022-03-23 ENCOUNTER — Ambulatory Visit (INDEPENDENT_AMBULATORY_CARE_PROVIDER_SITE_OTHER): Payer: Medicare Other

## 2022-03-23 DIAGNOSIS — I441 Atrioventricular block, second degree: Secondary | ICD-10-CM

## 2022-03-23 LAB — CUP PACEART REMOTE DEVICE CHECK
Battery Remaining Longevity: 125 mo
Battery Voltage: 3.02 V
Brady Statistic AP VP Percent: 31.02 %
Brady Statistic AP VS Percent: 4.65 %
Brady Statistic AS VP Percent: 1.7 %
Brady Statistic AS VS Percent: 62.63 %
Brady Statistic RA Percent Paced: 35.77 %
Brady Statistic RV Percent Paced: 32.71 %
Date Time Interrogation Session: 20230608064936
Implantable Lead Implant Date: 20210909
Implantable Lead Implant Date: 20210909
Implantable Lead Location: 753859
Implantable Lead Location: 753860
Implantable Lead Model: 3830
Implantable Lead Model: 5076
Implantable Pulse Generator Implant Date: 20210909
Lead Channel Impedance Value: 323 Ohm
Lead Channel Impedance Value: 342 Ohm
Lead Channel Impedance Value: 361 Ohm
Lead Channel Impedance Value: 418 Ohm
Lead Channel Pacing Threshold Amplitude: 0.5 V
Lead Channel Pacing Threshold Amplitude: 0.5 V
Lead Channel Pacing Threshold Pulse Width: 0.4 ms
Lead Channel Pacing Threshold Pulse Width: 0.4 ms
Lead Channel Sensing Intrinsic Amplitude: 1.5 mV
Lead Channel Sensing Intrinsic Amplitude: 1.5 mV
Lead Channel Sensing Intrinsic Amplitude: 10.125 mV
Lead Channel Sensing Intrinsic Amplitude: 10.125 mV
Lead Channel Setting Pacing Amplitude: 2 V
Lead Channel Setting Pacing Amplitude: 2.5 V
Lead Channel Setting Pacing Pulse Width: 0.4 ms
Lead Channel Setting Sensing Sensitivity: 0.9 mV

## 2022-03-28 ENCOUNTER — Ambulatory Visit: Payer: Medicare Other | Admitting: Cardiology

## 2022-03-28 ENCOUNTER — Encounter: Payer: Self-pay | Admitting: Cardiology

## 2022-03-28 ENCOUNTER — Encounter: Payer: Self-pay | Admitting: *Deleted

## 2022-03-28 VITALS — BP 118/70 | HR 67 | Ht 63.0 in | Wt 137.2 lb

## 2022-03-28 DIAGNOSIS — R001 Bradycardia, unspecified: Secondary | ICD-10-CM | POA: Diagnosis not present

## 2022-03-28 DIAGNOSIS — I10 Essential (primary) hypertension: Secondary | ICD-10-CM

## 2022-03-28 NOTE — Patient Instructions (Signed)

## 2022-03-28 NOTE — Progress Notes (Signed)
Clinical Summary Susan Hicks is a 76 y.o.female seen today for follow up of the following medical problems.       1. 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      2. OSA - sleeps with OSA - not interested in establishing with sleep medicine.    3. HTN - home bp's typcially around 110s-120s/60s-80s    4. Hyperlipidemia - not in favor of statins.    Has 4 granddaughters, oldest 25,19,9,7 Past Medical History:  Diagnosis Date   Depression    Fibromyalgia    now considered mixed connective tissue disorder   Generalized anxiety disorder 08/21/2018   GERD (gastroesophageal reflux disease)    Hypertension    Irritable bowel    Junctional bradycardia    a. 2017 - beta blocker discontinued.   LBBB (left bundle [place] block)    Mixed connective tissue disease ([place])    Obstructive sleep apnea treated with continuous positive airway pressure (CPAP) 08/21/2018   Osteoporosis    Premature atrial contractions    Primary localized osteoarthritis of right knee      Allergies  Allergen Reactions   Lisinopril Cough   Beta Adrenergic Blockers Other (See Comments)    Bradycardia - bottomed out heart rate    Gluten Meal Diarrhea    cramps   Penicillins Hives   Norvasc [Amlodipine] Other (See Comments)    HEADACHE      Current Outpatient Medications  Medication Sig Dispense Refill   acetaminophen (TYLENOL) 500 MG tablet Take 500 mg by mouth at bedtime as needed for moderate pain.      Artificial Tear Solution (SOOTHE XP OP) Place 1 drop into both eyes in the morning and at bedtime.     b complex vitamins tablet Take 1 tablet by mouth daily.     busPIRone (BUSPAR) 15 MG tablet Take 15 mg by mouth 2 (two) times daily.     Calcium Carb-Cholecalciferol (CALCIUM PLUS VITAMIN D3) 600-500 MG-UNIT CAPS Take 1 tablet by mouth daily.     cetirizine (ZYRTEC) 10 MG tablet Take 10 mg by mouth daily.      chlorpheniramine (CHLOR-TRIMETON) 4 MG tablet Take 4 mg by mouth at bedtime as needed for allergies.     Cholecalciferol (VITAMIN D3) 2000 units TABS Take 2,000 Units by mouth daily.      cycloSPORINE (RESTASIS) 0.05 % ophthalmic emulsion 1 drop 2 (two) times daily.     diphenhydramine-acetaminophen (TYLENOL PM) 25-500 MG TABS tablet Take 1 tablet by mouth at bedtime.      escitalopram (LEXAPRO) 20 MG tablet Take 20 mg by mouth daily.     leflunomide (ARAVA) 20 MG tablet Take 20 mg by mouth daily.     LORazepam (ATIVAN) 0.5 MG tablet Take 0.5 mg by mouth at bedtime.     Magnesium 250 MG TABS Take 250 mg by mouth at bedtime.     melatonin 5 MG TABS Take 5 mg by mouth at bedtime.     meloxicam (MOBIC) 7.5 MG tablet Take 7.5 mg by mouth in the morning and at bedtime.     metoprolol succinate (TOPROL-XL) 25 MG 24 hr tablet TAKE 1 TABLET BY MOUTH DAILY 90 tablet 3   Multiple Vitamins-Minerals (ADULT GUMMY) CHEW Chew 2 tablets by mouth daily.     oxybutynin (DITROPAN) 5 MG tablet Take 5 mg by mouth daily.     pantoprazole (PROTONIX) 40 MG tablet Take  40 mg by mouth daily.     PRESCRIPTION MEDICATION Inhale into the lungs at bedtime. CPAP     Psyllium (METAMUCIL FIBER PO) Take 1 Dose by mouth daily.     sertraline (ZOLOFT) 100 MG tablet Take 150 mg by mouth daily.     traMADol (ULTRAM) 50 MG tablet Take 50 mg by mouth every 6 (six) hours as needed for moderate pain (hip / back pain).     Turmeric Curcumin 500 MG CAPS Take 500 mg by mouth at bedtime.     valsartan-hydrochlorothiazide (DIOVAN HCT) 160-25 MG tablet Take 1 tablet by mouth daily. 90 tablet 2   No current facility-administered medications for this visit.     Past Surgical History:  Procedure Laterality Date   BACK SURGERY     CHOLECYSTECTOMY     HAND SURGERY  2006   PACEMAKER IMPLANT N/A 06/24/2020   Procedure: PACEMAKER IMPLANT;  Surgeon: Thompson Grayer, MD;  Location: [facility];  Service: Cardiovascular;  Laterality: N/A;    TOTAL KNEE ARTHROPLASTY Right 09/02/2018   Procedure: TOTAL KNEE ARTHROPLASTY;  Surgeon: Elsie Saas, MD;  Location: [facility];  Service: Orthopedics;  Laterality: Right;     Allergies  Allergen Reactions   Lisinopril Cough   Beta Adrenergic Blockers Other (See Comments)    Bradycardia - bottomed out heart rate    Gluten Meal Diarrhea    cramps   Penicillins Hives   Norvasc [Amlodipine] Other (See Comments)    HEADACHE       Family History  Problem Relation Age of Onset   Heart attack Father    Pulmonary disease Father    Hypertension Father    Thyroid disease Mother    Hypertension Mother    Thyroid disease Sister    Hypertension Sister    Hypertension Daughter    Hypertension Son      Social History Susan Hicks reports that she has never smoked. She has never used smokeless tobacco. Susan Hicks reports no history of alcohol use.   Review of Systems CONSTITUTIONAL: No weight loss, fever, chills, weakness or fatigue.  HEENT: Eyes: No visual loss, blurred vision, double vision or yellow sclerae.No hearing loss, sneezing, congestion, runny nose or sore throat.  SKIN: No rash or itching.  CARDIOVASCULAR: per hpi RESPIRATORY: No shortness of breath, cough or sputum.  GASTROINTESTINAL: No anorexia, nausea, vomiting or diarrhea. No abdominal pain or blood.  GENITOURINARY: No burning on urination, no polyuria NEUROLOGICAL: No headache, dizziness, syncope, paralysis, ataxia, numbness or tingling in the extremities. No change in bowel or bladder control.  MUSCULOSKELETAL: No muscle, back pain, joint pain or stiffness.  LYMPHATICS: No enlarged nodes. No history of splenectomy.  PSYCHIATRIC: No history of depression or anxiety.  ENDOCRINOLOGIC: No reports of sweating, cold or heat intolerance. No polyuria or polydipsia.  Marland Kitchen   Physical Examination Today's Vitals   03/28/22 1430  BP: 118/70  Pulse: 67  SpO2: 91%  Weight: 137 lb 3.2 oz (62.2 kg)  Height: '5\' 3"'$  (1.6  m)   Body mass index is 24.3 kg/m.  Gen: resting comfortably, no acute distress HEENT: no scleral icterus, pupils equal round and reactive, no palptable cervical adenopathy,  CV: RRR, no m/r/g no jvd Resp: Clear to auscultation bilaterally GI: abdomen is soft, non-tender, non-distended, normal bowel sounds, no hepatosplenomegaly MSK: extremities are warm, no edema.  Skin: warm, no rash Neuro:  no focal deficits Psych: appropriate affect   Diagnostic Studies  08/2018 echo Study Conclusions   -  Left ventricle: The cavity size was normal. Systolic function was   normal. Wall motion was normal; there were no regional wall   motion abnormalities. Doppler parameters are consistent with   abnormal left ventricular relaxation (grade 1 diastolic   dysfunction). - Aortic valve: There was no regurgitation. - Mitral valve: Calcified annulus. Mildly thickened leaflets .   There was mild regurgitation. - Right ventricle: The cavity size was normal. Wall thickness was   normal. Systolic function was normal. - Right atrium: The atrium was normal in size. - Tricuspid valve: There was mild regurgitation. - Pulmonary arteries: Systolic pressure was within the normal   range. - Inferior vena cava: The vessel was normal in size. - Pericardium, extracardiac: There was no pericardial effusion.    05/2020 echo 1. Left ventricular ejection fraction, by estimation, is 55 to 60%. The  left ventricle has normal function. The left ventricle has no regional  wall motion abnormalities. Left ventricular diastolic parameters are  consistent with Grade I diastolic  dysfunction (impaired relaxation). Elevated left ventricular end-diastolic  pressure. The average left ventricular global longitudinal strain is -22.9  %. The global longitudinal strain is normal.   2. Right ventricular systolic function is normal. The right ventricular  size is normal. There is normal pulmonary artery systolic pressure. The   estimated right ventricular systolic pressure is 48.5 mmHg.   3. The mitral valve is abnormal. Trivial mitral valve regurgitation.   4. The aortic valve is tricuspid. Aortic valve regurgitation is not  visualized. Mild aortic valve sclerosis is present, with no evidence of  aortic valve stenosis.   5. The inferior vena cava is normal in size with greater than 50%  respiratory variability, suggesting right atrial pressure of 3 mmHg.    Assessment and Plan   1.Bradycardia/pacemaker - no symptoms, recent normal device check - contineu to monitor  2. HTN - at goal, continue current meds     F/u 1 year       Arnoldo Lenis, M.D.

## 2022-03-31 NOTE — Progress Notes (Signed)
Remote pacemaker transmission.   

## 2022-04-01 DIAGNOSIS — G4737 Central sleep apnea in conditions classified elsewhere: Secondary | ICD-10-CM | POA: Diagnosis not present

## 2022-04-01 DIAGNOSIS — G4733 Obstructive sleep apnea (adult) (pediatric): Secondary | ICD-10-CM | POA: Diagnosis not present

## 2022-04-10 DIAGNOSIS — M859 Disorder of bone density and structure, unspecified: Secondary | ICD-10-CM | POA: Diagnosis not present

## 2022-04-10 DIAGNOSIS — Z79899 Other long term (current) drug therapy: Secondary | ICD-10-CM | POA: Diagnosis not present

## 2022-04-13 DIAGNOSIS — I10 Essential (primary) hypertension: Secondary | ICD-10-CM | POA: Diagnosis not present

## 2022-04-22 DIAGNOSIS — G4730 Sleep apnea, unspecified: Secondary | ICD-10-CM | POA: Diagnosis not present

## 2022-04-22 DIAGNOSIS — G4733 Obstructive sleep apnea (adult) (pediatric): Secondary | ICD-10-CM | POA: Diagnosis not present

## 2022-05-01 DIAGNOSIS — G4737 Central sleep apnea in conditions classified elsewhere: Secondary | ICD-10-CM | POA: Diagnosis not present

## 2022-05-01 DIAGNOSIS — G4733 Obstructive sleep apnea (adult) (pediatric): Secondary | ICD-10-CM | POA: Diagnosis not present

## 2022-05-02 ENCOUNTER — Other Ambulatory Visit: Payer: Self-pay | Admitting: Internal Medicine

## 2022-05-02 DIAGNOSIS — Z1231 Encounter for screening mammogram for malignant neoplasm of breast: Secondary | ICD-10-CM

## 2022-05-15 DIAGNOSIS — I10 Essential (primary) hypertension: Secondary | ICD-10-CM | POA: Diagnosis not present

## 2022-05-17 DIAGNOSIS — N281 Cyst of kidney, acquired: Secondary | ICD-10-CM | POA: Diagnosis not present

## 2022-05-17 DIAGNOSIS — R0602 Shortness of breath: Secondary | ICD-10-CM | POA: Diagnosis not present

## 2022-05-17 DIAGNOSIS — R079 Chest pain, unspecified: Secondary | ICD-10-CM | POA: Diagnosis not present

## 2022-05-17 DIAGNOSIS — R001 Bradycardia, unspecified: Secondary | ICD-10-CM | POA: Diagnosis not present

## 2022-05-17 DIAGNOSIS — I10 Essential (primary) hypertension: Secondary | ICD-10-CM | POA: Diagnosis not present

## 2022-05-17 DIAGNOSIS — I700 Atherosclerosis of aorta: Secondary | ICD-10-CM | POA: Diagnosis not present

## 2022-05-17 DIAGNOSIS — M4134 Thoracogenic scoliosis, thoracic region: Secondary | ICD-10-CM | POA: Diagnosis not present

## 2022-05-17 DIAGNOSIS — R000 Tachycardia, unspecified: Secondary | ICD-10-CM | POA: Diagnosis not present

## 2022-05-17 DIAGNOSIS — Z79899 Other long term (current) drug therapy: Secondary | ICD-10-CM | POA: Diagnosis not present

## 2022-05-17 DIAGNOSIS — R0789 Other chest pain: Secondary | ICD-10-CM | POA: Diagnosis not present

## 2022-05-17 DIAGNOSIS — Z950 Presence of cardiac pacemaker: Secondary | ICD-10-CM | POA: Diagnosis not present

## 2022-05-17 DIAGNOSIS — R519 Headache, unspecified: Secondary | ICD-10-CM | POA: Diagnosis not present

## 2022-05-17 DIAGNOSIS — I4510 Unspecified right bundle-branch block: Secondary | ICD-10-CM | POA: Diagnosis not present

## 2022-05-17 DIAGNOSIS — R9431 Abnormal electrocardiogram [ECG] [EKG]: Secondary | ICD-10-CM | POA: Diagnosis not present

## 2022-05-17 DIAGNOSIS — K219 Gastro-esophageal reflux disease without esophagitis: Secondary | ICD-10-CM | POA: Diagnosis not present

## 2022-05-18 ENCOUNTER — Telehealth: Payer: Self-pay | Admitting: Cardiology

## 2022-05-18 DIAGNOSIS — I10 Essential (primary) hypertension: Secondary | ICD-10-CM | POA: Diagnosis not present

## 2022-05-18 DIAGNOSIS — Z299 Encounter for prophylactic measures, unspecified: Secondary | ICD-10-CM | POA: Diagnosis not present

## 2022-05-18 DIAGNOSIS — M351 Other overlap syndromes: Secondary | ICD-10-CM | POA: Diagnosis not present

## 2022-05-18 NOTE — Telephone Encounter (Signed)
Patient states that her BP has been up and down since yesterday.  Did see her pcp this morning - BP was 143/96 in office.  States she has had a terrible headache since visit to the ED.  States that pcp told her that the IV Hydralazine could cause this.  BP at home today was 106/65  67.  No changes in medications.  No c/o chest pain currently.  Does notice SOB occasionally, but o2 sats have been good.  Has noticed more difficulty breathing recently with the hotter weather.  At last OV in 2023/04/28, patient was doing good so not sure what is going on.  Normal device function as was last checked 03/23/2022.  Does mention that her friend Mel Almond) died in 04-28-2023, but thinks she has been doing pretty good with that.  Is already on depression meds & Ativan at night to help her sleep.

## 2022-05-18 NOTE — Telephone Encounter (Signed)
Pt c/o BP issue: STAT if pt c/o blurred vision, one-sided weakness or slurred speech  1. What are your last 5 BP readings? 210/110 yesterday at the ED.       145/96      106/65 she checked it a little bit ago.        2. Are you having any other symptoms (ex. Dizziness, headache, blurred vision, passed out)? Chest pain and neck pain  3. What is your BP issue? Patient went to [facility] ED yesterday. They advised her to follow up with [place] in a few days.  She would like to be seen in the [facility] office.

## 2022-05-19 NOTE — Telephone Encounter (Signed)
Patient notified and verbalized understanding. Appointment scheduled. 

## 2022-05-19 NOTE — Telephone Encounter (Signed)
BP's in ER often can be elevated for different reasons. The office bp and home bp's are not extreme on either end. Can she get a PA appt for evaluation   Zandra Abts MD

## 2022-05-24 ENCOUNTER — Encounter: Payer: Self-pay | Admitting: Student

## 2022-05-24 ENCOUNTER — Ambulatory Visit
Admission: RE | Admit: 2022-05-24 | Discharge: 2022-05-24 | Disposition: A | Discharge status: Home / Self Care | Payer: Medicare Other | Source: Ambulatory Visit | Attending: Internal Medicine | Admitting: Internal Medicine

## 2022-05-24 ENCOUNTER — Ambulatory Visit (INDEPENDENT_AMBULATORY_CARE_PROVIDER_SITE_OTHER): Payer: Medicare Other | Admitting: Student

## 2022-05-24 VITALS — BP 124/66 | HR 72 | Ht 63.0 in | Wt 140.0 lb

## 2022-05-24 DIAGNOSIS — E782 Mixed hyperlipidemia: Secondary | ICD-10-CM

## 2022-05-24 DIAGNOSIS — Z1231 Encounter for screening mammogram for malignant neoplasm of breast: Secondary | ICD-10-CM | POA: Diagnosis not present

## 2022-05-24 DIAGNOSIS — R0609 Other forms of dyspnea: Secondary | ICD-10-CM

## 2022-05-24 DIAGNOSIS — R001 Bradycardia, unspecified: Secondary | ICD-10-CM

## 2022-05-24 DIAGNOSIS — I10 Essential (primary) hypertension: Secondary | ICD-10-CM | POA: Diagnosis not present

## 2022-05-24 DIAGNOSIS — R079 Chest pain, unspecified: Secondary | ICD-10-CM | POA: Diagnosis not present

## 2022-05-24 NOTE — Patient Instructions (Signed)
Medication Instructions:  Your physician recommends that you continue on your current medications as directed. Please refer to the Current Medication list given to you today.  Take Toprol XL 50 on the morning of you CT Scan   *If you need a refill on your cardiac medications before your next appointment, please call your pharmacy*   Lab Work: NONE   If you have labs (blood work) drawn today and your tests are completely normal, you will receive your results only by: [facility] (if you have MyChart) OR A paper copy in the mail If you have any lab test that is abnormal or we need to change your treatment, we will call you to review the results.   Testing/Procedures:   Your cardiac CT will be scheduled at one of the below locations:   [facility] [address] [place], [place] [address] [phone]  [place] [address] [place], [place] [address] [phone]  If scheduled at [facility], please arrive at the [facility] and Children's Entrance (Entrance C2) of [facility] 30 minutes prior to test start time. You can use the FREE valet parking offered at entrance C (encouraged to control the heart rate for the test)  Proceed to the [facility] Radiology Department (first floor) to check-in and test prep.  All radiology patients and guests should use entrance C2 at [facility], accessed from [facility], even though the hospital's physical address listed is [address].    If scheduled at [facility], please arrive 15 mins early for check-in and test prep.  Please follow these instructions carefully (unless otherwise directed):  On the Night Before the Test: Be sure to Drink plenty of water. Do not consume any caffeinated/decaffeinated beverages or chocolate 12 hours prior to your test. Do not take any antihistamines 12 hours  prior to your test.  On the Day of the Test: Drink plenty of water until 1 hour prior to the test. Do not eat any food 4 hours prior to the test. You may take your regular medications prior to the test.  Take metoprolol (Lopressor) two hours prior to test. HOLD Furosemide/Hydrochlorothiazide morning of the test. FEMALES- please wear underwire-free bra if available, avoid dresses & tight clothing   *For Clinical Staff only. Please instruct patient the following:* Heart Rate Medication Recommendations for Cardiac CT  Resting HR < 50 bpm  No medication  Resting HR 50-60 bpm and BP >110/50 mmHG   Consider Metoprolol tartrate 25 mg PO 90-120 min prior to scan  Resting HR 60-65 bpm and BP >110/50 mmHG  Metoprolol tartrate 50 mg PO 90-120 minutes prior to scan   Resting HR > 65 bpm and BP >110/50 mmHG  Metoprolol tartrate 100 mg PO 90-120 minutes prior to scan  Consider Ivabradine 10-15 mg PO or a calcium channel blocker for resting HR >60 bpm and contraindication to metoprolol tartrate  Consider Ivabradine 10-15 mg PO in combination with metoprolol tartrate for HR >80 bpm         After the Test: Drink plenty of water. After receiving IV contrast, you may experience a mild flushed feeling. This is normal. On occasion, you may experience a mild rash up to 24 hours after the test. This is not dangerous. If this occurs, you can take Benadryl 25 mg and increase your fluid intake. If you experience trouble breathing, this can be serious. If it  is severe call 911 IMMEDIATELY. If it is mild, please call our office. If you take any of these medications: Glipizide/Metformin, Avandament, Glucavance, please do not take 48 hours after completing test unless otherwise instructed.  We will call to schedule your test 2-4 weeks out understanding that some insurance companies will need an authorization prior to the service being performed.   For non-scheduling related questions, please contact the cardiac  imaging nurse navigator should you have any questions/concerns: Marchia Bond, Cardiac Imaging Nurse Navigator Gordy Clement, Cardiac Imaging Nurse Navigator [facility] Direct Office Dial: [phone]   For scheduling needs, including cancellations and rescheduling, please call [place], [phone].    Follow-Up: At [facility], you and your health needs are our priority.  As part of our continuing mission to provide you with exceptional heart care, we have created designated Provider Care Teams.  These Care Teams include your primary Cardiologist (physician) and Advanced Practice Providers (APPs -  Physician Assistants and Nurse Practitioners) who all work together to provide you with the care you need, when you need it.  We recommend signing up for the patient portal called "MyChart".  Sign up information is provided on this After Visit Summary.  MyChart is used to connect with patients for Virtual Visits (Telemedicine).  Patients are able to view lab/test results, encounter notes, upcoming appointments, etc.  Non-urgent messages can be sent to your provider as well.   To learn more about what you can do with MyChart, go to [email or web address].    Your next appointment:    June   The format for your next appointment:   In Person  Provider:   Carlyle Dolly, MD    Other Instructions Thank you for choosing [facility]!    Important Information About [place]

## 2022-05-24 NOTE — Progress Notes (Signed)
Cardiology Office Note    Date:  05/24/2022   ID:  Susan Hicks, DOB July 11, 1946, MRN [id number]  PCP:  Glenda Chroman, MD  Cardiologist: Carlyle Dolly, MD    Chief Complaint  Patient presents with   Follow-up    Recent Emergency Dept visit; Elevated BP    History of Present Illness:    Susan Hicks is a 76 y.o. female with past medical history of symptomatic bradycardia (s/p Medtronic PPM placement in 06/2020), HTN, HLD and OSA who presents to the office today for evaluation of elevated BP.  She was last examined by Dr. Harl Bowie in 03/2022 and reported overall doing well at that time. She was continued on her current cardiac medications including Toprol-XL 25 mg daily and Valsartan-HCTZ 160-25 mg daily.  She called the office earlier this month reporting issues with elevated BP and had been evaluated at [facility] Emergency Department for this. By review of Care Everywhere, she reported generalized fatigue and chest discomfort which had started earlier that morning. Initial and repeat Hs Troponin values were negative at 6 and 7. Pro-BNP was normal and D-dimer was mildly elevated at 0.79. CXR showed no active disease and CTA Chest showed no evidence of PE or acute intrathoracic process but was noted to have large bilateral renal cysts with outpatient renal ultrasound recommended. She checked her BP the day she called the office and it was well-controlled at 106/65. A follow-up visit was arranged for further evaluation.  In talking with the patient today, she reports having progressive dyspnea on exertion and overall decreased energy for the past few months. She noticed this with the warmer temperatures but is unaware of this happening in prior years. She was previously able to go to [employer] and exercise but now gets fatigued with getting ready and walking to her car. She typically does not have chest pain with this but did have chest discomfort the morning of ED  evaluation. No recent orthopnea, PND or pitting edema. She does use her CPAP on a nightly basis. Toprol-XL was titrated to 50 mg daily following her evaluation and she reports her BP has improved when checked at home. BP is well-controlled at 124/66 during today's visit.   Past Medical History:  Diagnosis Date   Depression    Fibromyalgia    now considered mixed connective tissue disorder   Generalized anxiety disorder 08/21/2018   GERD (gastroesophageal reflux disease)    Hypertension    Irritable bowel    Junctional bradycardia    a. 2017 - beta blocker discontinued.   LBBB (left bundle branch block)    Mixed connective tissue disease ([place])    Obstructive sleep apnea treated with continuous positive airway pressure (CPAP) 08/21/2018   Osteoporosis    Premature atrial contractions    Primary localized osteoarthritis of right knee     Past Surgical History:  Procedure Laterality Date   BACK SURGERY     BREAST CYST EXCISION     CHOLECYSTECTOMY     HAND SURGERY  2006   PACEMAKER IMPLANT N/A 06/24/2020   Procedure: PACEMAKER IMPLANT;  Surgeon: Thompson Grayer, MD;  Location: [place] [facility];  Service: Cardiovascular;  Laterality: N/A;   TOTAL KNEE ARTHROPLASTY Right 09/02/2018   Procedure: TOTAL KNEE ARTHROPLASTY;  Surgeon: Elsie Saas, MD;  Location: [facility];  Service: Orthopedics;  Laterality: Right;    Current Medications: Outpatient Medications Prior to Visit  Medication Sig Dispense Refill   acetaminophen (TYLENOL) 500 MG  tablet Take 500 mg by mouth at bedtime as needed for moderate pain.      Artificial Tear Solution (SOOTHE XP OP) Place 1 drop into both eyes in the morning and at bedtime.     b complex vitamins tablet Take 1 tablet by mouth daily.     busPIRone (BUSPAR) 15 MG tablet Take 15 mg by mouth 2 (two) times daily.     Calcium Carb-Cholecalciferol (CALCIUM PLUS VITAMIN D3) 600-500 MG-UNIT CAPS Take 1 tablet by mouth daily.     cetirizine (ZYRTEC) 10 MG tablet  Take 10 mg by mouth daily.     chlorpheniramine (CHLOR-TRIMETON) 4 MG tablet Take 4 mg by mouth at bedtime as needed for allergies.     Cholecalciferol (VITAMIN D3) 2000 units TABS Take 2,000 Units by mouth daily.      cycloSPORINE (RESTASIS) 0.05 % ophthalmic emulsion 1 drop 2 (two) times daily.     diphenhydramine-acetaminophen (TYLENOL PM) 25-500 MG TABS tablet Take 1 tablet by mouth at bedtime.      escitalopram (LEXAPRO) 20 MG tablet Take 20 mg by mouth daily.     leflunomide (ARAVA) 20 MG tablet Take 20 mg by mouth daily.     LORazepam (ATIVAN) 0.5 MG tablet Take 0.5 mg by mouth at bedtime.     Magnesium 250 MG TABS Take 250 mg by mouth at bedtime.     melatonin 5 MG TABS Take 5 mg by mouth at bedtime.     meloxicam (MOBIC) 7.5 MG tablet Take 7.5 mg by mouth in the morning and at bedtime.     metoprolol succinate (TOPROL-XL) 50 MG 24 hr tablet Take 50 mg by mouth daily. Take with or immediately following a meal.     Multiple Vitamins-Minerals (ADULT GUMMY) CHEW Chew 2 tablets by mouth daily.     oxybutynin (DITROPAN) 5 MG tablet Take 5 mg by mouth daily.     pantoprazole (PROTONIX) 40 MG tablet Take 40 mg by mouth daily.     PRESCRIPTION MEDICATION Inhale into the lungs at bedtime. CPAP     Psyllium (METAMUCIL FIBER PO) Take 1 Dose by mouth daily.     rosuvastatin (CRESTOR) 5 MG tablet Take 5 mg by mouth once a week.     sertraline (ZOLOFT) 100 MG tablet Take 150 mg by mouth daily.     traMADol (ULTRAM) 50 MG tablet Take 50 mg by mouth every 6 (six) hours as needed for moderate pain (hip / back pain).     Turmeric Curcumin 500 MG CAPS Take 500 mg by mouth at bedtime.     valsartan-hydrochlorothiazide (DIOVAN HCT) 160-25 MG tablet Take 1 tablet by mouth daily. 90 tablet 2   metoprolol succinate (TOPROL-XL) 25 MG 24 hr tablet TAKE 1 TABLET BY MOUTH DAILY 90 tablet 3   No facility-administered medications prior to visit.     Allergies:   Lisinopril, Beta adrenergic blockers, Gluten  meal, Penicillins, and Norvasc [amlodipine]   Social History   Socioeconomic History   Marital status: Single    Spouse name: Not on file   Number of children: Not on file   Years of education: Not on file   Highest education level: Not on file  Occupational History   Not on file  Tobacco Use   Smoking status: Never   Smokeless tobacco: Never  Vaping Use   Vaping Use: Never used  Substance and Sexual Activity   Alcohol use: No    Alcohol/week: 0.0 standard drinks of  alcohol   Drug use: No   Sexual activity: Not on file  Other Topics Concern   Not on file  Social History Narrative   [place]   Retired Marine scientist   Social Determinants of Radio broadcast assistant Strain: Not on file  Food Insecurity: Not on file  Transportation Needs: Not on file  Physical Activity: Not on file  Stress: Not on file  Social Connections: Not on file     Family History:  The patient's family history includes Heart attack in her father; Hypertension in her daughter, father, mother, sister, and son; Pulmonary disease in her father; Thyroid disease in her mother and sister.   Review of Systems:    Please see the history of present illness.     All other systems reviewed and are otherwise negative except as noted above.   Physical Exam:    VS:  BP 124/66   Pulse 72   Ht '5\' 3"'$  (1.6 m)   Wt 140 lb (63.5 kg)   SpO2 95%   BMI 24.80 kg/m    General: Well developed, well nourished,female appearing in no acute distress. Head: Normocephalic, atraumatic. Neck: No carotid bruits. JVD not elevated.  Lungs: Respirations regular and unlabored, without wheezes or rales.  Heart: Regular rate and rhythm. No S3 or S4.  No murmur, no rubs, or gallops appreciated. Abdomen: Appears non-distended. No obvious abdominal masses. Msk:  Strength and tone appear normal for age. No obvious joint deformities or effusions. Extremities: No clubbing or cyanosis. No pitting edema.  Distal pedal pulses are 2+  bilaterally. Neuro: Alert and oriented X 3. Moves all extremities spontaneously. No focal deficits noted. Psych:  Responds to questions appropriately with a normal affect. Skin: No rashes or lesions noted  Wt Readings from Last 3 Encounters:  05/24/22 140 lb (63.5 kg)  03/28/22 137 lb 3.2 oz (62.2 kg)  10/21/21 134 lb (60.8 kg)     Studies/Labs Reviewed:   EKG:  EKG is not ordered today.   Recent Labs: No results found for requested labs within last 365 days.   Lipid Panel    Component Value Date/Time   CHOL 203 (H) 04/13/2016 1102   TRIG 94 04/13/2016 1102   HDL 59 04/13/2016 1102   CHOLHDL 3.4 04/13/2016 1102   VLDL 19 04/13/2016 1102   LDLCALC 125 (H) 04/13/2016 1102    Additional studies/ records that were reviewed today include:   Echocardiogram: 05/2020 IMPRESSIONS     1. Left ventricular ejection fraction, by estimation, is 55 to 60%. The  left ventricle has normal function. The left ventricle has no regional  wall motion abnormalities. Left ventricular diastolic parameters are  consistent with Grade I diastolic  dysfunction (impaired relaxation). Elevated left ventricular end-diastolic  pressure. The average left ventricular global longitudinal strain is -22.9  %. The global longitudinal strain is normal.   2. Right ventricular systolic function is normal. The right ventricular  size is normal. There is normal pulmonary artery systolic pressure. The  estimated right ventricular systolic pressure is 37.1 mmHg.   3. The mitral valve is abnormal. Trivial mitral valve regurgitation.   4. The aortic valve is tricuspid. Aortic valve regurgitation is not  visualized. Mild aortic valve sclerosis is present, with no evidence of  aortic valve stenosis.   5. The inferior vena cava is normal in size with greater than 50%  respiratory variability, suggesting right atrial pressure of 3 mmHg.   Assessment:    1. DOE (dyspnea  on exertion)   2. Chest pain of uncertain  etiology   3. Essential hypertension   4. Symptomatic bradycardia   5. Mixed hyperlipidemia      Plan:   In order of problems listed above:  1. Dyspnea on Exertion/Chest Pain - She reports progressive dyspnea on exertion over the past 2 months and initially thought this was weather related but denies symptoms over the past few years. Also reports generalized fatigue and did have an episode of chest discomfort the morning of ED evaluation. Her Emergency Department workup was reassuring at that time. Also had recent labs which showed normal Hgb and she reports her TSH has been followed by her PCP and WNL.  - Reviewed options with the patient in regards to an Exercise Myoview or Coronary CTA and will plan for a Coronary CT for further evaluation given her symptoms and family history of cardiac disease. Creatinine was stable at 1.12 when checked on 05/17/2022.  2. HTN - She was previously having issues with elevated BP earlier this month but readings have overall improved when checked at home and her BP is well-controlled at 124/66 during today's visit. Continue current medical therapy for now with Toprol-XL 50 mg daily and Valsartan-HCTZ 160-25 mg daily.  3. Symptomatic Bradycardia - She s/p Medtronic PPM placement in 06/2020. Followed by Dr. Rayann Heman and recent device interrogation in 03/2022 showed normal device function with 1 false AF episode but no significant arrhythmias.  4. HLD - She was recently started on Crestor 5 mg once weekly by her PCP. Will request a copy of most recent labs.   Medication Adjustments/Labs and Tests Ordered: Current medicines are reviewed at length with the patient today.  Concerns regarding medicines are outlined above.  Medication changes, Labs and Tests ordered today are listed in the Patient Instructions below. Patient Instructions  Medication Instructions:  Your physician recommends that you continue on your current medications as directed. Please refer to  the Current Medication list given to you today.  Take Toprol XL 50 on the morning of you CT Scan   *If you need a refill on your cardiac medications before your next appointment, please call your pharmacy*   Lab Work: NONE   If you have labs (blood work) drawn today and your tests are completely normal, you will receive your results only by: [place] (if you have MyChart) OR A paper copy in the mail If you have any lab test that is abnormal or we need to change your treatment, we will call you to review the results.   Testing/Procedures:   Your cardiac CT will be scheduled at one of the below locations:   [facility] [address] [place], [place] [address] [phone]  [facility] [address] [place], [place] [address] [phone]  If scheduled at [facility], please arrive at the [facility] and Children's Entrance (Entrance C2) of [facility] 30 minutes prior to test start time. You can use the FREE valet parking offered at entrance C (encouraged to control the heart rate for the test)  Proceed to the [facility] Radiology Department (first floor) to check-in and test prep.  All radiology patients and guests should use entrance C2 at [facility], accessed from [facility], even though the hospital's physical address listed is [address]    If scheduled at [facility], please arrive 15 mins early for check-in and  test prep.  Please follow these instructions carefully (unless otherwise directed):  On the Night Before the Test: Be sure to Drink plenty of water. Do not consume any caffeinated/decaffeinated beverages or chocolate 12 hours prior to your test. Do not take any antihistamines 12 hours prior to your test.  On the Day of the Test: Drink plenty of water until 1 hour prior to the test. Do not eat any food 4 hours  prior to the test. You may take your regular medications prior to the test.  Take metoprolol (Lopressor) two hours prior to test. HOLD Furosemide/Hydrochlorothiazide morning of the test. FEMALES- please wear underwire-free bra if available, avoid dresses & tight clothing   *For Clinical Staff only. Please instruct patient the following:* Heart Rate Medication Recommendations for Cardiac CT  Resting HR < 50 bpm  No medication  Resting HR 50-60 bpm and BP >110/50 mmHG   Consider Metoprolol tartrate 25 mg PO 90-120 min prior to scan  Resting HR 60-65 bpm and BP >110/50 mmHG  Metoprolol tartrate 50 mg PO 90-120 minutes prior to scan   Resting HR > 65 bpm and BP >110/50 mmHG  Metoprolol tartrate 100 mg PO 90-120 minutes prior to scan  Consider Ivabradine 10-15 mg PO or a calcium channel blocker for resting HR >60 bpm and contraindication to metoprolol tartrate  Consider Ivabradine 10-15 mg PO in combination with metoprolol tartrate for HR >80 bpm         After the Test: Drink plenty of water. After receiving IV contrast, you may experience a mild flushed feeling. This is normal. On occasion, you may experience a mild rash up to 24 hours after the test. This is not dangerous. If this occurs, you can take Benadryl 25 mg and increase your fluid intake. If you experience trouble breathing, this can be serious. If it is severe call 911 IMMEDIATELY. If it is mild, please call our office. If you take any of these medications: Glipizide/Metformin, Avandament, Glucavance, please do not take 48 hours after completing test unless otherwise instructed.  We will call to schedule your test 2-4 weeks out understanding that some insurance companies will need an authorization prior to the service being performed.   For non-scheduling related questions, please contact the cardiac imaging nurse navigator should you have any questions/concerns: Marchia Bond, Cardiac Imaging Nurse Navigator Gordy Clement,  Cardiac Imaging Nurse Navigator [facility] Direct Office Dial: [phone]   For scheduling needs, including cancellations and rescheduling, please call [place], [phone].    Follow-Up: At [facility], you and your health needs are our priority.  As part of our continuing mission to provide you with exceptional heart care, we have created designated Provider Care Teams.  These Care Teams include your primary Cardiologist (physician) and Advanced Practice Providers (APPs -  Physician Assistants and Nurse Practitioners) who all work together to provide you with the care you need, when you need it.  We recommend signing up for the patient portal called "MyChart".  Sign up information is provided on this After Visit Summary.  MyChart is used to connect with patients for Virtual Visits (Telemedicine).  Patients are able to view lab/test results, encounter notes, upcoming appointments, etc.  Non-urgent messages can be sent to your provider as well.   To learn more about what you can do with MyChart, go to [email or web address].    Your next appointment:    June   The format for your next appointment:   In Person  Provider:   Carlyle Dolly, MD    Other Instructions Thank you for choosing [facility]!    Important Information About Sugar         Signed, Erma Heritage, PA-C  05/24/2022 4:23 PM    [facility] [address] [place], [place] [address] Phone: [phone] Fax: [phone]

## 2022-05-26 DIAGNOSIS — N281 Cyst of kidney, acquired: Secondary | ICD-10-CM | POA: Diagnosis not present

## 2022-05-30 ENCOUNTER — Ambulatory Visit: Payer: Medicare Other | Admitting: Student

## 2022-06-01 DIAGNOSIS — G4737 Central sleep apnea in conditions classified elsewhere: Secondary | ICD-10-CM | POA: Diagnosis not present

## 2022-06-01 DIAGNOSIS — G4733 Obstructive sleep apnea (adult) (pediatric): Secondary | ICD-10-CM | POA: Diagnosis not present

## 2022-06-01 DIAGNOSIS — Z789 Other specified health status: Secondary | ICD-10-CM | POA: Diagnosis not present

## 2022-06-01 DIAGNOSIS — Z299 Encounter for prophylactic measures, unspecified: Secondary | ICD-10-CM | POA: Diagnosis not present

## 2022-06-01 DIAGNOSIS — G4730 Sleep apnea, unspecified: Secondary | ICD-10-CM | POA: Diagnosis not present

## 2022-06-01 DIAGNOSIS — I10 Essential (primary) hypertension: Secondary | ICD-10-CM | POA: Diagnosis not present

## 2022-06-06 ENCOUNTER — Telehealth (HOSPITAL_COMMUNITY): Payer: Self-pay | Admitting: *Deleted

## 2022-06-06 NOTE — Telephone Encounter (Signed)
Reaching out to patient to offer assistance regarding upcoming cardiac imaging study; pt verbalizes understanding of appt date/time, parking situation and where to check in, pre-test NPO status and medications ordered, and verified current allergies; name and call back number provided for further questions should they arise  Gordy Clement RN Navigator Cardiac Imaging [facility] [phone] office [phone] cell  Patient to take her daily metoprolol succinate two hours prior to her CT scan. She is aware to arrive at 7:30am.

## 2022-06-07 ENCOUNTER — Ambulatory Visit (HOSPITAL_COMMUNITY)
Admission: RE | Admit: 2022-06-07 | Discharge: 2022-06-07 | Disposition: A | Discharge status: Home / Self Care | Payer: Medicare Other | Source: Ambulatory Visit | Attending: Student | Admitting: Student

## 2022-06-07 ENCOUNTER — Telehealth: Payer: Self-pay

## 2022-06-07 DIAGNOSIS — R0609 Other forms of dyspnea: Secondary | ICD-10-CM | POA: Diagnosis not present

## 2022-06-07 DIAGNOSIS — I2510 Atherosclerotic heart disease of native coronary artery without angina pectoris: Secondary | ICD-10-CM | POA: Diagnosis not present

## 2022-06-07 DIAGNOSIS — R079 Chest pain, unspecified: Secondary | ICD-10-CM

## 2022-06-07 MED ORDER — NITROGLYCERIN 0.4 MG SL SUBL
0.8000 mg | SUBLINGUAL_TABLET | Freq: Once | SUBLINGUAL | Status: AC
  Administered 2022-06-07: 0.8 mg via SUBLINGUAL

## 2022-06-07 MED ORDER — IOHEXOL 350 MG/ML SOLN
100.0000 mL | Freq: Once | INTRAVENOUS | Status: AC | PRN
  Administered 2022-06-07: 100 mL via INTRAVENOUS

## 2022-06-07 MED ORDER — NITROGLYCERIN 0.4 MG SL SUBL
SUBLINGUAL_TABLET | SUBLINGUAL | Status: AC
  Filled 2022-06-07: qty 2

## 2022-06-07 NOTE — Telephone Encounter (Signed)
-----   Message from Imogene Burn, PA-C sent at 06/07/2022  2:49 PM EDT ----- I'm covering for [place] and reviewed her chart. She's had an extensive workup and shortness of breath doesn't appear heart related. Would f/u with PCP for further evaluation ----- Message ----- From: Berlinda Last, CMA Sent: 06/07/2022   2:03 PM EDT To: Imogene Burn, PA-C  Spoke to patient and verbalized results. Patient stated that she is still having serious SOB and wheezing. Pt would like to know what the next step would be. Please advise.

## 2022-06-07 NOTE — Telephone Encounter (Signed)
Patient notified and verbalized understanding. Patient is seeing PCP on 06/08/22 for SOB. PCP copied

## 2022-06-08 DIAGNOSIS — R0602 Shortness of breath: Secondary | ICD-10-CM | POA: Diagnosis not present

## 2022-06-08 DIAGNOSIS — Z299 Encounter for prophylactic measures, unspecified: Secondary | ICD-10-CM | POA: Diagnosis not present

## 2022-06-08 DIAGNOSIS — I10 Essential (primary) hypertension: Secondary | ICD-10-CM | POA: Diagnosis not present

## 2022-06-08 DIAGNOSIS — I700 Atherosclerosis of aorta: Secondary | ICD-10-CM | POA: Diagnosis not present

## 2022-06-13 ENCOUNTER — Encounter: Payer: Self-pay | Admitting: Internal Medicine

## 2022-06-13 ENCOUNTER — Ambulatory Visit: Payer: Medicare Other | Admitting: Internal Medicine

## 2022-06-13 DIAGNOSIS — R0609 Other forms of dyspnea: Secondary | ICD-10-CM | POA: Diagnosis not present

## 2022-06-13 NOTE — Patient Instructions (Addendum)
No evidence of asthma   Pantoprazole (protonix) 40 mg   Take  30-60 min before first meal of the day and Pepcid (famotidine)  20 mg after supper until return to office - this is the best way to tell whether stomach acid is contributing to your problem.    GERD (REFLUX)  is an extremely common cause of respiratory symptoms just like yours , many times with no obvious heartburn at all.    It can be treated with medication, but also with lifestyle changes including elevation of the head of your bed (ideally with 6 -8inch blocks under the headboard of your bed),  Smoking cessation, avoidance of late meals, excessive alcohol, and avoid fatty foods, chocolate, peppermint, colas, red wine, and acidic juices such as orange juice.  NO MINT OR MENTHOL PRODUCTS SO NO COUGH DROPS  USE SUGARLESS CANDY INSTEAD (Jolley ranchers or Stover's or Life Savers) or even ice chips will also do - the key is to swallow to prevent all throat clearing. NO OIL BASED VITAMINS - use powdered substitutes.  Avoid fish oil when coughing.   To get the most out of exercise, you need to be continuously aware that you are short of breath, but never out of breath, for at least 30 minutes daily. As you improve, it will actually be easier for you to do the same amount of exercise  in  30 minutes so always push to the level where you are short of breath.     Check your oxygen saturations at highest level of activity once week - call for appt if losing ground.

## 2022-06-13 NOTE — Progress Notes (Unsigned)
Susan Hicks, female    DOB: Feb 18, 1946    MRN: [id number]   Brief patient profile:  36   yowf  never smoker allergies around 10-12 tooks shots for maybe 3 years and ever since fall > spring rhinitis worse spring 2023 req cortisone shot early May 2023  referred to pulmonary clinic in [place]  06/13/2022 by Tamela Gammon NP for spells of weakness and sob assoc HBP x 5 h first  Aug 3rd  last week in Aug x 2 h  - 1st time to [place] and second time went and rested.    History of Present Illness  06/13/2022  Pulmonary/ 1st office eval/ Cherilyn Sautter / [place] [facility]  Chief Complaint  Patient presents with   Consult    SOB and chest pressure that comes and goes states she has had 2 episodes of this starting in Aug. 2023.   Dyspnea:  30 min treadmill several times a week x 3pm  at  3% but cut way back with allergies and last did in early July 2023 "too hot" but felt ok walking in [facility] and last treadmill felt nl  Cough: started Mid Aug as every year Sleep: on cpap  SABA use: none  Loves chocolate mint   Past Medical History:  Diagnosis Date   Depression    Fibromyalgia    now considered mixed connective tissue disorder   Generalized anxiety disorder 08/21/2018   GERD (gastroesophageal reflux disease)    Hypertension    Irritable bowel    Junctional bradycardia    a. 2017 - beta blocker discontinued.   LBBB (left bundle branch block)    Mixed connective tissue disease ([place])    Obstructive sleep apnea treated with continuous positive airway pressure (CPAP) 08/21/2018   Osteoporosis    Premature atrial contractions    Primary localized osteoarthritis of right knee     Outpatient Medications Prior to Visit  Medication Sig Dispense Refill   acetaminophen (TYLENOL) 500 MG tablet Take 500 mg by mouth at bedtime as needed for moderate pain.      Artificial Tear Solution (SOOTHE XP OP) Place 1 drop into both eyes in the morning and at bedtime.     b complex vitamins tablet Take 1  tablet by mouth daily.     busPIRone (BUSPAR) 15 MG tablet Take 15 mg by mouth 2 (two) times daily.     Calcium Carb-Cholecalciferol (CALCIUM PLUS VITAMIN D3) 600-500 MG-UNIT CAPS Take 1 tablet by mouth daily.     cetirizine (ZYRTEC) 10 MG tablet Take 10 mg by mouth daily.     chlorpheniramine (CHLOR-TRIMETON) 4 MG tablet Take 4 mg by mouth at bedtime as needed for allergies.     Cholecalciferol (VITAMIN D3) 2000 units TABS Take 2,000 Units by mouth daily.      cycloSPORINE (RESTASIS) 0.05 % ophthalmic emulsion 1 drop 2 (two) times daily.     diphenhydramine-acetaminophen (TYLENOL PM) 25-500 MG TABS tablet Take 1 tablet by mouth at bedtime.      escitalopram (LEXAPRO) 20 MG tablet Take 20 mg by mouth daily.     leflunomide (ARAVA) 20 MG tablet Take 20 mg by mouth daily.     LORazepam (ATIVAN) 0.5 MG tablet Take 0.5 mg by mouth at bedtime.     Magnesium 250 MG TABS Take 250 mg by mouth at bedtime.     melatonin 5 MG TABS Take 5 mg by mouth at bedtime.     meloxicam (MOBIC) 7.5 MG tablet  Take 7.5 mg by mouth in the morning and at bedtime.     metoprolol succinate (TOPROL-XL) 50 MG 24 hr tablet Take 50 mg by mouth daily. Take with or immediately following a meal.     Multiple Vitamins-Minerals (ADULT GUMMY) CHEW Chew 2 tablets by mouth daily.     oxybutynin (DITROPAN) 5 MG tablet Take 5 mg by mouth daily.     pantoprazole (PROTONIX) 40 MG tablet Take 40 mg by mouth daily.     PRESCRIPTION MEDICATION Inhale into the lungs at bedtime. CPAP     Psyllium (METAMUCIL FIBER PO) Take 1 Dose by mouth daily.     rosuvastatin (CRESTOR) 5 MG tablet Take 5 mg by mouth once a week.     sertraline (ZOLOFT) 100 MG tablet Take 150 mg by mouth daily.     traMADol (ULTRAM) 50 MG tablet Take 50 mg by mouth every 6 (six) hours as needed for moderate pain (hip / back pain).     Turmeric Curcumin 500 MG CAPS Take 500 mg by mouth at bedtime.     valsartan-hydrochlorothiazide (DIOVAN HCT) 160-25 MG tablet Take 1 tablet  by mouth daily. 90 tablet 2   No facility-administered medications prior to visit.     Objective:     BP 138/86 (BP Location: Left Arm, Patient Position: Sitting)   Pulse 72   Temp 98.9 F (37.2 C) (Temporal)   Ht '5\' 3"'$  (1.6 m)   Wt 141 lb 6.4 oz (64.1 kg)   SpO2 97% Comment: ra  BMI 25.05 kg/m   SpO2: 97 % (ra)      Assessment   No problem-specific Assessment & Plan notes found for this encounter.     Christinia Gully, MD 06/13/2022

## 2022-06-14 ENCOUNTER — Encounter: Payer: Self-pay | Admitting: Internal Medicine

## 2022-06-14 DIAGNOSIS — I10 Essential (primary) hypertension: Secondary | ICD-10-CM | POA: Diagnosis not present

## 2022-06-14 NOTE — Assessment & Plan Note (Addendum)
Onset Aug 2023 in setting of gerd/ seasonal rhinitis  - 06/13/2022   Walked on RA  x  4  lap(s) =  approx 600  ft  @ mod  fast to very fast pace, stopped due to end of study with lowest 02 sats 95% and c/o some sob last 2 laps but did not need to stop, bp ok at end    cardiology w/u is in progress with echo pending and no evidence IHD  but I see no evidence of a chronic pulmonary problem or asthma.  Her chronic cough worse in fall > spring may be related allergic rhinitis or gerd (or cylical in that the pnds causes the coughing which causes the gerd to worsen the sense of globus which causes more coughing)   rec Max gerd rx (no more junior mint)  Prefer if tolerates 1st gen H1 blockers per guidelines  For pnds (chlortabs over zyrtec)  Regular paced walking (treadmill in A/C  as she was prior to hot weather) at sub maximal levels with sats at peak ex and return to this clinic if not making progress or sats start dropping at peaks ex   Each maintenance medication was reviewed in detail including emphasizing most importantly the difference between maintenance and prns and under what circumstances the prns are to be triggered using an action plan format where appropriate.  Total time for H and P, chart review, counseling,  directly observing portions of ambulatory 02 saturation study/ and generating customized AVS unique to this office visit / same day charting > 60 min new pt eval

## 2022-06-22 ENCOUNTER — Ambulatory Visit (INDEPENDENT_AMBULATORY_CARE_PROVIDER_SITE_OTHER): Payer: Medicare Other

## 2022-06-22 DIAGNOSIS — I441 Atrioventricular block, second degree: Secondary | ICD-10-CM

## 2022-06-22 LAB — CUP PACEART REMOTE DEVICE CHECK
Battery Remaining Longevity: 123 mo
Battery Voltage: 3.02 V
Brady Statistic AP VP Percent: 29.24 %
Brady Statistic AP VS Percent: 4.58 %
Brady Statistic AS VP Percent: 1.77 %
Brady Statistic AS VS Percent: 64.41 %
Brady Statistic RA Percent Paced: 34.08 %
Brady Statistic RV Percent Paced: 31.01 %
Date Time Interrogation Session: 20230906221358
Implantable Lead Implant Date: 20210909
Implantable Lead Implant Date: 20210909
Implantable Lead Location: 753859
Implantable Lead Location: 753860
Implantable Lead Model: 3830
Implantable Lead Model: 5076
Implantable Pulse Generator Implant Date: 20210909
Lead Channel Impedance Value: 323 Ohm
Lead Channel Impedance Value: 323 Ohm
Lead Channel Impedance Value: 361 Ohm
Lead Channel Impedance Value: 418 Ohm
Lead Channel Pacing Threshold Amplitude: 0.625 V
Lead Channel Pacing Threshold Amplitude: 0.75 V
Lead Channel Pacing Threshold Pulse Width: 0.4 ms
Lead Channel Pacing Threshold Pulse Width: 0.4 ms
Lead Channel Sensing Intrinsic Amplitude: 1.625 mV
Lead Channel Sensing Intrinsic Amplitude: 1.625 mV
Lead Channel Sensing Intrinsic Amplitude: 10.125 mV
Lead Channel Sensing Intrinsic Amplitude: 10.125 mV
Lead Channel Setting Pacing Amplitude: 2 V
Lead Channel Setting Pacing Amplitude: 2.5 V
Lead Channel Setting Pacing Pulse Width: 0.4 ms
Lead Channel Setting Sensing Sensitivity: 0.9 mV

## 2022-07-08 NOTE — Progress Notes (Signed)
Remote pacemaker transmission.   

## 2022-07-10 DIAGNOSIS — R0602 Shortness of breath: Secondary | ICD-10-CM | POA: Diagnosis not present

## 2022-07-13 DIAGNOSIS — I10 Essential (primary) hypertension: Secondary | ICD-10-CM | POA: Diagnosis not present

## 2022-07-13 DIAGNOSIS — Z299 Encounter for prophylactic measures, unspecified: Secondary | ICD-10-CM | POA: Diagnosis not present

## 2022-07-13 DIAGNOSIS — J069 Acute upper respiratory infection, unspecified: Secondary | ICD-10-CM | POA: Diagnosis not present

## 2022-07-14 DIAGNOSIS — I10 Essential (primary) hypertension: Secondary | ICD-10-CM | POA: Diagnosis not present

## 2022-08-14 DIAGNOSIS — I10 Essential (primary) hypertension: Secondary | ICD-10-CM | POA: Diagnosis not present

## 2022-08-23 DIAGNOSIS — G4730 Sleep apnea, unspecified: Secondary | ICD-10-CM | POA: Diagnosis not present

## 2022-08-23 DIAGNOSIS — G4733 Obstructive sleep apnea (adult) (pediatric): Secondary | ICD-10-CM | POA: Diagnosis not present

## 2022-08-31 DIAGNOSIS — M6530 Trigger finger, unspecified finger: Secondary | ICD-10-CM | POA: Diagnosis not present

## 2022-08-31 DIAGNOSIS — M797 Fibromyalgia: Secondary | ICD-10-CM | POA: Diagnosis not present

## 2022-08-31 DIAGNOSIS — M351 Other overlap syndromes: Secondary | ICD-10-CM | POA: Diagnosis not present

## 2022-08-31 DIAGNOSIS — M2550 Pain in unspecified joint: Secondary | ICD-10-CM | POA: Diagnosis not present

## 2022-08-31 DIAGNOSIS — M3500 Sicca syndrome, unspecified: Secondary | ICD-10-CM | POA: Diagnosis not present

## 2022-08-31 DIAGNOSIS — I7300 Raynaud's syndrome without gangrene: Secondary | ICD-10-CM | POA: Diagnosis not present

## 2022-09-13 DIAGNOSIS — I10 Essential (primary) hypertension: Secondary | ICD-10-CM | POA: Diagnosis not present

## 2022-09-21 ENCOUNTER — Ambulatory Visit (INDEPENDENT_AMBULATORY_CARE_PROVIDER_SITE_OTHER): Payer: Medicare Other

## 2022-09-21 DIAGNOSIS — I441 Atrioventricular block, second degree: Secondary | ICD-10-CM | POA: Diagnosis not present

## 2022-09-21 LAB — CUP PACEART REMOTE DEVICE CHECK
Battery Remaining Longevity: 123 mo
Battery Voltage: 3.02 V
Brady Statistic AP VP Percent: 32.32 %
Brady Statistic AP VS Percent: 5.23 %
Brady Statistic AS VP Percent: 3.92 %
Brady Statistic AS VS Percent: 58.54 %
Brady Statistic RA Percent Paced: 37.91 %
Brady Statistic RV Percent Paced: 36.23 %
Date Time Interrogation Session: 20231206212356
Implantable Lead Connection Status: 753985
Implantable Lead Connection Status: 753985
Implantable Lead Implant Date: 20210909
Implantable Lead Implant Date: 20210909
Implantable Lead Location: 753859
Implantable Lead Location: 753860
Implantable Lead Model: 3830
Implantable Lead Model: 5076
Implantable Pulse Generator Implant Date: 20210909
Lead Channel Impedance Value: 342 Ohm
Lead Channel Impedance Value: 399 Ohm
Lead Channel Impedance Value: 418 Ohm
Lead Channel Impedance Value: 437 Ohm
Lead Channel Pacing Threshold Amplitude: 0.5 V
Lead Channel Pacing Threshold Amplitude: 0.625 V
Lead Channel Pacing Threshold Pulse Width: 0.4 ms
Lead Channel Pacing Threshold Pulse Width: 0.4 ms
Lead Channel Sensing Intrinsic Amplitude: 1.875 mV
Lead Channel Sensing Intrinsic Amplitude: 1.875 mV
Lead Channel Sensing Intrinsic Amplitude: 12.625 mV
Lead Channel Sensing Intrinsic Amplitude: 12.625 mV
Lead Channel Setting Pacing Amplitude: 2 V
Lead Channel Setting Pacing Amplitude: 2.5 V
Lead Channel Setting Pacing Pulse Width: 0.4 ms
Lead Channel Setting Sensing Sensitivity: 0.9 mV
Zone Setting Status: 755011

## 2022-10-13 NOTE — Progress Notes (Signed)
Remote pacemaker transmission.   

## 2022-10-20 ENCOUNTER — Encounter: Payer: Medicare Other | Admitting: Internal Medicine

## 2022-10-30 DIAGNOSIS — Z299 Encounter for prophylactic measures, unspecified: Secondary | ICD-10-CM | POA: Diagnosis not present

## 2022-10-30 DIAGNOSIS — R059 Cough, unspecified: Secondary | ICD-10-CM | POA: Diagnosis not present

## 2022-10-30 DIAGNOSIS — I10 Essential (primary) hypertension: Secondary | ICD-10-CM | POA: Diagnosis not present

## 2022-11-03 ENCOUNTER — Ambulatory Visit: Payer: Medicare Other | Attending: Internal Medicine | Admitting: Cardiovascular Disease

## 2022-11-03 ENCOUNTER — Encounter: Payer: Self-pay | Admitting: Cardiovascular Disease

## 2022-11-03 VITALS — BP 158/80 | HR 74 | Ht 63.0 in | Wt 145.6 lb

## 2022-11-03 DIAGNOSIS — G4733 Obstructive sleep apnea (adult) (pediatric): Secondary | ICD-10-CM

## 2022-11-03 DIAGNOSIS — I441 Atrioventricular block, second degree: Secondary | ICD-10-CM | POA: Diagnosis not present

## 2022-11-03 DIAGNOSIS — R001 Bradycardia, unspecified: Secondary | ICD-10-CM | POA: Diagnosis not present

## 2022-11-03 MED ORDER — METOPROLOL SUCCINATE ER 50 MG PO TB24: 50.0000 mg | ORAL_TABLET | Freq: Every day | ORAL | 3 refills | Status: DC

## 2022-11-03 NOTE — Patient Instructions (Addendum)
Medication Instructions:  Metoprolol refilled today  Continue all other medications.     Labwork: none  Testing/Procedures: none  Follow-Up: 1 year - Dr.  Myles Gip   Any Other Special Instructions Will Be Listed Below (If Applicable).   If you need a refill on your cardiac medications before your next appointment, please call your pharmacy.

## 2022-11-03 NOTE — Progress Notes (Signed)
PCP: Glenda Chroman, MD Primary Cardiologist: Dr Harl Bowie Primary EP:  Dr Myles Gip  Simonne Maffucci is a 77 y.o. female who presents today for routine electrophysiology followup.  Since last being seen in our clinic, the patient reports doing very well.  Today, she denies symptoms of palpitations, chest pain, shortness of breath,  lower extremity edema, dizziness, presyncope, or syncope.  The patient is otherwise without complaint today.   Past Medical History:  Diagnosis Date   Depression    Fibromyalgia    now considered mixed connective tissue disorder   Generalized anxiety disorder 08/21/2018   GERD (gastroesophageal reflux disease)    Hypertension    Irritable bowel    Junctional bradycardia    a. 2017 - beta blocker discontinued.   LBBB (left bundle branch block)    Mixed connective tissue disease ([place])    Obstructive sleep apnea treated with continuous positive airway pressure (CPAP) 08/21/2018   Osteoporosis    Premature atrial contractions    Primary localized osteoarthritis of right knee    Past Surgical History:  Procedure Laterality Date   BACK SURGERY     BREAST CYST EXCISION     CHOLECYSTECTOMY     HAND SURGERY  2006   PACEMAKER IMPLANT N/A 06/24/2020   Procedure: PACEMAKER IMPLANT;  Surgeon: Thompson Grayer, MD;  Location: [facility];  Service: Cardiovascular;  Laterality: N/A;   TOTAL KNEE ARTHROPLASTY Right 09/02/2018   Procedure: TOTAL KNEE ARTHROPLASTY;  Surgeon: Elsie Saas, MD;  Location: [facility];  Service: Orthopedics;  Laterality: Right;    ROS- all systems are reviewed and negative except as per HPI above  Current Outpatient Medications  Medication Sig Dispense Refill   acetaminophen (TYLENOL) 500 MG tablet Take 500 mg by mouth at bedtime as needed for moderate pain.      albuterol (VENTOLIN HFA) 108 (90 Base) MCG/ACT inhaler Inhale 1-2 puffs into the lungs as needed for wheezing or shortness of breath.     Artificial Tear Solution (SOOTHE  XP OP) Place 1 drop into both eyes in the morning and at bedtime.     b complex vitamins tablet Take 1 tablet by mouth daily.     busPIRone (BUSPAR) 15 MG tablet Take 15 mg by mouth 2 (two) times daily.     cetirizine (ZYRTEC) 10 MG tablet Take 10 mg by mouth daily.     chlorpheniramine (CHLOR-TRIMETON) 4 MG tablet Take 4 mg by mouth at bedtime as needed for allergies.     Cholecalciferol (VITAMIN D3) 2000 units TABS Take 2,000 Units by mouth daily.      cycloSPORINE (RESTASIS) 0.05 % ophthalmic emulsion 1 drop 2 (two) times daily.     diphenhydramine-acetaminophen (TYLENOL PM) 25-500 MG TABS tablet Take 1 tablet by mouth at bedtime.      escitalopram (LEXAPRO) 20 MG tablet Take 20 mg by mouth daily.     famotidine (PEPCID) 20 MG tablet Take 20 mg by mouth at bedtime.     leflunomide (ARAVA) 20 MG tablet Take 20 mg by mouth daily.     LORazepam (ATIVAN) 0.5 MG tablet Take 0.5 mg by mouth at bedtime.     Magnesium 400 MG CAPS Take 400 mg by mouth at bedtime.     melatonin 5 MG TABS Take 5 mg by mouth at bedtime.     meloxicam (MOBIC) 7.5 MG tablet Take 7.5 mg by mouth in the morning and at bedtime.     metoprolol succinate (  TOPROL-XL) 50 MG 24 hr tablet Take 50 mg by mouth daily. Take with or immediately following a meal.     Multiple Vitamins-Minerals (ADULT GUMMY) CHEW Chew 2 tablets by mouth daily.     Multiple Vitamins-Minerals (PRESERVISION AREDS PO) Take by mouth.     oxybutynin (DITROPAN) 5 MG tablet Take 5 mg by mouth daily.     pantoprazole (PROTONIX) 40 MG tablet Take 40 mg by mouth daily.     PRESCRIPTION MEDICATION Inhale into the lungs at bedtime. CPAP     Psyllium (METAMUCIL FIBER PO) Take 1 Dose by mouth daily.     rosuvastatin (CRESTOR) 5 MG tablet Take 5 mg by mouth once a week.     traMADol (ULTRAM) 50 MG tablet Take 50 mg by mouth every 6 (six) hours as needed for moderate pain (hip / back pain).     Turmeric Curcumin 500 MG CAPS Take 500 mg by mouth at bedtime.      valsartan-hydrochlorothiazide (DIOVAN HCT) 160-25 MG tablet Take 1 tablet by mouth daily. 90 tablet 2   Calcium Carb-Cholecalciferol (CALCIUM PLUS VITAMIN D3) 600-500 MG-UNIT CAPS Take 1 tablet by mouth daily. (Patient not taking: Reported on 11/03/2022)     sertraline (ZOLOFT) 100 MG tablet Take 150 mg by mouth daily. (Patient not taking: Reported on 11/03/2022)     No current facility-administered medications for this visit.    Physical Exam: Vitals:   11/03/22 1054  BP: (!) 158/80  Pulse: 74  SpO2: 93%  Weight: 145 lb 9.6 oz (66 kg)  Height: '5\' 3"'$  (1.6 m)    Gen: Appears comfortable, well-nourished CV: RRR, no dependent edema The device site is normal -- no tenderness, edema, drainage, redness, threatened erosion. Pulm: breathing easily   Pacemaker interrogation- reviewed in detail today,  See PACEART report  ekg tracing ordered today is personally reviewed and shows sinus  Assessment and Plan:  1. 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 HTN Will refill metoprolol   3. OSA Compliance with therapy is advised  Return in a year  Melida Quitter, MD 11/03/2022 11:01 AM

## 2022-11-03 NOTE — Addendum Note (Signed)
Addended by: Laurine Blazer on: 11/03/2022 11:18 AM   Modules accepted: Orders

## 2022-11-07 DIAGNOSIS — D485 Neoplasm of uncertain behavior of skin: Secondary | ICD-10-CM | POA: Diagnosis not present

## 2022-11-07 DIAGNOSIS — Z299 Encounter for prophylactic measures, unspecified: Secondary | ICD-10-CM | POA: Diagnosis not present

## 2022-11-07 DIAGNOSIS — I10 Essential (primary) hypertension: Secondary | ICD-10-CM | POA: Diagnosis not present

## 2022-11-07 DIAGNOSIS — C44319 Basal cell carcinoma of skin of other parts of face: Secondary | ICD-10-CM | POA: Diagnosis not present

## 2022-12-06 DIAGNOSIS — R0602 Shortness of breath: Secondary | ICD-10-CM | POA: Diagnosis not present

## 2022-12-11 DIAGNOSIS — Z299 Encounter for prophylactic measures, unspecified: Secondary | ICD-10-CM | POA: Diagnosis not present

## 2022-12-11 DIAGNOSIS — I700 Atherosclerosis of aorta: Secondary | ICD-10-CM | POA: Diagnosis not present

## 2022-12-11 DIAGNOSIS — I10 Essential (primary) hypertension: Secondary | ICD-10-CM | POA: Diagnosis not present

## 2022-12-11 DIAGNOSIS — N1831 Chronic kidney disease, stage 3a: Secondary | ICD-10-CM | POA: Diagnosis not present

## 2022-12-21 ENCOUNTER — Ambulatory Visit: Payer: Medicare Other

## 2022-12-21 DIAGNOSIS — I441 Atrioventricular block, second degree: Secondary | ICD-10-CM

## 2022-12-21 LAB — CUP PACEART REMOTE DEVICE CHECK
Battery Remaining Longevity: 119 mo
Battery Voltage: 3.01 V
Brady Statistic AP VP Percent: 33.87 %
Brady Statistic AP VS Percent: 6.25 %
Brady Statistic AS VP Percent: 5.14 %
Brady Statistic AS VS Percent: 54.73 %
Brady Statistic RA Percent Paced: 40.14 %
Brady Statistic RV Percent Paced: 39.01 %
Date Time Interrogation Session: 20240306224409
Implantable Lead Connection Status: 753985
Implantable Lead Connection Status: 753985
Implantable Lead Implant Date: 20210909
Implantable Lead Implant Date: 20210909
Implantable Lead Location: 753859
Implantable Lead Location: 753860
Implantable Lead Model: 3830
Implantable Lead Model: 5076
Implantable Pulse Generator Implant Date: 20210909
Lead Channel Impedance Value: 323 Ohm
Lead Channel Impedance Value: 342 Ohm
Lead Channel Impedance Value: 380 Ohm
Lead Channel Impedance Value: 418 Ohm
Lead Channel Pacing Threshold Amplitude: 0.75 V
Lead Channel Pacing Threshold Amplitude: 0.75 V
Lead Channel Pacing Threshold Pulse Width: 0.4 ms
Lead Channel Pacing Threshold Pulse Width: 0.4 ms
Lead Channel Sensing Intrinsic Amplitude: 1.625 mV
Lead Channel Sensing Intrinsic Amplitude: 1.625 mV
Lead Channel Sensing Intrinsic Amplitude: 8.125 mV
Lead Channel Sensing Intrinsic Amplitude: 8.125 mV
Lead Channel Setting Pacing Amplitude: 2 V
Lead Channel Setting Pacing Amplitude: 2.5 V
Lead Channel Setting Pacing Pulse Width: 0.4 ms
Lead Channel Setting Sensing Sensitivity: 0.9 mV
Zone Setting Status: 755011

## 2022-12-22 DIAGNOSIS — G4730 Sleep apnea, unspecified: Secondary | ICD-10-CM | POA: Diagnosis not present

## 2022-12-22 DIAGNOSIS — G4733 Obstructive sleep apnea (adult) (pediatric): Secondary | ICD-10-CM | POA: Diagnosis not present

## 2022-12-26 DIAGNOSIS — Z0000 Encounter for general adult medical examination without abnormal findings: Secondary | ICD-10-CM | POA: Diagnosis not present

## 2022-12-26 DIAGNOSIS — I10 Essential (primary) hypertension: Secondary | ICD-10-CM | POA: Diagnosis not present

## 2022-12-26 DIAGNOSIS — I700 Atherosclerosis of aorta: Secondary | ICD-10-CM | POA: Diagnosis not present

## 2022-12-26 DIAGNOSIS — Z299 Encounter for prophylactic measures, unspecified: Secondary | ICD-10-CM | POA: Diagnosis not present

## 2022-12-26 DIAGNOSIS — Z7189 Other specified counseling: Secondary | ICD-10-CM | POA: Diagnosis not present

## 2023-01-13 DIAGNOSIS — I10 Essential (primary) hypertension: Secondary | ICD-10-CM | POA: Diagnosis not present

## 2023-01-23 NOTE — Progress Notes (Signed)
Remote pacemaker transmission.   

## 2023-02-13 DIAGNOSIS — I10 Essential (primary) hypertension: Secondary | ICD-10-CM | POA: Diagnosis not present

## 2023-02-28 DIAGNOSIS — M6530 Trigger finger, unspecified finger: Secondary | ICD-10-CM | POA: Diagnosis not present

## 2023-02-28 DIAGNOSIS — M797 Fibromyalgia: Secondary | ICD-10-CM | POA: Diagnosis not present

## 2023-02-28 DIAGNOSIS — R682 Dry mouth, unspecified: Secondary | ICD-10-CM | POA: Diagnosis not present

## 2023-02-28 DIAGNOSIS — M351 Other overlap syndromes: Secondary | ICD-10-CM | POA: Diagnosis not present

## 2023-02-28 DIAGNOSIS — I7300 Raynaud's syndrome without gangrene: Secondary | ICD-10-CM | POA: Diagnosis not present

## 2023-03-06 ENCOUNTER — Encounter: Payer: Self-pay | Admitting: *Deleted

## 2023-03-06 ENCOUNTER — Telehealth: Payer: Self-pay | Admitting: Cardiology

## 2023-03-06 DIAGNOSIS — Z299 Encounter for prophylactic measures, unspecified: Secondary | ICD-10-CM | POA: Diagnosis not present

## 2023-03-06 DIAGNOSIS — Z0000 Encounter for general adult medical examination without abnormal findings: Secondary | ICD-10-CM | POA: Diagnosis not present

## 2023-03-06 DIAGNOSIS — I10 Essential (primary) hypertension: Secondary | ICD-10-CM | POA: Diagnosis not present

## 2023-03-06 DIAGNOSIS — M8580 Other specified disorders of bone density and structure, unspecified site: Secondary | ICD-10-CM | POA: Diagnosis not present

## 2023-03-06 DIAGNOSIS — E7800 Pure hypercholesterolemia, unspecified: Secondary | ICD-10-CM | POA: Diagnosis not present

## 2023-03-06 DIAGNOSIS — E559 Vitamin D deficiency, unspecified: Secondary | ICD-10-CM | POA: Diagnosis not present

## 2023-03-06 DIAGNOSIS — Z79899 Other long term (current) drug therapy: Secondary | ICD-10-CM | POA: Diagnosis not present

## 2023-03-06 DIAGNOSIS — R5383 Other fatigue: Secondary | ICD-10-CM | POA: Diagnosis not present

## 2023-03-06 NOTE — Telephone Encounter (Signed)
Pt c/o BP issue: STAT if pt c/o blurred vision, one-sided weakness or slurred speech  1. What are your last 5 BP readings?   No  2. Are you having any other symptoms (ex. Dizziness, headache, blurred vision, passed out)?   No  3. What is your BP issue?   Patient stated her BP has been running higher than normal.  Patient noted her metoprolol succinate (TOPROL-XL) 50 MG 24 hr tablet has been making her feel exhausted most of the time.  Patient states her BP has been running high but her HR is staying down

## 2023-03-06 NOTE — Telephone Encounter (Signed)
Spoke with patient regarding BP readings & stated they are done on monitor & sent to pcp daily.  She could only give today - 144/75.  No c/o chest pain.  Does c/o SOB but says this has been the same for the last year.  Did see her pcp today & was told to increase her Valsartan to 320mg  daily but states she did not feel comfortable making that change without Dr. Wyline Mood input.  PCP also suggested that she change her Toprol to 25mg  every morning & 50mg  every evening.  States that she stays so tired and fatigued all the time.  Would really like to come off of the Toprol at some point.  Will request BP readings from pcp - explained to patient that it would be difficult to give advice on medication changes without having more BP readings to go off of.  Will also send note to device clinic for remote check since she feels that her HR's have been more elevated.  OV scheduled with Sharlene Dory, NP on 03/14/2023 to sort this out.  Message sent to provider in case he may have any other suggestions in the meantime.

## 2023-03-07 NOTE — Telephone Encounter (Signed)
Patient informed and verbalized understanding of plan. Also advised to send PPM transmission

## 2023-03-07 NOTE — Telephone Encounter (Signed)
A lot to sort through. Lets see what device check shows. Could try taking toprol at night for time being. Can hold on increasing vaslartan, can discuss at her upcoming appt  Dominga Ferry MD

## 2023-03-07 NOTE — Telephone Encounter (Signed)
Transmission received   Normal Device Function   4 episodes of SVT since 12/20/22, longest episode 68min51sec. All episodes occurred on 03/04/23. V rates 60-90bpm 90% of the time.

## 2023-03-07 NOTE — Telephone Encounter (Signed)
LVM for patient to call back so we can get a transmission left DC direct number

## 2023-03-08 ENCOUNTER — Ambulatory Visit: Payer: Medicare Other | Attending: Nurse Practitioner | Admitting: Nurse Practitioner

## 2023-03-08 ENCOUNTER — Encounter: Payer: Self-pay | Admitting: Nurse Practitioner

## 2023-03-08 VITALS — BP 180/100 | HR 60 | Ht 63.0 in | Wt 146.6 lb

## 2023-03-08 DIAGNOSIS — I471 Supraventricular tachycardia, unspecified: Secondary | ICD-10-CM | POA: Diagnosis not present

## 2023-03-08 DIAGNOSIS — I1A0 Resistant hypertension: Secondary | ICD-10-CM

## 2023-03-08 DIAGNOSIS — N183 Chronic kidney disease, stage 3 unspecified: Secondary | ICD-10-CM | POA: Diagnosis not present

## 2023-03-08 DIAGNOSIS — Z950 Presence of cardiac pacemaker: Secondary | ICD-10-CM

## 2023-03-08 DIAGNOSIS — G4733 Obstructive sleep apnea (adult) (pediatric): Secondary | ICD-10-CM | POA: Diagnosis not present

## 2023-03-08 DIAGNOSIS — Z79899 Other long term (current) drug therapy: Secondary | ICD-10-CM | POA: Diagnosis not present

## 2023-03-08 DIAGNOSIS — R001 Bradycardia, unspecified: Secondary | ICD-10-CM

## 2023-03-08 MED ORDER — VALSARTAN-HYDROCHLOROTHIAZIDE 320-25 MG PO TABS
1.0000 | ORAL_TABLET | Freq: Every day | ORAL | 3 refills | Status: DC
Start: 2023-03-08 — End: 2023-04-20

## 2023-03-08 NOTE — Progress Notes (Signed)
Office Visit    Patient Name: Susan Hicks Date of Encounter: 03/07/3033 PCP:  Ignatius Specking, MD   [facility]  Cardiologist:  Dina Rich, MD  Advanced Practice Provider:  No care team member to display Electrophysiologist:  Maurice Small, MD   Chief Complaint    Susan Hicks is a 77 y.o. female with a hx of symptomatic bradycardia, s/p PPM in 2021, OSA, hypertension, hyperlipidemia, CKD stage 3, stage who presents today for blood pressure follow-up.    Past Medical History    Past Medical History:  Diagnosis Date   Depression    Fibromyalgia    now considered mixed connective tissue disorder   Generalized anxiety disorder 08/21/2018   GERD (gastroesophageal reflux disease)    Hypertension    Irritable bowel    Junctional bradycardia    a. 2017 - beta blocker discontinued.   LBBB (left bundle branch block)    Mixed connective tissue disease (HCC)    Obstructive sleep apnea treated with continuous positive airway pressure (CPAP) 08/21/2018   Osteoporosis    Premature atrial contractions    Primary localized osteoarthritis of right knee    Past Surgical History:  Procedure Laterality Date   BACK SURGERY     BREAST CYST EXCISION     CHOLECYSTECTOMY     HAND SURGERY  2006   PACEMAKER IMPLANT N/A 06/24/2020   Procedure: PACEMAKER IMPLANT;  Surgeon: Hillis Range, MD;  Location: [facility];  Service: Cardiovascular;  Laterality: N/A;   TOTAL KNEE ARTHROPLASTY Right 09/02/2018   Procedure: TOTAL KNEE ARTHROPLASTY;  Surgeon: Salvatore Marvel, MD;  Location: [facility] OR;  Service: Orthopedics;  Laterality: Right;    Allergies  Allergies  Allergen Reactions   Lisinopril Cough   Beta Adrenergic Blockers Other (See Comments)    Bradycardia - bottomed out heart rate    Gluten Meal Diarrhea    cramps   Penicillins Hives   Norvasc [Amlodipine] Other (See Comments)    HEADACHE     History of Present Illness    Susan Hicks is a 77 y.o. female with a PMH as mentioned above.  Was evaluated at [facility] ED in August 2023 for elevated blood pressure, also noted fatigue and chest discomfort.  Troponins negative.  BNP normal.  D-dimer mildly elevated at 0.79.  Chest x-ray negative.  CTA chest was negative for PE or anything acute.  Was noted to have large bilateral renal cysts with outpatient renal ultrasound recommended.  Seen by Randall An, PA-C on May 24, 2022.  She noted progressive DOE, decreased energy times several months.  Denied any active chest pain.  Coronary CTA arranged-that revealed coronary calcium score of 19, minimal nonobstructive CAD, also noted minimal subsegmental atelectasis in lingula.  Seen by Dr. Nelly Laurence in January 2024.  Was doing well at the time.  Today she presents for follow-up.  She states her blood pressure is not well controlled despite compliance with medications. Limits salt and denies any caffeine intake/energy drink consumption. Sleeps well and compliant with CPAP for her OSA. Denies any chest pain, shortness of breath, palpitations, syncope, presyncope, dizziness, orthopnea, PND, swelling or significant weight changes, acute bleeding, or claudication. Says she is fatigued that she believes is d/t Metoprolol, but medication is helping her she says.   Previous Antihypertensives:  Lisinopril (Cough) Beta blockers (bradycardia?) Norvasc (Headache)  EKGs/Labs/Other Studies Reviewed:   The following studies were reviewed today:   EKG:  EKG is not ordered today.   CCTA 05/2022:  IMPRESSION: 1. Minimal nonobstructive CAD, CADRADS = 1. Trivial ostial left main calcified plaque. At the mid portion, the RCA is not well visualized due to interfering artifact from pacer wires. Appears to taper due to nondominance, but cannot exclude stenosis due to artifact   2. Coronary calcium score of 19. This was 50th percentile for age and sex matched control. Due to artifact  from pacer wires, unable to evaluate calcium in the RCA   3. Normal coronary origin with left dominance.   INTERPRETATION:   CAD-RADS 1: Minimal non-obstructive CAD (0-24%). Consider non-atherosclerotic causes of chest pain. Consider preventive therapy and risk factor modification.  IMPRESSION: Minimal subsegmental atelectasis in lingula.   Otherwise negative exam.  Echo 05/2020:  1. Left ventricular ejection fraction, by estimation, is 55 to 60%. The  left ventricle has normal function. The left ventricle has no regional  wall motion abnormalities. Left ventricular diastolic parameters are  consistent with Grade I diastolic  dysfunction (impaired relaxation). Elevated left ventricular end-diastolic  pressure. The average left ventricular global longitudinal strain is -22.9  %. The global longitudinal strain is normal.   2. Right ventricular systolic function is normal. The right ventricular  size is normal. There is normal pulmonary artery systolic pressure. The  estimated right ventricular systolic pressure is 24.5 mmHg.   3. The mitral valve is abnormal. Trivial mitral valve regurgitation.   4. The aortic valve is tricuspid. Aortic valve regurgitation is not  visualized. Mild aortic valve sclerosis is present, with no evidence of  aortic valve stenosis.   5. The inferior vena cava is normal in size with greater than 50%  respiratory variability, suggesting right atrial pressure of 3 mmHg.  Recent Labs: No results found for requested labs within last 365 days.  Recent Lipid Panel    Component Value Date/Time   CHOL 203 (H) 04/13/2016 1102   TRIG 94 04/13/2016 1102   HDL 59 04/13/2016 1102   CHOLHDL 3.4 04/13/2016 1102   VLDL 19 04/13/2016 1102   LDLCALC 125 (H) 04/13/2016 1102     Home Medications   Current Meds  Medication Sig   acetaminophen (TYLENOL) 500 MG tablet Take 500 mg by mouth at bedtime as needed for moderate pain.    albuterol (VENTOLIN HFA) 108 (90  Base) MCG/ACT inhaler Inhale 1-2 puffs into the lungs as needed for wheezing or shortness of breath.   Artificial Tear Solution (SOOTHE XP OP) Place 1 drop into both eyes in the morning and at bedtime.   b complex vitamins tablet Take 1 tablet by mouth daily.   busPIRone (BUSPAR) 15 MG tablet Take 15 mg by mouth 2 (two) times daily.   cetirizine (ZYRTEC) 10 MG tablet Take 10 mg by mouth daily.   chlorpheniramine (CHLOR-TRIMETON) 4 MG tablet Take 4 mg by mouth at bedtime as needed for allergies.   Cholecalciferol (VITAMIN D3) 2000 units TABS Take 2,000 Units by mouth daily.    cycloSPORINE (RESTASIS) 0.05 % ophthalmic emulsion 1 drop 2 (two) times daily.   diphenhydramine-acetaminophen (TYLENOL PM) 25-500 MG TABS tablet Take 1 tablet by mouth at bedtime.    escitalopram (LEXAPRO) 20 MG tablet Take 20 mg by mouth daily.   famotidine (PEPCID) 20 MG tablet Take 20 mg by mouth at bedtime.   leflunomide (ARAVA) 20 MG tablet Take 20 mg by mouth daily.   LORazepam (ATIVAN) 0.5 MG tablet Take 0.5 mg by mouth at bedtime.  Magnesium 400 MG CAPS Take 400 mg by mouth at bedtime.   melatonin 5 MG TABS Take 5 mg by mouth at bedtime.   meloxicam (MOBIC) 7.5 MG tablet Take 7.5 mg by mouth in the morning and at bedtime.   metoprolol succinate (TOPROL-XL) 50 MG 24 hr tablet Take 1 tablet (50 mg total) by mouth daily. Take with or immediately following a meal.   Misc Natural Products (BEET ROOT) 500 MG CAPS Take 1 capsule by mouth daily.   Multiple Vitamins-Minerals (ADULT GUMMY) CHEW Chew 2 tablets by mouth daily.   Multiple Vitamins-Minerals (PRESERVISION AREDS PO) Take 1 tablet by mouth daily.   oxybutynin (DITROPAN) 5 MG tablet Take 5 mg by mouth daily.   pantoprazole (PROTONIX) 40 MG tablet Take 40 mg by mouth daily.   PRESCRIPTION MEDICATION Inhale into the lungs at bedtime. CPAP   Psyllium (METAMUCIL FIBER PO) Take 4 capsules by mouth daily.   rosuvastatin (CRESTOR) 5 MG tablet Take 5 mg by mouth once a  week.   traMADol (ULTRAM) 50 MG tablet Take 50 mg by mouth every 6 (six) hours as needed for moderate pain (hip / back pain).   Turmeric Curcumin 500 MG CAPS Take 500 mg by mouth at bedtime.   valsartan-hydrochlorothiazide (DIOVAN HCT) 160-25 MG tablet Take 1 tablet by mouth daily.     Review of Systems    All other systems reviewed and are otherwise negative except as noted above.  Physical Exam    VS:  BP (!) 180/100 (BP Location: Right Arm, Cuff Size: Normal)   Pulse 60   Ht 5\' 3"  (1.6 m)   Wt 146 lb 9.6 oz (66.5 kg)   SpO2 97%   BMI 25.97 kg/m  , BMI Body mass index is 25.97 kg/m.  Wt Readings from Last 3 Encounters:  03/08/23 146 lb 9.6 oz (66.5 kg)  11/03/22 145 lb 9.6 oz (66 kg)  06/13/22 141 lb 6.4 oz (64.1 kg)    Repeat BP: 187/96  GEN: Well nourished, well developed, in no acute distress. HEENT: normal. Neck: Supple, no JVD, carotid bruits, or masses. Cardiac: S1/S2, RRR, no murmurs, rubs, or gallops. No clubbing, cyanosis, edema.  Radials/PT 2+ and equal bilaterally.  Respiratory:  Respirations regular and unlabored, clear to auscultation bilaterally. MS: No deformity or atrophy. Skin: Warm and dry, no rash. Neuro:  Strength and sensation are intact. Psych: Normal affect.  Assessment & Plan    Resistant HTN, medication management Poorly controlled BP, not in acute distress. Unable to tolerate Lisinopril and Norvasc - see allergy list. Does have reported intolerance to beta blockers as these caused bradycardia- however she remains on Toprol XL with HR WNL, does note minimal fatigue. Denies any excess salt intake, caffeine/energy drink consumption. Current GDMT: Toprol XL 50 mg daily and Diovan-HCT (160/25 mg tablet) daily. Will increase valsartan to 320 mg daily and repeat BMET in 1 week. Will arrange renal artery ultrasound to evaluate for RAS. Discussed to monitor BP at home at least 2 hours after medications and sitting for 5-10 minutes. Salty six diet sheet  given. Will refer to pharm D Advanced HTN clinic to be seen soon for follow-up. ED precautions discussed. Compliant with CPAP usage.   2. Symptomatic bradycardia, s/p PPM in 2021, SVT HR well controlled. Remote device check 12/2022 revealed 3 SVT episodes, longest duration was less than 2 minutes, normal device function noted. Denies any symptoms. Continue to follow-up with EP.   3. OSA on CPAP Encouraged continued  compliance.   4. CKD stage 3 Most recent sCr was 1.33 with eGFR 41. Will repeat BMET in 1 week as mentioned above. Avoid nephrotoxic agents. Continue to follow with PCP.  Disposition: Follow up in 6-8 week(s) with Dina Rich, MD or APP.  Signed, Sharlene Dory, NP 03/09/2023, 10:45 AM [facility]

## 2023-03-08 NOTE — Patient Instructions (Addendum)
Medication Instructions:  Your physician has recommended you make the following change in your medication: Increased Valsartan 320mg  daily. All other medications remain the same,   Labwork: BMET in 1 week 05/30/204 at [facility]   Testing/Procedures: Your physician has requested that you have a renal artery duplex. During this test, an ultrasound is used to evaluate blood flow to the kidneys. Allow one hour for this exam. Do not eat after midnight the day before and avoid carbonated beverages. Take your medications as you usually do.   Follow-Up: Your physician recommends that you schedule a follow-up appointment in: 6-8 week   Any Other Special Instructions Will Be Listed Below (If Applicable). You have been referred to Pharm D Your physician has requested that you regularly monitor and record your blood pressure readings at home. Please use the same machine at the same time of day to check your readings and record them to bring to your follow-up visit.      If you need a refill on your cardiac medications before your next appointment, please call your pharmacy.

## 2023-03-08 NOTE — Telephone Encounter (Signed)
Nothing worrisome on device check. Very few very short episodes of an abnormal rhythm called an svt which is not dangerous. Heart rates overally well contorlled. Discuss at her upcoming f/u  Dominga Ferry MD

## 2023-03-14 ENCOUNTER — Other Ambulatory Visit: Payer: Self-pay | Admitting: *Deleted

## 2023-03-14 ENCOUNTER — Ambulatory Visit: Payer: Medicare Other | Admitting: Nurse Practitioner

## 2023-03-14 DIAGNOSIS — I1A0 Resistant hypertension: Secondary | ICD-10-CM

## 2023-03-14 NOTE — Addendum Note (Signed)
Addended by: Eustace Moore on: 03/14/2023 08:37 AM   Modules accepted: Orders

## 2023-03-15 DIAGNOSIS — I1A0 Resistant hypertension: Secondary | ICD-10-CM | POA: Diagnosis not present

## 2023-03-15 DIAGNOSIS — Z79899 Other long term (current) drug therapy: Secondary | ICD-10-CM | POA: Diagnosis not present

## 2023-03-16 ENCOUNTER — Telehealth: Payer: Self-pay | Admitting: *Deleted

## 2023-03-16 DIAGNOSIS — I10 Essential (primary) hypertension: Secondary | ICD-10-CM | POA: Diagnosis not present

## 2023-03-16 NOTE — Telephone Encounter (Signed)
Left message to return call 

## 2023-03-16 NOTE — Telephone Encounter (Signed)
FW: Scan Received: Jaclyn Prime, NP  Lesle Chris, LPN Kidney function at baseline.  No changes to medication regimen at this time.  Thanks!  Best, Sharlene Dory, NP       Previous Messages    ----- Message ----- From: Aurora Mask Sent: 03/15/2023   4:53 PM EDT To: Sharlene Dory, NP Subject: Scan                                          Scanned by Aurora Mask on 03/15/2023 at  4:53 PM to the following: Basic metabolic panel [[id number]] on 01/22/8118

## 2023-03-22 ENCOUNTER — Ambulatory Visit (INDEPENDENT_AMBULATORY_CARE_PROVIDER_SITE_OTHER): Payer: Medicare Other

## 2023-03-22 DIAGNOSIS — I441 Atrioventricular block, second degree: Secondary | ICD-10-CM

## 2023-03-22 LAB — CUP PACEART REMOTE DEVICE CHECK
Battery Remaining Longevity: 117 mo
Battery Voltage: 3.01 V
Brady Statistic AP VP Percent: 47.88 %
Brady Statistic AP VS Percent: 4.17 %
Brady Statistic AS VP Percent: 2.24 %
Brady Statistic AS VS Percent: 45.71 %
Brady Statistic RA Percent Paced: 52 %
Brady Statistic RV Percent Paced: 50.12 %
Date Time Interrogation Session: 20240605221548
Implantable Lead Connection Status: 753985
Implantable Lead Connection Status: 753985
Implantable Lead Implant Date: 20210909
Implantable Lead Implant Date: 20210909
Implantable Lead Location: 753859
Implantable Lead Location: 753860
Implantable Lead Model: 3830
Implantable Lead Model: 5076
Implantable Pulse Generator Implant Date: 20210909
Lead Channel Impedance Value: 342 Ohm
Lead Channel Impedance Value: 380 Ohm
Lead Channel Impedance Value: 418 Ohm
Lead Channel Impedance Value: 456 Ohm
Lead Channel Pacing Threshold Amplitude: 0.5 V
Lead Channel Pacing Threshold Amplitude: 0.625 V
Lead Channel Pacing Threshold Pulse Width: 0.4 ms
Lead Channel Pacing Threshold Pulse Width: 0.4 ms
Lead Channel Sensing Intrinsic Amplitude: 12.5 mV
Lead Channel Sensing Intrinsic Amplitude: 12.5 mV
Lead Channel Sensing Intrinsic Amplitude: 2.125 mV
Lead Channel Sensing Intrinsic Amplitude: 2.125 mV
Lead Channel Setting Pacing Amplitude: 2 V
Lead Channel Setting Pacing Amplitude: 2.5 V
Lead Channel Setting Pacing Pulse Width: 0.4 ms
Lead Channel Setting Sensing Sensitivity: 0.9 mV
Zone Setting Status: 755011

## 2023-03-23 DIAGNOSIS — R5383 Other fatigue: Secondary | ICD-10-CM | POA: Diagnosis not present

## 2023-03-23 DIAGNOSIS — Z299 Encounter for prophylactic measures, unspecified: Secondary | ICD-10-CM | POA: Diagnosis not present

## 2023-03-23 DIAGNOSIS — I10 Essential (primary) hypertension: Secondary | ICD-10-CM | POA: Diagnosis not present

## 2023-03-27 ENCOUNTER — Encounter: Payer: Self-pay | Admitting: *Deleted

## 2023-03-27 DIAGNOSIS — G4730 Sleep apnea, unspecified: Secondary | ICD-10-CM | POA: Diagnosis not present

## 2023-03-27 DIAGNOSIS — G4733 Obstructive sleep apnea (adult) (pediatric): Secondary | ICD-10-CM | POA: Diagnosis not present

## 2023-03-27 NOTE — Telephone Encounter (Signed)
Patient notified via letter

## 2023-04-03 ENCOUNTER — Ambulatory Visit (HOSPITAL_COMMUNITY)
Admission: RE | Admit: 2023-04-03 | Discharge: 2023-04-03 | Disposition: A | Discharge status: Home / Self Care | Payer: Medicare Other | Source: Ambulatory Visit | Attending: Nurse Practitioner | Admitting: Nurse Practitioner

## 2023-04-03 DIAGNOSIS — I1A0 Resistant hypertension: Secondary | ICD-10-CM

## 2023-04-06 ENCOUNTER — Telehealth: Payer: Self-pay | Admitting: *Deleted

## 2023-04-06 NOTE — Telephone Encounter (Signed)
-----   Message from Sharlene Dory, NP sent at 04/04/2023  2:24 PM EDT ----- Normal study. Great result!  Sharlene Dory, AGNP-C

## 2023-04-06 NOTE — Telephone Encounter (Signed)
  Lesle Chris, LPN 0/45/4098 11:91 AM EDT Back to Top    Notified, copy to pcp.

## 2023-04-13 NOTE — Progress Notes (Signed)
Remote pacemaker transmission.   

## 2023-04-15 DIAGNOSIS — I10 Essential (primary) hypertension: Secondary | ICD-10-CM | POA: Diagnosis not present

## 2023-04-20 ENCOUNTER — Ambulatory Visit: Payer: Medicare Other | Attending: Nurse Practitioner | Admitting: Nurse Practitioner

## 2023-04-20 ENCOUNTER — Encounter: Payer: Self-pay | Admitting: Nurse Practitioner

## 2023-04-20 VITALS — BP 130/76 | HR 70 | Ht 63.0 in | Wt 147.0 lb

## 2023-04-20 DIAGNOSIS — R062 Wheezing: Secondary | ICD-10-CM | POA: Diagnosis not present

## 2023-04-20 DIAGNOSIS — I10 Essential (primary) hypertension: Secondary | ICD-10-CM

## 2023-04-20 DIAGNOSIS — R001 Bradycardia, unspecified: Secondary | ICD-10-CM | POA: Diagnosis not present

## 2023-04-20 DIAGNOSIS — Z950 Presence of cardiac pacemaker: Secondary | ICD-10-CM | POA: Diagnosis not present

## 2023-04-20 DIAGNOSIS — G4733 Obstructive sleep apnea (adult) (pediatric): Secondary | ICD-10-CM | POA: Diagnosis not present

## 2023-04-20 DIAGNOSIS — I471 Supraventricular tachycardia, unspecified: Secondary | ICD-10-CM

## 2023-04-20 DIAGNOSIS — N183 Chronic kidney disease, stage 3 unspecified: Secondary | ICD-10-CM | POA: Diagnosis not present

## 2023-04-20 MED ORDER — METOPROLOL SUCCINATE ER 50 MG PO TB24: 50.0000 mg | ORAL_TABLET | Freq: Every day | ORAL | 3 refills | Status: DC

## 2023-04-20 NOTE — Patient Instructions (Signed)
Medication Instructions:  Your physician recommends that you continue on your current medications as directed. Please refer to the Current Medication list given to you today.  Labwork: none  Testing/Procedures: none  Follow-Up: Your physician recommends that you schedule a follow-up appointment in: 1 Year with Branch  Any Other Special Instructions Will Be Listed Below (If Applicable).  If you need a refill on your cardiac medications before your next appointment, please call your pharmacy. 

## 2023-04-20 NOTE — Progress Notes (Signed)
Cardiology Office Note:  .   Date:  04/20/2023  ID:  [name], DOB June 07, 1946, MRN [id number] PCP: Ignatius Specking, MD  [facility] Providers Cardiologist:  Dina Rich, MD Electrophysiologist:  Maurice Small, MD    History of Present Illness: .   Susan Hicks is a 77 y.o. female with a PMH of symptomatic bradycardia, s/p PPM in 2021, OSA on CPAP, hypertension, hyperlipidemia, and CKD stage III , who presents today for scheduled follow-up.  I last saw patient on Mar 08, 2023.  Blood pressure was not well-controlled despite compliance with medications.  Was compliant with her CPAP for OSA.  Patient endorsed fatigue that she felt was attributed to metoprolol.  Valsartan was increased to 320 mg daily. No RAS on imaging.  Was referred to Pharm.D. advanced hypertension clinic.  Today she presents for follow-up.  She states BP well controlled today after PCP made recent adjustments with her medications. Shows me BP log that shows elevated readings, she wonders if this is d/t anxiety with having to check her BP every day. Denies any chest pain, shortness of breath, palpitations, syncope, presyncope, dizziness, orthopnea, PND, swelling or significant weight changes, acute bleeding, or claudication. Does admit to wheezing at night.   Previous Antihypertensives:  Lisinopril (Cough) Norvasc (Headache)  Studies Reviewed: Marland Kitchen   CCTA 05/2022:  IMPRESSION: 1. Minimal nonobstructive CAD, CADRADS = 1. Trivial ostial left main calcified plaque. At the mid portion, the RCA is not well visualized due to interfering artifact from pacer wires. Appears to taper due to nondominance, but cannot exclude stenosis due to artifact   2. Coronary calcium score of 19. This was 50th percentile for age and sex matched control. Due to artifact from pacer wires, unable to evaluate calcium in the RCA   3. Normal coronary origin with left dominance.   INTERPRETATION:   CAD-RADS 1: Minimal  non-obstructive CAD (0-24%). Consider non-atherosclerotic causes of chest pain. Consider preventive therapy and risk factor modification.   IMPRESSION: Minimal subsegmental atelectasis in lingula.   Otherwise negative exam.   Echo 05/2020:  1. Left ventricular ejection fraction, by estimation, is 55 to 60%. The  left ventricle has normal function. The left ventricle has no regional  wall motion abnormalities. Left ventricular diastolic parameters are  consistent with Grade I diastolic  dysfunction (impaired relaxation). Elevated left ventricular end-diastolic  pressure. The average left ventricular global longitudinal strain is -22.9  %. The global longitudinal strain is normal.   2. Right ventricular systolic function is normal. The right ventricular  size is normal. There is normal pulmonary artery systolic pressure. The  estimated right ventricular systolic pressure is 24.5 mmHg.   3. The mitral valve is abnormal. Trivial mitral valve regurgitation.   4. The aortic valve is tricuspid. Aortic valve regurgitation is not  visualized. Mild aortic valve sclerosis is present, with no evidence of  aortic valve stenosis.   5. The inferior vena cava is normal in size with greater than 50%  respiratory variability, suggesting right atrial pressure of 3 mmHg.  Physical Exam:   VS:  BP 130/76   Pulse 70   Ht 5\' 3"  (1.6 m)   Wt 147 lb (66.7 kg)   SpO2 95%   BMI 26.04 kg/m    Wt Readings from Last 3 Encounters:  04/20/23 147 lb (66.7 kg)  03/08/23 146 lb 9.6 oz (66.5 kg)  11/03/22 145 lb 9.6 oz (66 kg)    GEN: Well nourished, well developed  in no acute distress NECK: No JVD; No carotid bruits CARDIAC: S1/S2, RRR, no murmurs, rubs, gallops RESPIRATORY:  Clear to auscultation without rales, wheezing or rhonchi  ABDOMEN: Soft, non-tender, non-distended EXTREMITIES:  No edema; No deformity   ASSESSMENT AND PLAN: .    HTN BP stable today. Continue current medication regimen. No RAS  on imaging. Discussed to monitor BP at home after medications and sitting for 5-10 minutes every other day. Salty six diet sheet given. Previously declined pharm D Advanced HTN clinic referral. Continue to follow-up with PCP as he is managing this. Will refill Metoprolol succinate per her request.    2. Symptomatic bradycardia, s/p PPM in 2021, SVT HR well controlled. Remote device check 12/2022 revealed 3 SVT episodes, longest duration was less than 2 minutes, normal device function noted. Denies any symptoms. Continue to follow-up with EP.    3. OSA on CPAP Encouraged continued compliance.    4. CKD stage 3 Most recent sCr was 1.51 with eGFR 36. Avoid nephrotoxic agents. Continue to follow with PCP.  5. Wheezing Etiology most likely d/t pet dander/allergies, as she has recently taken in 2 cats. Discussed care precautions. Continue to follow with PCP.   Dispo: Did discuss 6 month follow-up, however pt is requesting 1 year follow-up. Follow-up with Dr. Dina Rich or APP in 1 year or sooner if anything changes.   Signed, Sharlene Dory, NP

## 2023-05-02 ENCOUNTER — Ambulatory Visit: Payer: Medicare Other | Admitting: Cardiology

## 2023-05-08 DIAGNOSIS — I10 Essential (primary) hypertension: Secondary | ICD-10-CM | POA: Diagnosis not present

## 2023-05-08 DIAGNOSIS — Z299 Encounter for prophylactic measures, unspecified: Secondary | ICD-10-CM | POA: Diagnosis not present

## 2023-05-08 DIAGNOSIS — M351 Other overlap syndromes: Secondary | ICD-10-CM | POA: Diagnosis not present

## 2023-05-16 DIAGNOSIS — I10 Essential (primary) hypertension: Secondary | ICD-10-CM | POA: Diagnosis not present

## 2023-06-16 DIAGNOSIS — I10 Essential (primary) hypertension: Secondary | ICD-10-CM | POA: Diagnosis not present

## 2023-06-21 ENCOUNTER — Ambulatory Visit (INDEPENDENT_AMBULATORY_CARE_PROVIDER_SITE_OTHER): Payer: Medicare Other

## 2023-06-21 DIAGNOSIS — I441 Atrioventricular block, second degree: Secondary | ICD-10-CM | POA: Diagnosis not present

## 2023-06-21 LAB — CUP PACEART REMOTE DEVICE CHECK
Battery Remaining Longevity: 114 mo
Battery Voltage: 3.01 V
Brady Statistic AP VP Percent: 32.47 %
Brady Statistic AP VS Percent: 4.58 %
Brady Statistic AS VP Percent: 1.26 %
Brady Statistic AS VS Percent: 61.69 %
Brady Statistic RA Percent Paced: 37 %
Brady Statistic RV Percent Paced: 33.73 %
Date Time Interrogation Session: 20240904222017
Implantable Lead Connection Status: 753985
Implantable Lead Connection Status: 753985
Implantable Lead Implant Date: 20210909
Implantable Lead Implant Date: 20210909
Implantable Lead Location: 753859
Implantable Lead Location: 753860
Implantable Lead Model: 3830
Implantable Lead Model: 5076
Implantable Pulse Generator Implant Date: 20210909
Lead Channel Impedance Value: 323 Ohm
Lead Channel Impedance Value: 380 Ohm
Lead Channel Impedance Value: 399 Ohm
Lead Channel Impedance Value: 418 Ohm
Lead Channel Pacing Threshold Amplitude: 0.5 V
Lead Channel Pacing Threshold Amplitude: 0.625 V
Lead Channel Pacing Threshold Pulse Width: 0.4 ms
Lead Channel Pacing Threshold Pulse Width: 0.4 ms
Lead Channel Sensing Intrinsic Amplitude: 3.125 mV
Lead Channel Sensing Intrinsic Amplitude: 3.125 mV
Lead Channel Sensing Intrinsic Amplitude: 9.25 mV
Lead Channel Sensing Intrinsic Amplitude: 9.25 mV
Lead Channel Setting Pacing Amplitude: 2 V
Lead Channel Setting Pacing Amplitude: 2.5 V
Lead Channel Setting Pacing Pulse Width: 0.4 ms
Lead Channel Setting Sensing Sensitivity: 0.9 mV
Zone Setting Status: 755011

## 2023-06-28 NOTE — Progress Notes (Signed)
Remote pacemaker transmission.   

## 2023-07-16 DIAGNOSIS — I10 Essential (primary) hypertension: Secondary | ICD-10-CM | POA: Diagnosis not present

## 2023-08-06 DIAGNOSIS — I10 Essential (primary) hypertension: Secondary | ICD-10-CM | POA: Diagnosis not present

## 2023-08-06 DIAGNOSIS — Z23 Encounter for immunization: Secondary | ICD-10-CM | POA: Diagnosis not present

## 2023-08-06 DIAGNOSIS — Z299 Encounter for prophylactic measures, unspecified: Secondary | ICD-10-CM | POA: Diagnosis not present

## 2023-08-15 DIAGNOSIS — I10 Essential (primary) hypertension: Secondary | ICD-10-CM | POA: Diagnosis not present

## 2023-08-30 DIAGNOSIS — R682 Dry mouth, unspecified: Secondary | ICD-10-CM | POA: Diagnosis not present

## 2023-08-30 DIAGNOSIS — M351 Other overlap syndromes: Secondary | ICD-10-CM | POA: Diagnosis not present

## 2023-08-30 DIAGNOSIS — M797 Fibromyalgia: Secondary | ICD-10-CM | POA: Diagnosis not present

## 2023-08-30 DIAGNOSIS — M6530 Trigger finger, unspecified finger: Secondary | ICD-10-CM | POA: Diagnosis not present

## 2023-08-30 DIAGNOSIS — I7300 Raynaud's syndrome without gangrene: Secondary | ICD-10-CM | POA: Diagnosis not present

## 2023-09-10 ENCOUNTER — Telehealth: Payer: Self-pay | Admitting: Cardiovascular Disease

## 2023-09-10 NOTE — Telephone Encounter (Signed)
Patient reports her bedside monitor has a yellow light on it and beeping. [employer] tech services number provided to patient who is agreeable to contact tech services.

## 2023-09-10 NOTE — Telephone Encounter (Signed)
  1. Has your device fired? No   2. Is you device beeping? yes  3. Are you experiencing draining or swelling at device site? No   4. Are you calling to see if we received your device transmission? no  5. Have you passed out? no   Please route to Device Clinic Pool

## 2023-09-14 DIAGNOSIS — I10 Essential (primary) hypertension: Secondary | ICD-10-CM | POA: Diagnosis not present

## 2023-09-20 ENCOUNTER — Ambulatory Visit (INDEPENDENT_AMBULATORY_CARE_PROVIDER_SITE_OTHER): Payer: Medicare Other

## 2023-09-20 DIAGNOSIS — I441 Atrioventricular block, second degree: Secondary | ICD-10-CM | POA: Diagnosis not present

## 2023-09-20 LAB — CUP PACEART REMOTE DEVICE CHECK
Battery Remaining Longevity: 110 mo
Battery Voltage: 3.01 V
Brady Statistic AP VP Percent: 52.94 %
Brady Statistic AP VS Percent: 7.13 %
Brady Statistic AS VP Percent: 1.01 %
Brady Statistic AS VS Percent: 38.92 %
Brady Statistic RA Percent Paced: 59.88 %
Brady Statistic RV Percent Paced: 53.95 %
Date Time Interrogation Session: 20241205003645
Implantable Lead Connection Status: 753985
Implantable Lead Connection Status: 753985
Implantable Lead Implant Date: 20210909
Implantable Lead Implant Date: 20210909
Implantable Lead Location: 753859
Implantable Lead Location: 753860
Implantable Lead Model: 3830
Implantable Lead Model: 5076
Implantable Pulse Generator Implant Date: 20210909
Lead Channel Impedance Value: 304 Ohm
Lead Channel Impedance Value: 323 Ohm
Lead Channel Impedance Value: 380 Ohm
Lead Channel Impedance Value: 418 Ohm
Lead Channel Pacing Threshold Amplitude: 0.5 V
Lead Channel Pacing Threshold Amplitude: 0.75 V
Lead Channel Pacing Threshold Pulse Width: 0.4 ms
Lead Channel Pacing Threshold Pulse Width: 0.4 ms
Lead Channel Sensing Intrinsic Amplitude: 1.25 mV
Lead Channel Sensing Intrinsic Amplitude: 1.25 mV
Lead Channel Sensing Intrinsic Amplitude: 8.25 mV
Lead Channel Sensing Intrinsic Amplitude: 8.25 mV
Lead Channel Setting Pacing Amplitude: 2 V
Lead Channel Setting Pacing Amplitude: 2.5 V
Lead Channel Setting Pacing Pulse Width: 0.4 ms
Lead Channel Setting Sensing Sensitivity: 0.9 mV
Zone Setting Status: 755011

## 2023-10-15 DIAGNOSIS — I10 Essential (primary) hypertension: Secondary | ICD-10-CM | POA: Diagnosis not present

## 2023-10-19 ENCOUNTER — Encounter: Payer: Medicare Other | Admitting: Cardiovascular Disease

## 2023-10-26 ENCOUNTER — Ambulatory Visit: Payer: Medicare Other | Attending: Cardiovascular Disease | Admitting: Cardiovascular Disease

## 2023-10-26 NOTE — Progress Notes (Deleted)
    PCP: Susan Leta NOVAK, MD Primary Cardiologist: Dr Susan Hicks Primary EP:  Dr Hicks  Susan Hicks is a 78 y.o. female who presents today for routine electrophysiology followup.  Since last being seen in our clinic, the patient reports doing very well.  Today, she denies symptoms of palpitations, chest pain, shortness of breath,  lower extremity edema, dizziness, presyncope, or syncope.  The patient is otherwise without complaint today.      Physical Exam: There were no vitals filed for this visit.   Gen: Appears comfortable, well-nourished CV: RRR, no dependent edema The device site is normal -- no tenderness, edema, drainage, redness, threatened erosion. Pulm: breathing easily   Pacemaker interrogation- reviewed in detail today,  See PACEART report  ekg tracing ordered today is personally reviewed and shows sinus  Assessment and Plan:  1. 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 HTN Will refill metoprolol    3. OSA Compliance with therapy is advised  Return in a year  Susan FORBES Nancey, MD 10/26/2023 9:04 AM

## 2023-10-29 ENCOUNTER — Encounter: Payer: Self-pay | Admitting: Cardiovascular Disease

## 2023-11-05 DIAGNOSIS — I10 Essential (primary) hypertension: Secondary | ICD-10-CM | POA: Diagnosis not present

## 2023-11-05 DIAGNOSIS — N182 Chronic kidney disease, stage 2 (mild): Secondary | ICD-10-CM | POA: Diagnosis not present

## 2023-11-05 DIAGNOSIS — R079 Chest pain, unspecified: Secondary | ICD-10-CM | POA: Diagnosis not present

## 2023-11-05 DIAGNOSIS — I129 Hypertensive chronic kidney disease with stage 1 through stage 4 chronic kidney disease, or unspecified chronic kidney disease: Secondary | ICD-10-CM | POA: Diagnosis not present

## 2023-11-05 DIAGNOSIS — R9431 Abnormal electrocardiogram [ECG] [EKG]: Secondary | ICD-10-CM | POA: Diagnosis not present

## 2023-11-05 DIAGNOSIS — I6789 Other cerebrovascular disease: Secondary | ICD-10-CM | POA: Diagnosis not present

## 2023-11-05 DIAGNOSIS — K219 Gastro-esophageal reflux disease without esophagitis: Secondary | ICD-10-CM | POA: Diagnosis not present

## 2023-11-05 DIAGNOSIS — E785 Hyperlipidemia, unspecified: Secondary | ICD-10-CM | POA: Diagnosis not present

## 2023-11-05 DIAGNOSIS — R9082 White matter disease, unspecified: Secondary | ICD-10-CM | POA: Diagnosis not present

## 2023-11-05 DIAGNOSIS — Z79899 Other long term (current) drug therapy: Secondary | ICD-10-CM | POA: Diagnosis not present

## 2023-11-05 DIAGNOSIS — I252 Old myocardial infarction: Secondary | ICD-10-CM | POA: Diagnosis not present

## 2023-11-05 DIAGNOSIS — Z880 Allergy status to penicillin: Secondary | ICD-10-CM | POA: Diagnosis not present

## 2023-11-06 ENCOUNTER — Telehealth: Payer: Self-pay | Admitting: *Deleted

## 2023-11-06 ENCOUNTER — Encounter: Payer: Self-pay | Admitting: *Deleted

## 2023-11-06 NOTE — Patient Outreach (Signed)
Care Coordination   Outreach  Visit Note   11/06/2023 Name: Susan Hicks MRN: [id number] DOB: 1946/09/12  Susan Hicks is a 78 y.o. year old female who sees Vyas, Susan Pih, MD for primary care. I spoke with  Susan Hicks by phone today. Patient presented to [facility] ED yesterday evening for frontal headache and elevated blood pressure. Outreach today was in reference to that visit and to offer Care Management Services.   Susan Hicks was given information about Complex Care Management services today including:   The Complex Care Management services include support from the care team which includes your Nurse Coordinator, Clinical Social Worker, or Pharmacist.  The Complex Care Management team is here to help remove barriers to the health concerns and goals most important to you. Complex Care Management services are voluntary, and the patient may decline or stop services at any time by request to their care team member.   Complex Care Management Consent Status: Patient agreed to services and verbal consent obtained.   What matters to the patients health and wellness today?  Managing headache and blood pressure    Goals Addressed             This Visit's Progress    Manage Hypertension       Care Coordination Goals: Patient will take medications as directed and report any negative side effects to provider  Patient will monitor and record blood pressure daily and as needed and will call PCP or specialist with any readings outside of recommended range Blood pressure readings are transmitted directly to PCP office for review Patient will keep Appt. With PCP on 11/13/23 Patient will schedule a follow-up with cardiologist in [facility] Patient will take blood pressure log to cardiology appointment for review Patient will reach out to RN Care Manager [phone] with any care coordination or resource needs          SDOH assessments and interventions completed:   Yes  SDOH Interventions Today    Flowsheet Row Most Recent Value  SDOH Interventions   Transportation Interventions Intervention Not Indicated        Care Coordination Interventions:  Yes, provided  Interventions Today    Flowsheet Row Most Recent Value  Chronic Disease   Chronic disease during today's visit Hypertension (HTN), Other  [Headache]  General Interventions   General Interventions Discussed/Reviewed General Interventions Discussed, General Interventions Reviewed, Durable Medical Equipment (DME), Doctor Visits, Communication with  Doctor Visits Discussed/Reviewed Doctor Visits Discussed, Doctor Visits Reviewed, PCP, Specialist  Durable Medical Equipment (DME) BP Cuff  [blood pressure today was 162/80. Blood pressure readings are transmitted directly to Dr Sherril Croon office]  PCP/Specialist Visits Compliance with follow-up visit  [F/U with Dr Sherril Croon on 11/13/23 and schedule ED F/U with [facility]]  Communication with PCP/Specialists  [staff message sent to [facility] Scheduling requesting that they reach out to patient to schedule an ED F/U with Dr Wyline Mood or Sharlene Dory, NP]  Education Interventions   Education Provided Provided Education  Provided Verbal Education On Medication, When to see the doctor, Other  [blood pressure monitoring. Use saline nasal spray to help with dry sinsuses due to heat. Seek emergency medical attention if needed.]  Pharmacy Interventions   Pharmacy Dicussed/Reviewed Medications and their functions, Pharmacy Topics Discussed, Pharmacy Topics Reviewed  [Take medications as directed. Missed one dose of Ativan 2 nights ago. Took 1/2 dose of metoprolol prior to ED visit.]       Follow up plan:  call scheduled for 11/13/23 to complete initial visit     Encounter Outcome:  Patient Visit Completed   Demetrios Loll, RN, BSN [place]  [facility], [facility] RN Care Manager Direct Dial: [phone]

## 2023-11-07 DIAGNOSIS — I700 Atherosclerosis of aorta: Secondary | ICD-10-CM | POA: Diagnosis not present

## 2023-11-07 DIAGNOSIS — K589 Irritable bowel syndrome without diarrhea: Secondary | ICD-10-CM | POA: Diagnosis not present

## 2023-11-07 DIAGNOSIS — N1831 Chronic kidney disease, stage 3a: Secondary | ICD-10-CM | POA: Diagnosis not present

## 2023-11-07 DIAGNOSIS — I10 Essential (primary) hypertension: Secondary | ICD-10-CM | POA: Diagnosis not present

## 2023-11-07 DIAGNOSIS — Z299 Encounter for prophylactic measures, unspecified: Secondary | ICD-10-CM | POA: Diagnosis not present

## 2023-11-08 ENCOUNTER — Encounter (INDEPENDENT_AMBULATORY_CARE_PROVIDER_SITE_OTHER): Payer: Self-pay | Admitting: *Deleted

## 2023-11-08 ENCOUNTER — Telehealth: Payer: Self-pay | Admitting: *Deleted

## 2023-11-08 ENCOUNTER — Encounter: Payer: Self-pay | Admitting: *Deleted

## 2023-11-08 NOTE — Patient Outreach (Addendum)
Care Coordination   Follow Up Visit Note   11/08/2023 Name: Susan Hicks MRN: [id number] DOB: 05/06/1946  Susan Hicks is a 78 y.o. year old female who sees Vyas, Susan Pih, MD for primary care. I spoke with  Susan Hicks by phone today after receiving a VM message from her regarding "swishing in head" sensation and elevated blood pressure.   What matters to the patients health and wellness today?  Managing blood pressure and head discomfort    Goals Addressed             This Visit's Progress    Manage Hypertension   On track    Care Coordination Goals: Patient will take medications as directed and report any negative side effects to provider  Missed one dose of Ativan on 11/04/23 and felt a "swishing sensation" in her head and her blood pressure was elevated. Considered to be a withdrawal symptom by provider.  Restarted Ativan. Advised by Nurse Practitioner at PCP office to take 2 pills per day for 3 days to "get it back in her system" Patient will monitor and record blood pressure daily and as needed and will call PCP or specialist with any readings outside of recommended range Blood pressure readings are transmitted directly to PCP office for review Patient will keep Appt. With PCP on 11/13/23 Patient will keep appointment with cardiologist on 12/11/23 Patient will take blood pressure log to cardiology appointment for review Patient will seek medical attention for any new or worsening symptoms Patient will reach out to RN Care Manager [phone] with any care coordination or resource needs          SDOH assessments and interventions completed:  No     Care Coordination Interventions:  Yes, provided  Interventions Today    Flowsheet Row Most Recent Value  Chronic Disease   Chronic disease during today's visit Hypertension (HTN), Other  [Headache]  General Interventions   General Interventions Discussed/Reviewed General Interventions Discussed,  General Interventions Reviewed, Durable Medical Equipment (DME), Doctor Visits, Labs, Communication with  [Feels a little better today but did have the "swishing in head" sensation this morning and blood pressure is still elevated.]  Labs Kidney Function  [liver functions]  Doctor Visits Discussed/Reviewed Doctor Visits Discussed, Doctor Visits Reviewed, PCP, Specialist  [Saw PCP yesterday for recurrent "Swishing sensation" in head and elevated blood pressure]  Durable Medical Equipment (DME) BP Cuff  [blood pressure was 165/89 today and transmitted to PCP office for review]  PCP/Specialist Visits Compliance with follow-up visit  [PCP on 11/13/23 and cardiologist on 12/11/23 for hospital F/U]  Communication with PCP/Specialists  [faxed lab results from ED visit on 11/05/23 for PCP to review elevated liver functions]  Education Interventions   Education Provided Provided Education  Provided Verbal Education On Exercise, Medication, When to see the doctor, Labs  [blood pressure monitoring. Talk with PCP about blood pressure management. Seek medical attention for any new, worsening, or persistent symptoms.]  Labs Reviewed Kidney Function  [11/05/23 AST 275, ALT 370, Alkaline Phosphatase 122]  Pharmacy Interventions   Pharmacy Dicussed/Reviewed Pharmacy Topics Discussed, Pharmacy Topics Reviewed, Medications and their functions  [Missed dose of Ativan on 11/04/23. Experienced elevated BP and "swishing in head" the next day. Felt to be a withdrawal symptom. NP at PCP office advised to take 2 pills for the next 3 days to "get it back into my system".]      Follow up plan: Follow up call scheduled for 11/13/23  Encounter Outcome:  Patient Visit Completed   Demetrios Loll, RN, BSN [place]  [facility], [facility] RN Care Manager Direct Dial: [phone]

## 2023-11-13 ENCOUNTER — Encounter: Payer: Self-pay | Admitting: *Deleted

## 2023-11-13 DIAGNOSIS — I10 Essential (primary) hypertension: Secondary | ICD-10-CM | POA: Diagnosis not present

## 2023-11-13 DIAGNOSIS — G319 Degenerative disease of nervous system, unspecified: Secondary | ICD-10-CM | POA: Diagnosis not present

## 2023-11-13 DIAGNOSIS — Z299 Encounter for prophylactic measures, unspecified: Secondary | ICD-10-CM | POA: Diagnosis not present

## 2023-11-13 DIAGNOSIS — M351 Other overlap syndromes: Secondary | ICD-10-CM | POA: Diagnosis not present

## 2023-11-13 DIAGNOSIS — G4700 Insomnia, unspecified: Secondary | ICD-10-CM | POA: Diagnosis not present

## 2023-11-14 DIAGNOSIS — I10 Essential (primary) hypertension: Secondary | ICD-10-CM | POA: Diagnosis not present

## 2023-11-19 ENCOUNTER — Encounter: Payer: Self-pay | Admitting: *Deleted

## 2023-11-19 ENCOUNTER — Ambulatory Visit: Payer: Self-pay | Admitting: *Deleted

## 2023-11-19 NOTE — Patient Outreach (Signed)
Care Coordination   Follow Up Visit Note   11/19/2023 Name: Susan Hicks MRN: [id number] DOB: 1946-09-20  Susan Hicks is a 78 y.o. year old female who sees Vyas, Angelina Pih, MD for primary care. I spoke with  Susan Hicks by phone today.  What matters to the patients health and wellness today?  Patient did not have any specific concerns today.    Goals Addressed             This Visit's Progress    Follow-up on Elevated Liver Enzymes       Care Management Goals: Patient will keep appointment with PCP this month Patient will discuss elevated liver enzymes with PCP  Review medications and other potential causes Patient will avoid Tylenol Patient will reach out to RN Care Manager at [phone] with any resource or care management needs      Manage Hypertension   On track    Care Coordination Goals: Patient will take medications as directed and report any negative side effects to provider  Patient will monitor and record blood pressure daily and as needed and will call PCP or specialist with any readings outside of recommended range Blood pressure readings are transmitted directly to PCP office for review Patient will keep all medical appointments PCP later this month cardiologist on 12/11/23 Patient will take blood pressure log to cardiology appointment for review Patient will seek medical attention for any new or worsening symptoms Patient will reach out to RN Care Manager [phone] with any care coordination or resource needs          SDOH assessments and interventions completed:  Yes  SDOH Interventions Today    Flowsheet Row Most Recent Value  SDOH Interventions   Housing Interventions Intervention Not Indicated  Transportation Interventions Intervention Not Indicated  Health Literacy Interventions Intervention Not Indicated        Care Coordination Interventions:  Yes, provided  Interventions Today    Flowsheet Row Most Recent Value   Chronic Disease   Chronic disease during today's visit Hypertension (HTN), Other  [Elevated liver enzymes]  General Interventions   General Interventions Discussed/Reviewed General Interventions Discussed, General Interventions Reviewed, Labs, Lipid Profile, Doctor Visits, Communication with  [Patient reports that blood pressure is better controlled and that the headache and "swishing in head" has resolved.]  Labs --  [needs to repeat liver enzymes. May need lipid panel.]  Doctor Visits Discussed/Reviewed Doctor Visits Discussed, Doctor Visits Reviewed, PCP, Specialist  Durable Medical Equipment (DME) BP Cuff  PCP/Specialist Visits Compliance with follow-up visit  [cardiologist on 12/11/23 for hospital follow-up. Per patient, she has a F/U with PCP at the end of this month.]  Communication with PCP/Specialists  [previously faxed lab results to PCP for review of elevated liver enzymes]  Exercise Interventions   Exercise Discussed/Reviewed Physical Activity  [Able to perform ADLs indepedently]  Physical Activity Discussed/Reviewed Physical Activity Discussed, Physical Activity Reviewed  Education Interventions   Education Provided Provided Education  Provided Verbal Education On Nutrition, Labs, Medication, When to see the doctor, Other  [blood pressure monitoring. Need to discuss elevated liver enzymes with PCP.]  Labs Reviewed --  [No lipid panel available to review. 11/05/23 AST 275, ALT 370, Alkaline Phosphatase 122. Compared to 08/30/23 Alkaline phosphatase 69, ALT 14, AST 26, total bilirubin 0.30]  Nutrition Interventions   Nutrition Discussed/Reviewed Nutrition Discussed, Nutrition Reviewed, Portion sizes, Decreasing fats, Adding fruits and vegetables, Fluid intake, Decreasing sugar intake  Pharmacy Interventions   Pharmacy Dicussed/Reviewed  Pharmacy Topics Discussed, Pharmacy Topics Reviewed, Medications and their functions  [Takes 1 extra strength Tylenol at bedtime. Recommended to avoid  Tylenol for now. Stopped taking leflunomide in 07/2023. That can affect liver, but levels were normal in 08/2023.]  Safety Interventions   Safety Discussed/Reviewed Safety Discussed, Safety Reviewed       Follow up plan: Follow up call scheduled for 12/17/23    Encounter Outcome:  Patient Visit Completed   Demetrios Loll, RN, BSN [place]  [facility], [facility] RN Care Manager Direct Dial: [phone]

## 2023-11-22 DIAGNOSIS — M6530 Trigger finger, unspecified finger: Secondary | ICD-10-CM | POA: Diagnosis not present

## 2023-11-22 DIAGNOSIS — M797 Fibromyalgia: Secondary | ICD-10-CM | POA: Diagnosis not present

## 2023-11-22 DIAGNOSIS — M351 Other overlap syndromes: Secondary | ICD-10-CM | POA: Diagnosis not present

## 2023-11-22 DIAGNOSIS — R682 Dry mouth, unspecified: Secondary | ICD-10-CM | POA: Diagnosis not present

## 2023-11-22 DIAGNOSIS — I7300 Raynaud's syndrome without gangrene: Secondary | ICD-10-CM | POA: Diagnosis not present

## 2023-12-11 ENCOUNTER — Ambulatory Visit: Payer: Medicare Other | Admitting: Nurse Practitioner

## 2023-12-11 NOTE — Progress Notes (Deleted)
 Cardiology Office Note:  .   Date:  12/11/2023  ID:  Susan Hicks, DOB 1946/03/08, MRN [id number] PCP: Ignatius Specking, MD  [facility] Providers Cardiologist:  Dina Rich, MD Electrophysiologist:  Maurice Small, MD    History of Present Illness: .   Susan Hicks is a 78 y.o. female with a PMH of symptomatic bradycardia, s/p PPM in 2021, OSA on CPAP, hypertension, hyperlipidemia, and CKD stage III , who presents today for scheduled follow-up.  I last saw patient on Mar 08, 2023.  Blood pressure was not well-controlled despite compliance with medications.  Was compliant with her CPAP for OSA.  Patient endorsed fatigue that she felt was attributed to metoprolol.  Valsartan was increased to 320 mg daily. No RAS on imaging.  Was referred to Pharm.D. advanced hypertension clinic.  Today she presents for follow-up.  She states BP well controlled today after PCP made recent adjustments with her medications. Shows me BP log that shows elevated readings, she wonders if this is d/t anxiety with having to check her BP every day. Denies any chest pain, shortness of breath, palpitations, syncope, presyncope, dizziness, orthopnea, PND, swelling or significant weight changes, acute bleeding, or claudication. Does admit to wheezing at night.   Previous Antihypertensives:  Lisinopril (Cough) Norvasc (Headache)  Studies Reviewed: Marland Kitchen   CCTA 05/2022:  IMPRESSION: 1. Minimal nonobstructive CAD, CADRADS = 1. Trivial ostial left main calcified plaque. At the mid portion, the RCA is not well visualized due to interfering artifact from pacer wires. Appears to taper due to nondominance, but cannot exclude stenosis due to artifact   2. Coronary calcium score of 19. This was 50th percentile for age and sex matched control. Due to artifact from pacer wires, unable to evaluate calcium in the RCA   3. Normal coronary origin with left dominance.   INTERPRETATION:   CAD-RADS 1: Minimal  non-obstructive CAD (0-24%). Consider non-atherosclerotic causes of chest pain. Consider preventive therapy and risk factor modification.   IMPRESSION: Minimal subsegmental atelectasis in lingula.   Otherwise negative exam.   Echo 05/2020:  1. Left ventricular ejection fraction, by estimation, is 55 to 60%. The  left ventricle has normal function. The left ventricle has no regional  wall motion abnormalities. Left ventricular diastolic parameters are  consistent with Grade I diastolic  dysfunction (impaired relaxation). Elevated left ventricular end-diastolic  pressure. The average left ventricular global longitudinal strain is -22.9  %. The global longitudinal strain is normal.   2. Right ventricular systolic function is normal. The right ventricular  size is normal. There is normal pulmonary artery systolic pressure. The  estimated right ventricular systolic pressure is 24.5 mmHg.   3. The mitral valve is abnormal. Trivial mitral valve regurgitation.   4. The aortic valve is tricuspid. Aortic valve regurgitation is not  visualized. Mild aortic valve sclerosis is present, with no evidence of  aortic valve stenosis.   5. The inferior vena cava is normal in size with greater than 50%  respiratory variability, suggesting right atrial pressure of 3 mmHg.  Physical Exam:   VS:  There were no vitals taken for this visit.   Wt Readings from Last 3 Encounters:  04/20/23 147 lb (66.7 kg)  03/08/23 146 lb 9.6 oz (66.5 kg)  11/03/22 145 lb 9.6 oz (66 kg)    GEN: Well nourished, well developed in no acute distress NECK: No JVD; No carotid bruits CARDIAC: S1/S2, RRR, no murmurs, rubs, gallops RESPIRATORY:  Clear to auscultation  without rales, wheezing or rhonchi  ABDOMEN: Soft, non-tender, non-distended EXTREMITIES:  No edema; No deformity   ASSESSMENT AND PLAN: .    HTN BP stable today. Continue current medication regimen. No RAS on imaging. Discussed to monitor BP at home after  medications and sitting for 5-10 minutes every other day. Salty six diet sheet given. Previously declined pharm D Advanced HTN clinic referral. Continue to follow-up with PCP as he is managing this. Will refill Metoprolol succinate per her request.    2. Symptomatic bradycardia, s/p PPM in 2021, SVT HR well controlled. Remote device check 12/2022 revealed 3 SVT episodes, longest duration was less than 2 minutes, normal device function noted. Denies any symptoms. Continue to follow-up with EP.    3. OSA on CPAP Encouraged continued compliance.    4. CKD stage 3 Most recent sCr was 1.51 with eGFR 36. Avoid nephrotoxic agents. Continue to follow with PCP.  5. Wheezing Etiology most likely d/t pet dander/allergies, as she has recently taken in 2 cats. Discussed care precautions. Continue to follow with PCP.   Dispo: Did discuss 6 month follow-up, however pt is requesting 1 year follow-up. Follow-up with Dr. Dina Rich or APP in 1 year or sooner if anything changes.   Signed, Sharlene Dory, NP

## 2023-12-14 DIAGNOSIS — I10 Essential (primary) hypertension: Secondary | ICD-10-CM | POA: Diagnosis not present

## 2023-12-17 ENCOUNTER — Ambulatory Visit: Payer: Medicare Other | Attending: Nurse Practitioner | Admitting: Nurse Practitioner

## 2023-12-17 ENCOUNTER — Encounter: Payer: Self-pay | Admitting: Nurse Practitioner

## 2023-12-17 ENCOUNTER — Encounter: Payer: Self-pay | Admitting: *Deleted

## 2023-12-17 VITALS — BP 124/80 | HR 68 | Ht 63.0 in | Wt 150.0 lb

## 2023-12-17 DIAGNOSIS — N183 Chronic kidney disease, stage 3 unspecified: Secondary | ICD-10-CM | POA: Diagnosis not present

## 2023-12-17 DIAGNOSIS — G4733 Obstructive sleep apnea (adult) (pediatric): Secondary | ICD-10-CM | POA: Diagnosis not present

## 2023-12-17 DIAGNOSIS — Z950 Presence of cardiac pacemaker: Secondary | ICD-10-CM

## 2023-12-17 DIAGNOSIS — I10 Essential (primary) hypertension: Secondary | ICD-10-CM | POA: Diagnosis not present

## 2023-12-17 DIAGNOSIS — R748 Abnormal levels of other serum enzymes: Secondary | ICD-10-CM | POA: Diagnosis not present

## 2023-12-17 NOTE — Progress Notes (Addendum)
 Cardiology Office Note:  .   Date:  12/17/2023  ID:  Susan Hicks, DOB Feb 11, 1946, MRN [id number] PCP: Ignatius Specking, MD  [facility] Providers Cardiologist:  Dina Rich, MD Electrophysiologist:  Maurice Small, MD    History of Present Illness: .   Susan Hicks is a 78 y.o. female with a PMH of symptomatic bradycardia, s/p PPM in 2021, OSA on CPAP, hypertension, hyperlipidemia, and CKD stage III , who presents today for scheduled follow-up.  I last saw patient on Mar 08, 2023.  Blood pressure was not well-controlled despite compliance with medications.  Was compliant with her CPAP for OSA.  Patient endorsed fatigue that she felt was attributed to metoprolol.  Valsartan was increased to 320 mg daily. No RAS on imaging.  Was referred to Pharm.D. advanced hypertension clinic.  Today she presents for follow-up.  She states BP well controlled today after PCP made recent adjustments with her medications. Shows me BP log that shows elevated readings, she wonders if this is d/t anxiety with having to check her BP every day. Denies any chest pain, shortness of breath, palpitations, syncope, presyncope, dizziness, orthopnea, PND, swelling or significant weight changes, acute bleeding, or claudication. Does admit to wheezing at night.   ED visit on 11/05/2023 for HTN, home reading of 220/100. Admitted to HA and feeling "swimmy headed." Took only 25 mg of her nightly Metoprolol instead of 50 mg at night. Pt was concerned she was having a stroke. Workup was overall unremarkable. Told to f/u with OP Cardiology.   Today she presents for follow-up. Doing well. Says she sends in her BP readings to Dr. Sherril Croon regularly. BP readings vary at times. She is planning to see a GI specialist tomorrow. Denies any chest pain, shortness of breath, palpitations, syncope, presyncope, dizziness, orthopnea, PND, swelling or significant weight changes, acute bleeding, or claudication.   Previous  Antihypertensives:  Lisinopril (Cough) Norvasc (Headache)  Studies Reviewed: Marland Kitchen    EKG:  EKG Interpretation Date/Time:  Monday December 17 2023 16:09:53 EST Ventricular Rate:  60 PR Interval:  222 QRS Duration:  84 QT Interval:  418 QTC Calculation: 418 R Axis:   -27  Text Interpretation: AV dual-paced rhythm with prolonged AV conduction When compared with ECG of 18-Jul-2020 18:30, Vent. rate has decreased BY  13 BPM Confirmed by Sharlene Dory [id number]) on 12/17/2023 4:11:26 PM   CCTA 05/2022:  IMPRESSION: 1. Minimal nonobstructive CAD, CADRADS = 1. Trivial ostial left main calcified plaque. At the mid portion, the RCA is not well visualized due to interfering artifact from pacer wires. Appears to taper due to nondominance, but cannot exclude stenosis due to artifact   2. Coronary calcium score of 19. This was 50th percentile for age and sex matched control. Due to artifact from pacer wires, unable to evaluate calcium in the RCA   3. Normal coronary origin with left dominance.   INTERPRETATION:   CAD-RADS 1: Minimal non-obstructive CAD (0-24%). Consider non-atherosclerotic causes of chest pain. Consider preventive therapy and risk factor modification.   IMPRESSION: Minimal subsegmental atelectasis in lingula.   Otherwise negative exam.   Echo 05/2020:  1. Left ventricular ejection fraction, by estimation, is 55 to 60%. The  left ventricle has normal function. The left ventricle has no regional  wall motion abnormalities. Left ventricular diastolic parameters are  consistent with Grade I diastolic  dysfunction (impaired relaxation). Elevated left ventricular end-diastolic  pressure. The average left ventricular global longitudinal strain is -22.9  %.  The global longitudinal strain is normal.   2. Right ventricular systolic function is normal. The right ventricular  size is normal. There is normal pulmonary artery systolic pressure. The  estimated right ventricular systolic  pressure is 24.5 mmHg.   3. The mitral valve is abnormal. Trivial mitral valve regurgitation.   4. The aortic valve is tricuspid. Aortic valve regurgitation is not  visualized. Mild aortic valve sclerosis is present, with no evidence of  aortic valve stenosis.   5. The inferior vena cava is normal in size with greater than 50%  respiratory variability, suggesting right atrial pressure of 3 mmHg.  Physical Exam:   VS:  BP 124/80   Pulse 68   Ht 5\' 3"  (1.6 m)   Wt 150 lb (68 kg)   SpO2 96%   BMI 26.57 kg/m    Wt Readings from Last 3 Encounters:  12/17/23 150 lb (68 kg)  04/20/23 147 lb (66.7 kg)  03/08/23 146 lb 9.6 oz (66.5 kg)    GEN: Well nourished, well developed in no acute distress NECK: No JVD; No carotid bruits CARDIAC: S1/S2, RRR, no murmurs, rubs, gallops RESPIRATORY:  Clear to auscultation without rales, wheezing or rhonchi  ABDOMEN: Soft, non-tender, non-distended EXTREMITIES:  No edema; No deformity   ASSESSMENT AND PLAN: .    HTN BP stable today. Continue current medication regimen. No RAS on past imaging. Discussed to monitor BP at home after medications and sitting for 5-10 minutes every other day. Salty six diet sheet and BP log given.  Continue to follow-up with PCP as he is managing this.    2. Symptomatic bradycardia, s/p PPM in 2021, SVT HR well controlled. Recent remote device check showed normal device function noted. Denies any symptoms. Continue to follow-up with EP.    3. OSA on CPAP Encouraged continued compliance.    4. CKD stage 3 Most recent sCr was 1.46 with eGFR 37. Avoid nephrotoxic agents. Continue to follow with PCP.  5. Elevated liver enzymes Etiology unclear. Denies any symptoms. Denies any excessive Tylenol use or ETOH. Follow-up with GI as scheduled. PCP and GI to manage. I have routed note to make GI MD aware.    Dispo:  Follow-up with Dr. Dina Rich or APP in 6 months or sooner if anything changes.   Signed, Sharlene Dory,  NP

## 2023-12-17 NOTE — Patient Instructions (Addendum)

## 2023-12-18 ENCOUNTER — Encounter (INDEPENDENT_AMBULATORY_CARE_PROVIDER_SITE_OTHER): Payer: Self-pay | Admitting: Gastroenterology

## 2023-12-18 ENCOUNTER — Ambulatory Visit (INDEPENDENT_AMBULATORY_CARE_PROVIDER_SITE_OTHER): Payer: Medicare Other | Admitting: Gastroenterology

## 2023-12-18 VITALS — BP 177/80 | HR 60 | Temp 97.4°F | Ht 63.0 in | Wt 148.0 lb

## 2023-12-18 DIAGNOSIS — R748 Abnormal levels of other serum enzymes: Secondary | ICD-10-CM | POA: Insufficient documentation

## 2023-12-18 DIAGNOSIS — R7989 Other specified abnormal findings of blood chemistry: Secondary | ICD-10-CM

## 2023-12-18 DIAGNOSIS — K529 Noninfective gastroenteritis and colitis, unspecified: Secondary | ICD-10-CM | POA: Insufficient documentation

## 2023-12-18 DIAGNOSIS — Z8379 Family history of other diseases of the digestive system: Secondary | ICD-10-CM | POA: Insufficient documentation

## 2023-12-18 MED ORDER — METAMUCIL FIBER 2 G PO CHEW: 2.0000 g | CHEWABLE_TABLET | Freq: Every day | ORAL | 1 refills | Status: AC

## 2023-12-18 NOTE — Patient Instructions (Addendum)
 It was very nice to meet you today, as dicussed with will plan for the following :  1) Labwork, ultrasound , xray  2) Stool studies   3) EGD and Colonoscopy

## 2023-12-18 NOTE — Progress Notes (Signed)
 Vista Lawman , M.D. Gastroenterology & Hepatology [facility]astroenter[facility] [address] [place], [place] [address] Primary Care Physician: Ignatius Specking, MD [address] [place] [address]  Chief Complaint: Chronic diarrhea and elevated liver enzymes  History of Present Illness: Susan Hicks is a 78 y.o. female with symptomatic bradycardia, s/p PPM in 2021, OSA on CPAP, hypertension, hyperlipidemia, and CKD stage III , IBS, mixed connective tissue disorder who presents for evaluation of chronic diarrhea in setting of family history of celiac disease and elevated liver enzymes.  Patient reports chronic diarrhea since age 55.  She reports that she has 3-4 liquid bowel movements while taking Imodium daily.  Patient reports family history of celiac disease and granddaughter and other family members who appears to be on gluten-free diet although without any formal evaluation.  Patient herself is on gluten-free diet and would notice if she eats gluten her symptoms of gotten worse.  Denies any hard stools or straining.  Regarding elevated liver enzymes patient does not take any herbal medications or use alcohol.  The only new medication she was started on was leflunomide which was started 2 years back rather  The patient denies having any nausea, vomiting, fever, chills, hematochezia, melena, hematemesis, jaundice, pruritus or weight loss.  Last ONG:EXBM Last Colonoscopy:many years back   FHx: Granddaughter with celiac disease, many other family members on gluten-free diet but never had formal evaluation for celiac disease Social: neg smoking, alcohol or illicit drug use Surgical: no abdominal surgeries  Last labs from 11/2023 AST 277 ALT 323 alk phos 121 Liver enzymes were normal in 2024 Past Medical History: Past Medical History:  Diagnosis Date   Depression    Fibromyalgia    now considered mixed connective tissue disorder   Generalized  anxiety disorder 08/21/2018   GERD (gastroesophageal reflux disease)    Hypertension    Irritable bowel    Junctional bradycardia    a. 2017 - beta blocker discontinued.   LBBB (left bundle branch block)    Mixed connective tissue disease (HCC)    Obstructive sleep apnea treated with continuous positive airway pressure (CPAP) 08/21/2018   Osteoporosis    Premature atrial contractions    Primary localized osteoarthritis of right knee     Past Surgical History: Past Surgical History:  Procedure Laterality Date   BACK SURGERY     BREAST CYST EXCISION     CHOLECYSTECTOMY     HAND SURGERY  2006   PACEMAKER IMPLANT N/A 06/24/2020   Procedure: PACEMAKER IMPLANT;  Surgeon: Hillis Range, MD;  [facility];  Service: Cardiovascular;  Laterality: N/A;   TOTAL KNEE ARTHROPLASTY Right 09/02/2018   Procedure: TOTAL KNEE ARTHROPLASTY;  Surgeon: Salvatore Marvel, MD;  [facility];  Service: Orthopedics;  Laterality: Right;    Family History: Family History  Problem Relation Age of Onset   Thyroid disease Mother    Hypertension Mother    Heart attack Father    Pulmonary disease Father    Hypertension Father    Thyroid disease Sister    Hypertension Sister    Hypertension Daughter    Hypertension Son    Breast cancer Neg Hx     Social History: Social History   Tobacco Use  Smoking Status Never  Smokeless Tobacco Never   Social History   Substance and Sexual Activity  Alcohol Use No   Alcohol/week: 0.0 standard drinks of alcohol   Social History   Substance  and Sexual Activity  Drug Use No    Allergies: Allergies  Allergen Reactions   Lisinopril Cough   Beta Adrenergic Blockers Other (See Comments)    Bradycardia - bottomed out heart rate    Gluten Meal Diarrhea    cramps   Penicillins Hives   Norvasc [Amlodipine] Other (See Comments)    HEADACHE     Medications: Current Outpatient Medications  Medication Sig Dispense Refill   albuterol  (VENTOLIN HFA) 108 (90 Base) MCG/ACT inhaler Inhale 1-2 puffs into the lungs as needed for wheezing or shortness of breath.     Artificial Tear Solution (SOOTHE XP OP) Place 1 drop into both eyes in the morning and at bedtime.     b complex vitamins tablet Take 1 tablet by mouth daily.     busPIRone (BUSPAR) 15 MG tablet Take 15 mg by mouth 2 (two) times daily.     Candesartan Cilexetil-HCTZ 32-25 MG TABS Take 1 tablet by mouth daily.     cetirizine (ZYRTEC) 10 MG tablet Take 10 mg by mouth daily.     chlorpheniramine (CHLOR-TRIMETON) 4 MG tablet Take 4 mg by mouth at bedtime as needed for allergies.     Cholecalciferol (VITAMIN D3) 2000 units TABS Take 2,000 Units by mouth daily.      cycloSPORINE (RESTASIS) 0.05 % ophthalmic emulsion 1 drop 2 (two) times daily.     diphenhydramine-acetaminophen (TYLENOL PM) 25-500 MG TABS tablet Take 1 tablet by mouth at bedtime.      escitalopram (LEXAPRO) 20 MG tablet Take 20 mg by mouth daily.     famotidine (PEPCID) 20 MG tablet Take 20 mg by mouth at bedtime.     hydrALAZINE (APRESOLINE) 25 MG tablet Take 25 mg by mouth 3 (three) times daily.     LORazepam (ATIVAN) 0.5 MG tablet Take 0.5 mg by mouth at bedtime.     Magnesium 400 MG CAPS Take 400 mg by mouth at bedtime.     melatonin 5 MG TABS Take 5 mg by mouth at bedtime.     meloxicam (MOBIC) 7.5 MG tablet Take 7.5 mg by mouth in the morning and at bedtime.     metoprolol succinate (TOPROL-XL) 50 MG 24 hr tablet Take 1 tablet (50 mg total) by mouth daily. Take with or immediately following a meal. 90 tablet 3   Misc Natural Products (BEET ROOT) 500 MG CAPS Take 1 capsule by mouth daily.     Multiple Vitamins-Minerals (ADULT GUMMY) CHEW Chew 2 tablets by mouth daily.     Multiple Vitamins-Minerals (PRESERVISION AREDS PO) Take 1 tablet by mouth daily.     oxybutynin (DITROPAN) 5 MG tablet Take 5 mg by mouth daily.     pantoprazole (PROTONIX) 40 MG tablet Take 40 mg by mouth daily.     PRESCRIPTION  MEDICATION Inhale into the lungs at bedtime. CPAP     Psyllium (METAMUCIL FIBER PO) Take 4 capsules by mouth daily.     rosuvastatin (CRESTOR) 5 MG tablet Take 5 mg by mouth once a week.     traMADol (ULTRAM) 50 MG tablet Take 50 mg by mouth every 6 (six) hours as needed for moderate pain (hip / back pain).     Turmeric Curcumin 500 MG CAPS Take 500 mg by mouth at bedtime.     No current facility-administered medications for this visit.    Review of Systems: GENERAL: negative for malaise, night sweats HEENT: No changes in hearing or vision, no nose bleeds or other  nasal problems. NECK: Negative for lumps, goiter, pain and significant neck swelling RESPIRATORY: Negative for cough, wheezing CARDIOVASCULAR: Negative for chest pain, leg swelling, palpitations, orthopnea GI: SEE HPI MUSCULOSKELETAL: Negative for joint pain or swelling, back pain, and muscle pain. SKIN: Negative for lesions, rash HEMATOLOGY Negative for prolonged bleeding, bruising easily, and swollen nodes. ENDOCRINE: Negative for cold or heat intolerance, polyuria, polydipsia and goiter. NEURO: negative for tremor, gait imbalance, syncope and seizures. The remainder of the review of systems is noncontributory.   Physical Exam: BP (!) 177/80   Pulse 60   Temp (!) 97.4 F (36.3 C)   Ht 5\' 3"  (1.6 m)   Wt 148 lb (67.1 kg)   BMI 26.22 kg/m  GENERAL: The patient is AO x3, in no acute distress. HEENT: Head is normocephalic and atraumatic. EOMI are intact. Mouth is well hydrated and without lesions. NECK: Supple. No masses LUNGS: Clear to auscultation. No presence of rhonchi/wheezing/rales. Adequate chest expansion HEART: RRR, normal s1 and s2. ABDOMEN: Soft, nontender, no guarding, no peritoneal signs, and nondistended. BS +. No masses.  Imaging/Labs: as above     Latest Ref Rng & Units 07/18/2020    7:18 PM 06/22/2020   11:08 AM 09/04/2018    3:07 AM  CBC  WBC 4.0 - 10.5 K/uL 5.0  3.4  8.2   Hemoglobin 12.0 -  15.0 g/dL 40.9  81.1  9.6   Hematocrit 36.0 - 46.0 % 35.3  33.4  29.9   Platelets 150 - 400 K/uL 261  241  259    No results found for: "IRON", "TIBC", "FERRITIN"  I personally reviewed and interpreted the available labs, imaging and endoscopic files.  Impression and Plan: ELAYA DROEGE is a 78 y.o. female with symptomatic bradycardia, s/p PPM in 2021, OSA on CPAP, hypertension, hyperlipidemia, and CKD stage III , IBS, mixed connective tissue disorder who presents for evaluation of chronic diarrhea in setting of family history of celiac disease and elevated liver enzymes.  #Chronic diarrhea  Patient has more than 3 loose stools for more than 4 weeks hence qualifies for chronic diarrhea.  Has diarrhea for at least past 15 years which affects patient's activities of daily living  Patient does report worsening symptoms with gluten and has been on gluten-free diet for a while  Does report family history of celiac disease and this might as well be gluten sensitivity/celiac disease.  There is also could be microscopic colitis given symptoms started at a later age .  Patient diet is low in fiber and recommended Metamucil starting 1 scoop daily for 1 week followed by 2 scoops daily second week, and 3 scoops daily thereafter, to bulk up stool  We will obtain blood work including CBC BMP CRP liver function thyroid function fecal calprotectin, check for celiac disease with TTG IgA and total IgA, and obtain stool studies.    Ideally labs to be done at least 6 to 8 weeks after gluten challenge protocol at least 3 to 10 g of gluten daily.  Although patient is very hesitant on going back on gluten and I advised at least 2 weeks of gluten diet before testing  Abdominal x-ray to evaluate stool burden to ensure were not dealing with overflow diarrhea  Given severity of symptoms and to establish diagnosis of celiac disease proceed with EGD with small bowel biopsies and Colonoscopy with random colonic  biopsies to rule out microscopic colitis after gluten challenge  #Elevated liver enzymes Hepatocellular pattern liver injury  Last  labs from 11/2023 AST 277 ALT 323 alk phos 121  This could be DILI (drug-induced liver injury) given polypharmacy.  Patient was on leflunomide which is Likelihood score: B (well known cause of idiosyncratic clinically apparent liver injury as well as reactivation of hepatitis B). Although this was started 2 years ago and her liver enzymes were normal in 2024  Patient does have autoimmune condition with mixed connective disorder and need to rule out autoimmune hepatitis as well  Will repeat liver enzymes with complete abdominal ultrasound, viral hepatitis serologies and autoimmune serologies  ED precautions given if any jaundice worsening pruritus or confusion come to the ER  All questions were answered.      Vista Lawman, MD Gastroenterology and Hepatology [facility]   This chart has been completed using [facility] Dictation software, and while attempts have been made to ensure accuracy , certain words and phrases may not be transcribed as intended

## 2023-12-18 NOTE — H&P (View-Only) (Signed)
 Susan Hicks , M.D. Gastroenterology & Hepatology [facility] Gastroenterology [address] [place], [place] [address] Primary Care Physician: Ignatius Specking, MD [address] [place] [address]  Chief Complaint: Chronic diarrhea and elevated liver enzymes  History of Present Illness: Susan Hicks is a 78 y.o. female with symptomatic bradycardia, s/p PPM in 2021, OSA on CPAP, hypertension, hyperlipidemia, and CKD stage III , IBS, mixed connective tissue disorder who presents for evaluation of chronic diarrhea in setting of family history of celiac disease and elevated liver enzymes.  Patient reports chronic diarrhea since age 55.  She reports that she has 3-4 liquid bowel movements while taking Imodium daily.  Patient reports family history of celiac disease and granddaughter and other family members who appears to be on gluten-free diet although without any formal evaluation.  Patient herself is on gluten-free diet and would notice if she eats gluten her symptoms of gotten worse.  Denies any hard stools or straining.  Regarding elevated liver enzymes patient does not take any herbal medications or use alcohol.  The only new medication she was started on was leflunomide which was started 2 years back rather  The patient denies having any nausea, vomiting, fever, chills, hematochezia, melena, hematemesis, jaundice, pruritus or weight loss.  Last ONG:EXBM Last Colonoscopy:many years back   FHx: Granddaughter with celiac disease, many other family members on gluten-free diet but never had formal evaluation for celiac disease Social: neg smoking, alcohol or illicit drug use Surgical: no abdominal surgeries  Last labs from 11/2023 AST 277 ALT 323 alk phos 121 Liver enzymes were normal in 2024 Past Medical History: Past Medical History:  Diagnosis Date   Depression    Fibromyalgia    now considered mixed connective tissue disorder   Generalized  anxiety disorder 08/21/2018   GERD (gastroesophageal reflux disease)    Hypertension    Irritable bowel    Junctional bradycardia    a. 2017 - beta blocker discontinued.   LBBB (left bundle branch block)    Mixed connective tissue disease (HCC)    Obstructive sleep apnea treated with continuous positive airway pressure (CPAP) 08/21/2018   Osteoporosis    Premature atrial contractions    Primary localized osteoarthritis of right knee     Past Surgical History: Past Surgical History:  Procedure Laterality Date   BACK SURGERY     BREAST CYST EXCISION     CHOLECYSTECTOMY     HAND SURGERY  2006   PACEMAKER IMPLANT N/A 06/24/2020   Procedure: PACEMAKER IMPLANT;  Surgeon: Hillis Range, MD;  Location: [facility];  Service: Cardiovascular;  Laterality: N/A;   TOTAL KNEE ARTHROPLASTY Right 09/02/2018   Procedure: TOTAL KNEE ARTHROPLASTY;  Surgeon: Salvatore Marvel, MD;  [facility];  Service: Orthopedics;  Laterality: Right;    Family History: Family History  Problem Relation Age of Onset   Thyroid disease Mother    Hypertension Mother    Heart attack Father    Pulmonary disease Father    Hypertension Father    Thyroid disease Sister    Hypertension Sister    Hypertension Daughter    Hypertension Son    Breast cancer Neg Hx     Social History: Social History   Tobacco Use  Smoking Status Never  Smokeless Tobacco Never   Social History   Substance and Sexual Activity  Alcohol Use No   Alcohol/week: 0.0 standard drinks of alcohol   Social History   Substance  and Sexual Activity  Drug Use No    Allergies: Allergies  Allergen Reactions   Lisinopril Cough   Beta Adrenergic Blockers Other (See Comments)    Bradycardia - bottomed out heart rate    Gluten Meal Diarrhea    cramps   Penicillins Hives   Norvasc [Amlodipine] Other (See Comments)    HEADACHE     Medications: Current Outpatient Medications  Medication Sig Dispense Refill   albuterol  (VENTOLIN HFA) 108 (90 Base) MCG/ACT inhaler Inhale 1-2 puffs into the lungs as needed for wheezing or shortness of breath.     Artificial Tear Solution (SOOTHE XP OP) Place 1 drop into both eyes in the morning and at bedtime.     b complex vitamins tablet Take 1 tablet by mouth daily.     busPIRone (BUSPAR) 15 MG tablet Take 15 mg by mouth 2 (two) times daily.     Candesartan Cilexetil-HCTZ 32-25 MG TABS Take 1 tablet by mouth daily.     cetirizine (ZYRTEC) 10 MG tablet Take 10 mg by mouth daily.     chlorpheniramine (CHLOR-TRIMETON) 4 MG tablet Take 4 mg by mouth at bedtime as needed for allergies.     Cholecalciferol (VITAMIN D3) 2000 units TABS Take 2,000 Units by mouth daily.      cycloSPORINE (RESTASIS) 0.05 % ophthalmic emulsion 1 drop 2 (two) times daily.     diphenhydramine-acetaminophen (TYLENOL PM) 25-500 MG TABS tablet Take 1 tablet by mouth at bedtime.      escitalopram (LEXAPRO) 20 MG tablet Take 20 mg by mouth daily.     famotidine (PEPCID) 20 MG tablet Take 20 mg by mouth at bedtime.     hydrALAZINE (APRESOLINE) 25 MG tablet Take 25 mg by mouth 3 (three) times daily.     LORazepam (ATIVAN) 0.5 MG tablet Take 0.5 mg by mouth at bedtime.     Magnesium 400 MG CAPS Take 400 mg by mouth at bedtime.     melatonin 5 MG TABS Take 5 mg by mouth at bedtime.     meloxicam (MOBIC) 7.5 MG tablet Take 7.5 mg by mouth in the morning and at bedtime.     metoprolol succinate (TOPROL-XL) 50 MG 24 hr tablet Take 1 tablet (50 mg total) by mouth daily. Take with or immediately following a meal. 90 tablet 3   Misc Natural Products (BEET ROOT) 500 MG CAPS Take 1 capsule by mouth daily.     Multiple Vitamins-Minerals (ADULT GUMMY) CHEW Chew 2 tablets by mouth daily.     Multiple Vitamins-Minerals (PRESERVISION AREDS PO) Take 1 tablet by mouth daily.     oxybutynin (DITROPAN) 5 MG tablet Take 5 mg by mouth daily.     pantoprazole (PROTONIX) 40 MG tablet Take 40 mg by mouth daily.     PRESCRIPTION  MEDICATION Inhale into the lungs at bedtime. CPAP     Psyllium (METAMUCIL FIBER PO) Take 4 capsules by mouth daily.     rosuvastatin (CRESTOR) 5 MG tablet Take 5 mg by mouth once a week.     traMADol (ULTRAM) 50 MG tablet Take 50 mg by mouth every 6 (six) hours as needed for moderate pain (hip / back pain).     Turmeric Curcumin 500 MG CAPS Take 500 mg by mouth at bedtime.     No current facility-administered medications for this visit.    Review of Systems: GENERAL: negative for malaise, night sweats HEENT: No changes in hearing or vision, no nose bleeds or other  nasal problems. NECK: Negative for lumps, goiter, pain and significant neck swelling RESPIRATORY: Negative for cough, wheezing CARDIOVASCULAR: Negative for chest pain, leg swelling, palpitations, orthopnea GI: SEE HPI MUSCULOSKELETAL: Negative for joint pain or swelling, back pain, and muscle pain. SKIN: Negative for lesions, rash HEMATOLOGY Negative for prolonged bleeding, bruising easily, and swollen nodes. ENDOCRINE: Negative for cold or heat intolerance, polyuria, polydipsia and goiter. NEURO: negative for tremor, gait imbalance, syncope and seizures. The remainder of the review of systems is noncontributory.   Physical Exam: BP (!) 177/80   Pulse 60   Temp (!) 97.4 F (36.3 C)   Ht 5\' 3"  (1.6 m)   Wt 148 lb (67.1 kg)   BMI 26.22 kg/m  GENERAL: The patient is AO x3, in no acute distress. HEENT: Head is normocephalic and atraumatic. EOMI are intact. Mouth is well hydrated and without lesions. NECK: Supple. No masses LUNGS: Clear to auscultation. No presence of rhonchi/wheezing/rales. Adequate chest expansion HEART: RRR, normal s1 and s2. ABDOMEN: Soft, nontender, no guarding, no peritoneal signs, and nondistended. BS +. No masses.  Imaging/Labs: as above     Latest Ref Rng & Units 07/18/2020    7:18 PM 06/22/2020   11:08 AM 09/04/2018    3:07 AM  CBC  WBC 4.0 - 10.5 K/uL 5.0  3.4  8.2   Hemoglobin 12.0 -  15.0 g/dL 40.9  81.1  9.6   Hematocrit 36.0 - 46.0 % 35.3  33.4  29.9   Platelets 150 - 400 K/uL 261  241  259    No results found for: "IRON", "TIBC", "FERRITIN"  I personally reviewed and interpreted the available labs, imaging and endoscopic files.  Impression and Plan: Susan Hicks is a 78 y.o. female with symptomatic bradycardia, s/p PPM in 2021, OSA on CPAP, hypertension, hyperlipidemia, and CKD stage III , IBS, mixed connective tissue disorder who presents for evaluation of chronic diarrhea in setting of family history of celiac disease and elevated liver enzymes.  #Chronic diarrhea  Patient has more than 3 loose stools for more than 4 weeks hence qualifies for chronic diarrhea.  Has diarrhea for at least past 15 years which affects patient's activities of daily living  Patient does report worsening symptoms with gluten and has been on gluten-free diet for a while  Does report family history of celiac disease and this might as well be gluten sensitivity/celiac disease.  There is also could be microscopic colitis given symptoms started at a later age .  Patient diet is low in fiber and recommended Metamucil starting 1 scoop daily for 1 week followed by 2 scoops daily second week, and 3 scoops daily thereafter, to bulk up stool  We will obtain blood work including CBC BMP CRP liver function thyroid function fecal calprotectin, check for celiac disease with TTG IgA and total IgA, and obtain stool studies.    Ideally labs to be done at least 6 to 8 weeks after gluten challenge protocol at least 3 to 10 g of gluten daily.  Although patient is very hesitant on going back on gluten and I advised at least 2 weeks of gluten diet before testing  Abdominal x-ray to evaluate stool burden to ensure were not dealing with overflow diarrhea  Given severity of symptoms and to establish diagnosis of celiac disease proceed with EGD with small bowel biopsies and Colonoscopy with random colonic  biopsies to rule out microscopic colitis after gluten challenge  #Elevated liver enzymes Hepatocellular pattern liver injury  Last  labs from 11/2023 AST 277 ALT 323 alk phos 121  This could be DILI (drug-induced liver injury) given polypharmacy.  Patient was on leflunomide which is Likelihood score: B (well known cause of idiosyncratic clinically apparent liver injury as well as reactivation of hepatitis B). Although this was started 2 years ago and her liver enzymes were normal in 2024  Patient does have autoimmune condition with mixed connective disorder and need to rule out autoimmune hepatitis as well  Will repeat liver enzymes with complete abdominal ultrasound, viral hepatitis serologies and autoimmune serologies  ED precautions given if any jaundice worsening pruritus or confusion come to the ER  All questions were answered.      Susan Lawman, MD Gastroenterology and Hepatology [facility]   This chart has been completed using [facility] Dictation software, and while attempts have been made to ensure accuracy , certain words and phrases may not be transcribed as intended

## 2023-12-20 ENCOUNTER — Ambulatory Visit: Payer: Medicare Other

## 2023-12-20 DIAGNOSIS — I441 Atrioventricular block, second degree: Secondary | ICD-10-CM | POA: Diagnosis not present

## 2023-12-23 LAB — CUP PACEART REMOTE DEVICE CHECK
Battery Remaining Longevity: 107 mo
Battery Voltage: 3 V
Brady Statistic AP VP Percent: 51.58 %
Brady Statistic AP VS Percent: 5.96 %
Brady Statistic AS VP Percent: 2.07 %
Brady Statistic AS VS Percent: 40.4 %
Brady Statistic RA Percent Paced: 57.4 %
Brady Statistic RV Percent Paced: 53.65 %
Date Time Interrogation Session: 20250305234449
Implantable Lead Connection Status: 753985
Implantable Lead Connection Status: 753985
Implantable Lead Implant Date: 20210909
Implantable Lead Implant Date: 20210909
Implantable Lead Location: 753859
Implantable Lead Location: 753860
Implantable Lead Model: 3830
Implantable Lead Model: 5076
Implantable Pulse Generator Implant Date: 20210909
Lead Channel Impedance Value: 323 Ohm
Lead Channel Impedance Value: 342 Ohm
Lead Channel Impedance Value: 380 Ohm
Lead Channel Impedance Value: 456 Ohm
Lead Channel Pacing Threshold Amplitude: 0.625 V
Lead Channel Pacing Threshold Amplitude: 0.625 V
Lead Channel Pacing Threshold Pulse Width: 0.4 ms
Lead Channel Pacing Threshold Pulse Width: 0.4 ms
Lead Channel Sensing Intrinsic Amplitude: 1.125 mV
Lead Channel Sensing Intrinsic Amplitude: 1.125 mV
Lead Channel Sensing Intrinsic Amplitude: 6.625 mV
Lead Channel Sensing Intrinsic Amplitude: 6.625 mV
Lead Channel Setting Pacing Amplitude: 2 V
Lead Channel Setting Pacing Amplitude: 2.5 V
Lead Channel Setting Pacing Pulse Width: 0.4 ms
Lead Channel Setting Sensing Sensitivity: 0.9 mV
Zone Setting Status: 755011

## 2023-12-24 ENCOUNTER — Telehealth (INDEPENDENT_AMBULATORY_CARE_PROVIDER_SITE_OTHER): Payer: Self-pay | Admitting: Gastroenterology

## 2023-12-24 MED ORDER — PEG 3350-KCL-NA BICARB-NACL 420 G PO SOLR: 4000.0000 mL | Freq: Once | ORAL | 0 refills | Status: AC

## 2023-12-24 NOTE — Telephone Encounter (Signed)
 ERROR

## 2023-12-24 NOTE — Telephone Encounter (Signed)
 Thanks for letting me and know scheduling the procedure   I do recommend if BP remains high to follow up with PCP

## 2023-12-24 NOTE — Telephone Encounter (Signed)
 Pt contacted and scheduled TCS/EGD for 01/17/24. Instructions will be mailed. Prep sent to pharmacy. No PA needed per insurance.       FYI-Pt states that her BP has been elevated. Yesterday it ran 192-92 and 187/95. Pt is taking medications as directed. Pt also states that she has a headache for about 3-4 minutes when eating gluten and needing to have BM;after BM headache dose subside. Pt states she is not eating a lot of gluten.

## 2023-12-25 ENCOUNTER — Ambulatory Visit: Payer: Self-pay | Admitting: *Deleted

## 2023-12-25 NOTE — Patient Outreach (Signed)
 Care Coordination   12/25/2023 Name: Susan Hicks MRN: [id number] DOB: 1946-03-19   Care Coordination Outreach Attempts:  An unsuccessful outreach was attempted for an appointment today.  Follow Up Plan:  Additional outreach attempts will be made to offer the patient complex care management information and services.   Encounter Outcome:  No Answer. Left HIPAA compliant VM.   Care Coordination Interventions:  No, not indicated. Staff message sent to care guide requesting outreach and rescheduling.    Demetrios Loll, RN, BSN [place]  [facility], [facility] RN Care Manager Direct Dial: [phone]

## 2023-12-27 ENCOUNTER — Other Ambulatory Visit: Payer: Self-pay | Admitting: Cardiovascular Disease

## 2023-12-27 ENCOUNTER — Encounter: Payer: Self-pay | Admitting: Cardiovascular Disease

## 2023-12-27 DIAGNOSIS — I10 Essential (primary) hypertension: Secondary | ICD-10-CM | POA: Diagnosis not present

## 2023-12-27 DIAGNOSIS — Z7189 Other specified counseling: Secondary | ICD-10-CM | POA: Diagnosis not present

## 2023-12-27 DIAGNOSIS — R7989 Other specified abnormal findings of blood chemistry: Secondary | ICD-10-CM | POA: Diagnosis not present

## 2023-12-27 DIAGNOSIS — Z0000 Encounter for general adult medical examination without abnormal findings: Secondary | ICD-10-CM | POA: Diagnosis not present

## 2023-12-27 DIAGNOSIS — I739 Peripheral vascular disease, unspecified: Secondary | ICD-10-CM | POA: Diagnosis not present

## 2023-12-27 DIAGNOSIS — Z299 Encounter for prophylactic measures, unspecified: Secondary | ICD-10-CM | POA: Diagnosis not present

## 2023-12-27 NOTE — Telephone Encounter (Signed)
This is a Nurse, mental health pt

## 2024-01-08 ENCOUNTER — Encounter: Payer: Self-pay | Admitting: *Deleted

## 2024-01-08 ENCOUNTER — Ambulatory Visit: Payer: Self-pay | Admitting: *Deleted

## 2024-01-08 NOTE — Patient Outreach (Signed)
 Care Coordination   Follow Up Visit Note   01/08/2024 Name: Susan Hicks MRN: [id number] DOB: 04-Jan-1946  Susan Hicks is a 78 y.o. year old female who sees Vyas, Angelina Pih, MD for primary care. I spoke with  Susan Hicks by phone today.  What matters to the patients health and wellness today?  Finding cause of diarrhea    Goals Addressed             This Visit's Progress    Follow-up on Elevated Liver Enzymes   On track    Care Management Goals: Patient will keep all medical appointments Patient will have lab work ordered by gastroenterologist done within the next couple of weeks Patient will reach out to RN Care Manager at [phone] with any resource or care management needs         SDOH assessments and interventions completed:  No     Care Coordination Interventions:  Yes, provided  Interventions Today    Flowsheet Row Most Recent Value  Chronic Disease   Chronic disease during today's visit Other  [Irritable Bowel Syndrome, Chronic Diarrhea, Elevated Liver Enzymes]  General Interventions   General Interventions Discussed/Reviewed General Interventions Discussed, General Interventions Reviewed, Labs  [reported 3-4 diarrhea stools per day at gastroenterology visit. Reports that has improved and it's not as frequent. Denies being acutely sick with N/V/D or abdominal pain in January when her liver enzymes were elevated.]  Labs --  [Has active orders from gastroenterologist to recheck liver enzymes and a celiac panel]  Education Interventions   Education Provided Provided Education  Provided Verbal Education On Nutrition, When to see the doctor, Medication, Labs, Other  [discussed upcoming endoscopy and active labs for celiac panel.  A biopsy of her small intestine can be done during the endoscopy to assess for celiac disease, even if lab tests are normal.]  Labs Reviewed --  [reviewed liver enzyme results from visit with PCP office 2 weeks ago to  inpatient labs in January. Levels were much improved and WNL. Patient reports elevated liver enzymes through rheumatology office prior to hospitalization in January.]  Mental Health Interventions   Mental Health Discussed/Reviewed Mental Health Discussed, Mental Health Reviewed  [patient was anxious about restarting gluten after being gluten free for 5 years and mostly gluten free for the 8 before that. She is tolerating it well.]  Nutrition Interventions   Nutrition Discussed/Reviewed Nutrition Discussed, Nutrition Reviewed  [per gastroenterologist's instructions, patient has added gluten back into her diet after being gluten free for 5 years. She reports no negative side effects. She has to be exposed to gluten at least 2 weeks before lab tests can be done.]  Pharmacy Interventions   Pharmacy Dicussed/Reviewed Pharmacy Topics Discussed, Pharmacy Topics Reviewed  [taking medications as prescribed. Denies any new medications or supplements or increased Tylenol use in January when her liver enzymes were elevated. May want to continue to avoid tylenol for now.]       Follow up plan: Follow up call scheduled for 01/31/24    Encounter Outcome:  Patient Visit Completed   Demetrios Loll, RN, BSN [place]  [facility], [facility] RN Care Manager Direct Dial: [phone]

## 2024-01-13 DIAGNOSIS — I10 Essential (primary) hypertension: Secondary | ICD-10-CM | POA: Diagnosis not present

## 2024-01-14 ENCOUNTER — Encounter (HOSPITAL_COMMUNITY)
Admission: RE | Admit: 2024-01-14 | Discharge: 2024-01-14 | Disposition: A | Discharge status: Home / Self Care | Source: Ambulatory Visit | Attending: Gastroenterology | Admitting: Gastroenterology

## 2024-01-15 ENCOUNTER — Other Ambulatory Visit (HOSPITAL_COMMUNITY)
Admission: RE | Admit: 2024-01-15 | Discharge: 2024-01-15 | Disposition: A | Discharge status: Home / Self Care | Source: Ambulatory Visit | Attending: Gastroenterology | Admitting: Gastroenterology

## 2024-01-15 ENCOUNTER — Ambulatory Visit (HOSPITAL_COMMUNITY)
Admission: RE | Admit: 2024-01-15 | Discharge: 2024-01-15 | Disposition: A | Discharge status: Home / Self Care | Source: Ambulatory Visit | Attending: Gastroenterology | Admitting: Gastroenterology

## 2024-01-15 DIAGNOSIS — R748 Abnormal levels of other serum enzymes: Secondary | ICD-10-CM | POA: Insufficient documentation

## 2024-01-15 DIAGNOSIS — K529 Noninfective gastroenteritis and colitis, unspecified: Secondary | ICD-10-CM | POA: Insufficient documentation

## 2024-01-15 DIAGNOSIS — R760 Raised antibody titer: Secondary | ICD-10-CM | POA: Diagnosis not present

## 2024-01-15 DIAGNOSIS — Z8379 Family history of other diseases of the digestive system: Secondary | ICD-10-CM | POA: Insufficient documentation

## 2024-01-15 LAB — COMPREHENSIVE METABOLIC PANEL WITH GFR
ALT: 41 U/L (ref 0–44)
AST: 51 U/L — ABNORMAL HIGH (ref 15–41)
Albumin: 4.1 g/dL (ref 3.5–5.0)
Alkaline Phosphatase: 54 U/L (ref 38–126)
Anion gap: 13 (ref 5–15)
BUN: 32 mg/dL — ABNORMAL HIGH (ref 8–23)
CO2: 20 mmol/L — ABNORMAL LOW (ref 22–32)
Calcium: 9.3 mg/dL (ref 8.9–10.3)
Chloride: 101 mmol/L (ref 98–111)
Creatinine, Ser: 1.41 mg/dL — ABNORMAL HIGH (ref 0.44–1.00)
GFR, Estimated: 38 mL/min — ABNORMAL LOW (ref >60)
Glucose, Bld: 96 mg/dL (ref 70–99)
Potassium: 4 mmol/L (ref 3.5–5.1)
Sodium: 134 mmol/L — ABNORMAL LOW (ref 135–145)
Total Bilirubin: 0.7 mg/dL (ref 0.0–1.2)
Total Protein: 7.2 g/dL (ref 6.5–8.1)

## 2024-01-15 LAB — CBC
HCT: 36.9 % (ref 36.0–46.0)
Hemoglobin: 12.4 g/dL (ref 12.0–15.0)
MCH: 29 pg (ref 26.0–34.0)
MCHC: 33.6 g/dL (ref 30.0–36.0)
MCV: 86.4 fL (ref 80.0–100.0)
Platelets: 193 10*3/uL (ref 150–400)
RBC: 4.27 MIL/uL (ref 3.87–5.11)
RDW: 12.6 % (ref 11.5–15.5)
WBC: 5.1 10*3/uL (ref 4.0–10.5)
nRBC: 0 % (ref 0.0–0.2)

## 2024-01-15 LAB — TSH: TSH: 1.316 u[IU]/mL (ref 0.350–4.500)

## 2024-01-15 LAB — HEPATITIS C ANTIBODY: HCV Ab: NONREACTIVE

## 2024-01-15 LAB — PROTIME-INR
INR: 1 (ref 0.8–1.2)
Prothrombin Time: 13.9 s (ref 11.4–15.2)

## 2024-01-15 LAB — C-REACTIVE PROTEIN: CRP: 0.7 mg/dL (ref <1.0)

## 2024-01-15 LAB — HEPATITIS B SURFACE ANTIGEN: Hepatitis B Surface Ag: NONREACTIVE

## 2024-01-15 LAB — HEPATITIS B SURFACE ANTIBODY,QUALITATIVE: Hep B S Ab: NONREACTIVE

## 2024-01-15 LAB — IRON AND TIBC
Iron: 123 ug/dL (ref 28–170)
Saturation Ratios: 28 % (ref 10.4–31.8)
TIBC: 433 ug/dL (ref 250–450)
UIBC: 310 ug/dL

## 2024-01-15 LAB — HIV ANTIBODY (ROUTINE TESTING W REFLEX): HIV Screen 4th Generation wRfx: NONREACTIVE

## 2024-01-15 LAB — HEPATITIS A ANTIBODY, TOTAL: hep A Total Ab: NONREACTIVE

## 2024-01-15 LAB — FERRITIN: Ferritin: 29 ng/mL (ref 11–307)

## 2024-01-16 ENCOUNTER — Ambulatory Visit (HOSPITAL_COMMUNITY)
Admission: RE | Admit: 2024-01-16 | Discharge: 2024-01-16 | Disposition: A | Discharge status: Home / Self Care | Source: Ambulatory Visit | Attending: Gastroenterology | Admitting: Gastroenterology

## 2024-01-16 ENCOUNTER — Other Ambulatory Visit (HOSPITAL_COMMUNITY)
Admission: RE | Admit: 2024-01-16 | Discharge: 2024-01-16 | Disposition: A | Discharge status: Home / Self Care | Source: Ambulatory Visit | Attending: Gastroenterology | Admitting: Gastroenterology

## 2024-01-16 DIAGNOSIS — R748 Abnormal levels of other serum enzymes: Secondary | ICD-10-CM | POA: Insufficient documentation

## 2024-01-16 DIAGNOSIS — K529 Noninfective gastroenteritis and colitis, unspecified: Secondary | ICD-10-CM | POA: Insufficient documentation

## 2024-01-16 DIAGNOSIS — N281 Cyst of kidney, acquired: Secondary | ICD-10-CM | POA: Diagnosis not present

## 2024-01-16 LAB — ANA: Anti Nuclear Antibody (ANA): POSITIVE — AB

## 2024-01-16 LAB — MITOCHONDRIAL ANTIBODIES: Mitochondrial M2 Ab, IgG: <20 U (ref 0.0–20.0)

## 2024-01-16 LAB — C DIFFICILE QUICK SCREEN W PCR REFLEX
C Diff antigen: NEGATIVE
C Diff interpretation: NOT DETECTED
C Diff toxin: NEGATIVE

## 2024-01-16 LAB — HEPATITIS B CORE ANTIBODY, TOTAL: HEP B CORE AB: NEGATIVE

## 2024-01-16 LAB — ANTI-SMOOTH MUSCLE ANTIBODY, IGG: F-Actin IgG: 21 U — ABNORMAL HIGH (ref 0–19)

## 2024-01-16 NOTE — Anesthesia Preprocedure Evaluation (Signed)
 Anesthesia Evaluation  Patient identified by MRN, date of birth, ID band Patient awake    Reviewed: Allergy & Precautions, H&P , NPO status , Patient's Chart, lab work & pertinent test results, reviewed documented beta blocker date and time   Airway Mallampati: II  TM Distance: >3 FB Neck ROM: full    Dental no notable dental hx. (+) Dental Advisory Given, Teeth Intact   Pulmonary sleep apnea    Pulmonary exam normal breath sounds clear to auscultation       Cardiovascular hypertension, + DOE  Normal cardiovascular exam+ dysrhythmias  Rhythm:regular Rate:Normal  LBBB. Junctional bradycardia   Neuro/Psych  PSYCHIATRIC DISORDERS Anxiety Depression     Neuromuscular disease    GI/Hepatic Neg liver ROS,GERD  ,,  Endo/Other  negative endocrine ROS    Renal/GU negative Renal ROS  negative genitourinary   Musculoskeletal  (+) Arthritis , Osteoarthritis,  Fibromyalgia -  Abdominal   Peds  Hematology negative hematology ROS (+)   Anesthesia Other Findings   Reproductive/Obstetrics negative OB ROS                             Anesthesia Physical Anesthesia Plan  ASA: 3  Anesthesia Plan: General   Post-op Pain Management: Minimal or no pain anticipated   Induction: Intravenous  PONV Risk Score and Plan: Propofol infusion  Airway Management Planned: Nasal Cannula and Natural Airway  Additional Equipment: None  Intra-op Plan:   Post-operative Plan:   Informed Consent: I have reviewed the patients History and Physical, chart, labs and discussed the procedure including the risks, benefits and alternatives for the proposed anesthesia with the patient or authorized representative who has indicated his/her understanding and acceptance.     Dental Advisory Given  Plan Discussed with: CRNA  Anesthesia Plan Comments:        Anesthesia Quick Evaluation

## 2024-01-17 ENCOUNTER — Encounter (HOSPITAL_COMMUNITY): Payer: Self-pay | Admitting: Gastroenterology

## 2024-01-17 ENCOUNTER — Ambulatory Visit (HOSPITAL_COMMUNITY): Admitting: Anesthesiology

## 2024-01-17 ENCOUNTER — Encounter (HOSPITAL_COMMUNITY)
Admission: RE | Disposition: A | Discharge status: Home / Self Care | Payer: Self-pay | Source: Home / Self Care | Attending: Gastroenterology

## 2024-01-17 ENCOUNTER — Other Ambulatory Visit (INDEPENDENT_AMBULATORY_CARE_PROVIDER_SITE_OTHER): Payer: Self-pay | Admitting: *Deleted

## 2024-01-17 ENCOUNTER — Telehealth (INDEPENDENT_AMBULATORY_CARE_PROVIDER_SITE_OTHER): Payer: Self-pay | Admitting: *Deleted

## 2024-01-17 ENCOUNTER — Ambulatory Visit (HOSPITAL_COMMUNITY)
Admission: RE | Admit: 2024-01-17 | Discharge: 2024-01-17 | Disposition: A | Discharge status: Home / Self Care | Attending: Gastroenterology | Admitting: Gastroenterology

## 2024-01-17 ENCOUNTER — Encounter (INDEPENDENT_AMBULATORY_CARE_PROVIDER_SITE_OTHER): Payer: Self-pay | Admitting: *Deleted

## 2024-01-17 DIAGNOSIS — M797 Fibromyalgia: Secondary | ICD-10-CM | POA: Diagnosis not present

## 2024-01-17 DIAGNOSIS — K900 Celiac disease: Secondary | ICD-10-CM | POA: Diagnosis not present

## 2024-01-17 DIAGNOSIS — R748 Abnormal levels of other serum enzymes: Secondary | ICD-10-CM

## 2024-01-17 DIAGNOSIS — K529 Noninfective gastroenteritis and colitis, unspecified: Secondary | ICD-10-CM

## 2024-01-17 DIAGNOSIS — K219 Gastro-esophageal reflux disease without esophagitis: Secondary | ICD-10-CM | POA: Diagnosis not present

## 2024-01-17 DIAGNOSIS — K648 Other hemorrhoids: Secondary | ICD-10-CM | POA: Diagnosis not present

## 2024-01-17 DIAGNOSIS — K3189 Other diseases of stomach and duodenum: Secondary | ICD-10-CM | POA: Diagnosis not present

## 2024-01-17 DIAGNOSIS — K317 Polyp of stomach and duodenum: Secondary | ICD-10-CM | POA: Diagnosis not present

## 2024-01-17 DIAGNOSIS — R197 Diarrhea, unspecified: Secondary | ICD-10-CM | POA: Diagnosis not present

## 2024-01-17 DIAGNOSIS — K6389 Other specified diseases of intestine: Secondary | ICD-10-CM | POA: Insufficient documentation

## 2024-01-17 DIAGNOSIS — K5730 Diverticulosis of large intestine without perforation or abscess without bleeding: Secondary | ICD-10-CM

## 2024-01-17 DIAGNOSIS — G4733 Obstructive sleep apnea (adult) (pediatric): Secondary | ICD-10-CM | POA: Diagnosis not present

## 2024-01-17 DIAGNOSIS — K2970 Gastritis, unspecified, without bleeding: Secondary | ICD-10-CM

## 2024-01-17 DIAGNOSIS — I10 Essential (primary) hypertension: Secondary | ICD-10-CM

## 2024-01-17 DIAGNOSIS — D131 Benign neoplasm of stomach: Secondary | ICD-10-CM | POA: Diagnosis not present

## 2024-01-17 DIAGNOSIS — Z8379 Family history of other diseases of the digestive system: Secondary | ICD-10-CM | POA: Insufficient documentation

## 2024-01-17 DIAGNOSIS — R195 Other fecal abnormalities: Secondary | ICD-10-CM | POA: Diagnosis not present

## 2024-01-17 DIAGNOSIS — K5790 Diverticulosis of intestine, part unspecified, without perforation or abscess without bleeding: Secondary | ICD-10-CM | POA: Diagnosis not present

## 2024-01-17 DIAGNOSIS — Z950 Presence of cardiac pacemaker: Secondary | ICD-10-CM | POA: Insufficient documentation

## 2024-01-17 LAB — GASTROINTESTINAL PANEL BY PCR, STOOL (REPLACES STOOL CULTURE)

## 2024-01-17 LAB — IGA: IgA: 164 mg/dL (ref 64–422)

## 2024-01-17 LAB — MISC LABCORP TEST (SEND OUT)
Labcorp test code: 164010
Labcorp test code: 8656

## 2024-01-17 LAB — HM COLONOSCOPY

## 2024-01-17 SURGERY — COLONOSCOPY
Anesthesia: General

## 2024-01-17 MED ORDER — LIDOCAINE HCL (PF) 2 % IJ SOLN
INTRAMUSCULAR | Status: DC | PRN
  Administered 2024-01-17: 60 mg via INTRADERMAL

## 2024-01-17 MED ORDER — LACTATED RINGERS IV SOLN: INTRAVENOUS | Status: DC | PRN

## 2024-01-17 MED ORDER — PROPOFOL 500 MG/50ML IV EMUL
INTRAVENOUS | Status: DC | PRN
  Administered 2024-01-17: 125 ug/kg/min via INTRAVENOUS

## 2024-01-17 MED ORDER — PROPOFOL 10 MG/ML IV BOLUS
INTRAVENOUS | Status: DC | PRN
  Administered 2024-01-17 (×2): 30 mg via INTRAVENOUS
  Administered 2024-01-17: 40 mg via INTRAVENOUS
  Administered 2024-01-17: 70 mg via INTRAVENOUS

## 2024-01-17 MED ORDER — PROPOFOL 1000 MG/100ML IV EMUL
INTRAVENOUS | Status: AC
  Filled 2024-01-17: qty 100

## 2024-01-17 MED ORDER — LIDOCAINE HCL (PF) 2 % IJ SOLN
INTRAMUSCULAR | Status: AC
  Filled 2024-01-17: qty 5

## 2024-01-17 MED ORDER — PHENYLEPHRINE 80 MCG/ML (10ML) SYRINGE FOR IV PUSH (FOR BLOOD PRESSURE SUPPORT)
PREFILLED_SYRINGE | INTRAVENOUS | Status: DC | PRN
  Administered 2024-01-17 (×2): 160 ug via INTRAVENOUS

## 2024-01-17 NOTE — Discharge Instructions (Signed)

## 2024-01-17 NOTE — Anesthesia Postprocedure Evaluation (Signed)
 Anesthesia Post Note  Patient: Susan Hicks  Procedure(s) Performed: COLONOSCOPY EGD (ESOPHAGOGASTRODUODENOSCOPY)  Patient location during evaluation: PACU Anesthesia Type: General Level of consciousness: awake and alert Pain management: pain level controlled Vital Signs Assessment: post-procedure vital signs reviewed and stable Respiratory status: spontaneous breathing, nonlabored ventilation, respiratory function stable and patient connected to nasal cannula oxygen Cardiovascular status: blood pressure returned to baseline and stable Postop Assessment: no apparent nausea or vomiting Anesthetic complications: no   There were no known notable events for this encounter.   Last Vitals:  Vitals:   01/17/24 0859 01/17/24 0906  BP: (!) 98/40 112/71  Resp: 17   Temp: 36.4 C   SpO2: 97%     Last Pain:  Vitals:   01/17/24 0906  PainSc: 0-No pain                 Mandalyn Pasqua L Shaylie Eklund

## 2024-01-17 NOTE — Interval H&P Note (Signed)
 History and Physical Interval Note:  01/17/2024 7:32 AM  Susan Hicks  has presented today for surgery, with the diagnosis of DIARRHEA, CELIAC DISEASE.  The various methods of treatment have been discussed with the patient and family. After consideration of risks, benefits and other options for treatment, the patient has consented to  Procedure(s) with comments: COLONOSCOPY (N/A) - 9:30AM;ASA 1-2 EGD (ESOPHAGOGASTRODUODENOSCOPY) (N/A) - 9:30AM;ASA 1-2 as a surgical intervention.  The patient's history has been reviewed, patient examined, no change in status, stable for surgery.  I have reviewed the patient's chart and labs.  Questions were answered to the patient's satisfaction.     [name]

## 2024-01-17 NOTE — Transfer of Care (Addendum)
 Immediate Anesthesia Transfer of Care Note  Patient: Susan Hicks  Procedure(s) Performed: COLONOSCOPY EGD (ESOPHAGOGASTRODUODENOSCOPY)  Patient Location: Short Stay  Anesthesia Type:General  Level of Consciousness: drowsy and patient cooperative  Airway & Oxygen Therapy: Patient Spontanous Breathing  Post-op Assessment: Report given to RN and Post -op Vital signs reviewed and stable  Post vital signs: Reviewed and stable  Last Vitals:  Vitals Value Taken Time  BP 112/71 01/17/24   0906  Temp 36.4 01/17/24   0859  Pulse 61 01/17/24   0859  Resp 17 01/17/24   0859  SpO2 97% 01/17/24   0859    Last Pain:  Vitals:   01/17/24 0824  PainSc: 0-No pain         Complications: No notable events documented.

## 2024-01-17 NOTE — Op Note (Signed)
 [facility] Patient Name: Susan Hicks Procedure Date: 01/17/2024 8:12 AM MRN: [id number] Date of Birth: May 10, 1946 Attending MD: Sanjuan Dame , MD, [id number] CSN: [id number] Age: 78 Admit Type: Outpatient Procedure:                Colonoscopy Indications:              Chronic diarrhea Providers:                Sanjuan Dame, MD, Francoise Ceo RN, RN, Elinor Parkinson Referring MD:              Medicines:                Monitored Anesthesia Care Complications:            No immediate complications. Estimated Blood Loss:     Estimated blood loss was minimal. Procedure:                Pre-Anesthesia Assessment:                           - Prior to the procedure, a History and Physical                            was performed, and patient medications and                            allergies were reviewed. The patient's tolerance of                            previous anesthesia was also reviewed. The risks                            and benefits of the procedure and the sedation                            options and risks were discussed with the patient.                            All questions were answered, and informed consent                            was obtained. Prior Anticoagulants: The patient has                            taken no anticoagulant or antiplatelet agents. ASA                            Grade Assessment: II - A patient with mild systemic                            disease. After reviewing the risks and benefits,  the patient was deemed in satisfactory condition to                            undergo the procedure.                           After obtaining informed consent, the colonoscope                            was passed under direct vision. Throughout the                            procedure, the patient's blood pressure, pulse, and                            oxygen saturations were monitored  continuously. The                            [id number] scope was introduced through                            the anus and advanced to the the terminal ileum.                            The colonoscopy was performed without difficulty.                            The patient tolerated the procedure well. The                            quality of the bowel preparation was evaluated                            using the BBPS [facility] Bowel Preparation Scale)                            with scores of: Right Colon = 3, Transverse Colon =                            3 and Left Colon = 3 (entire mucosa seen well with                            no residual staining, small fragments of stool or                            opaque liquid). The total BBPS score equals 9. The                            terminal ileum, ileocecal valve, appendiceal                            orifice, and rectum were photographed. Scope In: 8:43:09 AM Scope Out: 8:56:08 AM Scope Withdrawal Time: 0 hours 9 minutes  23 seconds  Total Procedure Duration: 0 hours 12 minutes 59 seconds  Findings:      A few small-mouthed diverticula were found in the left colon.      An area of the terminal ileum was congested. Biopsies were taken with a       cold forceps for histology.      There is no endoscopic evidence of inflammation or polyps in the entire       colon. Biopsies for histology were taken with a cold forceps from the       entire colon for evaluation of microscopic colitis.      Non-bleeding internal hemorrhoids were found during retroflexion. The       hemorrhoids were small. Impression:               - Diverticulosis in the left colon.                           - Congested mucosa in the terminal ileum. Biopsied.                           - Non-bleeding internal hemorrhoids. Moderate Sedation:      Per Anesthesia Care Recommendation:           - Patient has a contact number available for                             emergencies. The signs and symptoms of potential                            delayed complications were discussed with the                            patient. Return to normal activities tomorrow.                            Written discharge instructions were provided to the                            patient.                           - Resume previous diet.                           - Continue present medications.                           - Await pathology results.                           - No repeat colonoscopy due to current age (72                            years or older).                           - Return to GI clinic as previously scheduled. Procedure Code(s):        ---  Professional ---                           [phone], Colonoscopy, flexible; with biopsy, single                            or multiple Diagnosis Code(s):        --- Professional ---                           K64.8, Other hemorrhoids                           K63.89, Other specified diseases of intestine                           K52.9, Noninfective gastroenteritis and colitis,                            unspecified                           K57.30, Diverticulosis of large intestine without                            perforation or abscess without bleeding CPT copyright 2022 American Medical Association. All rights reserved. The codes documented in this report are preliminary and upon coder review may  be revised to meet current compliance requirements. Sanjuan Dame, MD Sanjuan Dame, MD 01/17/2024 8:59:22 AM This report has been signed electronically. Number of Addenda: 0

## 2024-01-17 NOTE — Progress Notes (Signed)
 Discussed with patient in person   Will obtain total IgG and trend liver enzymes , if continues to be elevated will obtain liver biopsy in future for evaluation of AIH given positive ANA and weakly positive ASMA ====================  AST 51 ALT 41 alk phos 54 (liver enzymes have trended down) ASMA weakly positive: 21 ANA positive Negative AMA TSH 1.3 Hep A nonimmune Hep B nonimmune nonexposed HIV negative Hepatitis C negative INR 1.0 CRP 0.7 Ferritin 29 Hemoglobin 12.4 C. difficile negative Ultrasound with fatty liver disease

## 2024-01-17 NOTE — Op Note (Signed)
 [facility] Patient Name: Susan Hicks Procedure Date: 01/17/2024 8:13 AM MRN: [id number] Date of Birth: 1945-10-19 Attending MD: Sanjuan Dame , MD, [id number] CSN: [id number] Age: 78 Admit Type: Outpatient Procedure:                Upper GI endoscopy Indications:              Endoscopy to assess diarrhea in patient suspected                            of having celiac disease Providers:                Sanjuan Dame, MD, Francoise Ceo RN, RN, Elinor Parkinson Referring MD:              Medicines:                Monitored Anesthesia Care Complications:            No immediate complications. Estimated Blood Loss:     Estimated blood loss was minimal. Procedure:                Pre-Anesthesia Assessment:                           - Prior to the procedure, a History and Physical                            was performed, and patient medications and                            allergies were reviewed. The patient's tolerance of                            previous anesthesia was also reviewed. The risks                            and benefits of the procedure and the sedation                            options and risks were discussed with the patient.                            All questions were answered, and informed consent                            was obtained. Prior Anticoagulants: The patient has                            taken no anticoagulant or antiplatelet agents. ASA                            Grade Assessment: II - A patient with mild systemic  disease. After reviewing the risks and benefits,                            the patient was deemed in satisfactory condition to                            undergo the procedure.                           After obtaining informed consent, the endoscope was                            passed under direct vision. Throughout the                            procedure, the  patient's blood pressure, pulse, and                            oxygen saturations were monitored continuously. The                            [id number] ([id number] scope was introduced through the                            mouth, and advanced to the second part of duodenum.                            The upper GI endoscopy was accomplished without                            difficulty. The patient tolerated the procedure                            well. Scope In: 8:30:01 AM Scope Out: 8:37:26 AM Total Procedure Duration: 0 hours 7 minutes 25 seconds  Findings:      The examined esophagus was normal.      Mildly erythematous mucosa without bleeding was found in the gastric       antrum. Biopsies were taken with a cold forceps for histology.      Multiple small sessile polyps with no stigmata of recent bleeding were       found in the stomach.      The duodenal bulb and second portion of the duodenum were normal.       Biopsies for histology were taken with a cold forceps for evaluation of       celiac disease. Impression:               - Normal esophagus.                           - Erythematous mucosa in the antrum. Biopsied.                           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                           - Normal duodenal bulb and second portion of the                            duodenum. Biopsied x 8 Moderate Sedation:      Per Anesthesia Care Recommendation:           - Patient has a contact number available for                            emergencies. The signs and symptoms of potential                            delayed complications were discussed with the                            patient. Return to normal activities tomorrow.                            Written discharge instructions were provided to the                            patient.                           - Resume previous diet.                           -  Continue present medications.                           - Await pathology results. Procedure Code(s):        --- Professional ---                           [id number], Esophagogastroduodenoscopy, flexible,                            transoral; with biopsy, single or multiple Diagnosis Code(s):        --- Professional ---                           K31.89, Other diseases of stomach and duodenum                           R19.7, Diarrhea, unspecified CPT copyright 2022 American Medical Association. All rights reserved. The codes documented in this report are preliminary and upon coder review may  be revised to meet current compliance requirements. Sanjuan Dame, MD Sanjuan Dame, MD 01/17/2024 8:42:08 AM This report has been signed electronically. Number of Addenda: 0

## 2024-01-17 NOTE — Telephone Encounter (Signed)
 Rosalita Chessman at lab from [facility] - phone [phone] called because they need another order put in for fecal leukocytes stool test. They gave her a white vial and it needed to be pink and grey vial and the lab rejected it. She said they would call the patient to let her know. They need another order put in for the test and I put in the order and let suzanne at [facility] lab. She said patient had been notified and was returning to lab next week. Susan Hicks

## 2024-01-18 ENCOUNTER — Encounter (HOSPITAL_COMMUNITY): Payer: Self-pay | Admitting: Gastroenterology

## 2024-01-18 LAB — SURGICAL PATHOLOGY

## 2024-01-18 LAB — H. PYLORI ANTIGEN, STOOL: H. Pylori Stool Ag, Eia: NEGATIVE

## 2024-01-18 LAB — CALPROTECTIN, FECAL: Calprotectin, Fecal: 10 ug/g (ref 0–120)

## 2024-01-19 LAB — GLIA (IGA/G) + TTG IGA
Antigliadin Abs, IgA: 3 U (ref 0–19)
Gliadin IgG: 3 U (ref 0–19)
Tissue Transglutaminase Ab, IgA: <2 U/mL (ref 0–3)

## 2024-01-22 LAB — MISC LABCORP TEST (SEND OUT): Labcorp test code: 167082

## 2024-01-23 ENCOUNTER — Other Ambulatory Visit (HOSPITAL_COMMUNITY)
Admission: RE | Admit: 2024-01-23 | Discharge: 2024-01-23 | Disposition: A | Discharge status: Home / Self Care | Source: Ambulatory Visit | Attending: Gastroenterology | Admitting: Gastroenterology

## 2024-01-23 DIAGNOSIS — K529 Noninfective gastroenteritis and colitis, unspecified: Secondary | ICD-10-CM | POA: Diagnosis not present

## 2024-01-23 DIAGNOSIS — R748 Abnormal levels of other serum enzymes: Secondary | ICD-10-CM | POA: Diagnosis not present

## 2024-01-24 ENCOUNTER — Other Ambulatory Visit (HOSPITAL_COMMUNITY): Payer: Self-pay | Admitting: Gastroenterology

## 2024-01-24 DIAGNOSIS — K529 Noninfective gastroenteritis and colitis, unspecified: Secondary | ICD-10-CM

## 2024-01-24 MED ORDER — COLESTIPOL HCL 1 G PO TABS: 1.0000 g | ORAL_TABLET | Freq: Two times a day (BID) | ORAL | 5 refills | Status: DC

## 2024-01-24 NOTE — Progress Notes (Signed)
 I reviewed the pathology results. Ann, can you send her a letter with the findings as described below please?  Thanks,  Vista Lawman, MD Gastroenterology and Hepatology [facility]  ---------------------------------------------------------------------------------------------  [facility] [address], [address], [place], [place] [address] Phone:  [phone]   01/24/24 [place], [place]   Dear Susan Hicks,  I am writing to inform you that the biopsies taken during your recent endoscopic examination showed:  A. SMALL BOWEL, BIOPSY:  -  Duodenal mucosa with no significant pathology.   B. STOMACH, BIOPSY:  Antral/oxyntic mucosa with mild chemical/reactive change.  -  No Helicobacter pylori organisms identified on HE stained slide.   C. COLON, RANDOM, BIOPSY:  -  Colonic mucosa with no significant pathology.   D. TERMINAL ILEUM, BIOPSY:  Small intestinal mucosa with no significant pathology.    What does this mean?  Blood work and even the biopsies of the small bowel did NOT show any evidence of celiac disease  Biopsy of the colon was also negative for any inflammation   I look forward to see you in clinic , for now I am prescribing cholestipol which I recommend to take it 4 hours apart from any other medication to control your diarrhea   Also I value your feedback , so if you get a survey , please take the time to fill it out and thank you for choosing [facility]  Please call us at [phone] if you have persistent problems or have questions about your condition that have not been fully answered at this time.  Sincerely,  Vista Lawman, MD Gastroenterology and Hepatology  ========================  Abdominal xray without evidence of constipation C Diff negative Fecal Calpro: 10  H.Pylori stool Ag  negative GI PCR negative DQ8 POSITIVE  Celiac Panel negative

## 2024-01-25 ENCOUNTER — Encounter (INDEPENDENT_AMBULATORY_CARE_PROVIDER_SITE_OTHER): Payer: Self-pay | Admitting: *Deleted

## 2024-01-25 DIAGNOSIS — K5790 Diverticulosis of intestine, part unspecified, without perforation or abscess without bleeding: Secondary | ICD-10-CM | POA: Diagnosis not present

## 2024-01-25 DIAGNOSIS — R0981 Nasal congestion: Secondary | ICD-10-CM | POA: Diagnosis not present

## 2024-01-25 DIAGNOSIS — Z299 Encounter for prophylactic measures, unspecified: Secondary | ICD-10-CM | POA: Diagnosis not present

## 2024-01-25 DIAGNOSIS — I10 Essential (primary) hypertension: Secondary | ICD-10-CM | POA: Diagnosis not present

## 2024-01-25 NOTE — Addendum Note (Signed)
 Addended by: Elease Etienne A on: 01/25/2024 08:26 AM   Modules accepted: Orders

## 2024-01-25 NOTE — Progress Notes (Signed)
 Remote pacemaker transmission.

## 2024-01-29 ENCOUNTER — Telehealth (INDEPENDENT_AMBULATORY_CARE_PROVIDER_SITE_OTHER): Payer: Self-pay | Admitting: Gastroenterology

## 2024-01-29 NOTE — Telephone Encounter (Signed)
-----   Message from Lidia Reels sent at 01/17/2024  9:06 AM EDT ----- Regarding: RE: appt Waiting for May schedule to open. ----- Message ----- From: Hargis Lias, MD Sent: 01/17/2024   9:02 AM EDT To: Donney Gala Subject: appt                                           Hi Amalia Badder,  Can you please schedule a follow up appointment for this patient in 4-6 weeks with me?  Thanks,  Muhammad Faizan Ahmed , MD Gastroenterology and Hepatology [facility]enter[facility]

## 2024-01-29 NOTE — Telephone Encounter (Signed)
 Pt is aware of OV on 5/23 at 1010 with Dr Alita Irwin

## 2024-01-30 LAB — MISC LABCORP TEST (SEND OUT): Labcorp test code: 8656

## 2024-01-31 ENCOUNTER — Encounter: Admitting: *Deleted

## 2024-02-05 ENCOUNTER — Encounter: Payer: Self-pay | Admitting: *Deleted

## 2024-02-05 ENCOUNTER — Other Ambulatory Visit: Payer: Self-pay | Admitting: *Deleted

## 2024-02-05 ENCOUNTER — Ambulatory Visit: Payer: Medicare Other | Admitting: Nurse Practitioner

## 2024-02-05 ENCOUNTER — Other Ambulatory Visit: Payer: Self-pay

## 2024-02-05 DIAGNOSIS — L820 Inflamed seborrheic keratosis: Secondary | ICD-10-CM | POA: Diagnosis not present

## 2024-02-05 DIAGNOSIS — Z299 Encounter for prophylactic measures, unspecified: Secondary | ICD-10-CM | POA: Diagnosis not present

## 2024-02-05 DIAGNOSIS — D485 Neoplasm of uncertain behavior of skin: Secondary | ICD-10-CM | POA: Diagnosis not present

## 2024-02-05 DIAGNOSIS — I10 Essential (primary) hypertension: Secondary | ICD-10-CM | POA: Diagnosis not present

## 2024-02-05 NOTE — Patient Outreach (Signed)
 Complex Care Management   Visit Note  02/05/2024  Name:  Susan Hicks MRN: [id number] DOB: January 25, 1946  Situation: Referral received for Complex Care Management related to  hypertension and elevated liver enzymes.  I obtained verbal consent from Patient.  Visit completed with Ermalinda Hays  on the phone  Background:   Past Medical History:  Diagnosis Date   Depression    Fibromyalgia    now considered mixed connective tissue disorder   Generalized anxiety disorder 08/21/2018   GERD (gastroesophageal reflux disease)    Hypertension    Irritable bowel    Junctional bradycardia    a. 2017 - beta blocker discontinued.   LBBB (left bundle branch block)    Mixed connective tissue disease (HCC)    Obstructive sleep apnea treated with continuous positive airway pressure (CPAP) 08/21/2018   Osteoporosis    Premature atrial contractions    Primary localized osteoarthritis of right knee     Assessment: Patient Reported Symptoms:  Cognitive Cognitive Status: Alert and oriented to person, place, and time, Normal speech and language skills Cognitive/Intellectual Conditions Management [RPT]: None reported or documented in medical history or problem list   Health Maintenance Behaviors: Healthy diet, Annual physical exam Healing Pattern: Average Health Facilitated by: Healthy diet  Neurological Neurological Review of Symptoms: No symptoms reported    HEENT HEENT Symptoms Reported: No symptoms reported      Cardiovascular Cardiovascular Symptoms Reported: No symptoms reported Does patient have uncontrolled Hypertension?: No Cardiovascular Conditions: Hypertension Cardiovascular Management Strategies: Routine screening, Medication therapy, Diet modification Cardiovascular Self-Management Outcome: 4 (good)  Respiratory Respiratory Symptoms Reported: No symptoms reported    Endocrine Patient reports the following symptoms related to hypoglycemia or hyperglycemia : No symptoms  reported    Gastrointestinal Gastrointestinal Symptoms Reported: Other Other Gastrointestinal Symptoms: Loose stools and urgency. Improving since she has resumed eliminating gluten from her diet. Gastrointestinal Management Strategies: Diet modification Gastrointestinal Self-Management Outcome: 4 (good) Gastrointestinal Comment: Recent biopsies from endoscopy was negative for any pathology, incluing celiac disease Nutrition Risk Screen (CP): No indicators present  Genitourinary Genitourinary Symptoms Reported: No symptoms reported    Integumentary Integumentary Symptoms Reported: No symptoms reported    Musculoskeletal Musculoskelatal Symptoms Reviewed: No symptoms reported   Falls in the past year?: No Patient at Risk for Falls Due to: No Fall Risks  Psychosocial Psychosocial Symptoms Reported: Not assessed     Quality of Family Relationships: supportive, helpful, involved       No data to display          There were no vitals filed for this visit.  Medications Reviewed Today     Reviewed by Ethelene Herald, RN (Registered Nurse) on 02/05/24 at [phone]  Med List Status: <None>   Medication Order Taking? Sig Documenting Provider Last Dose Status Informant  albuterol (VENTOLIN HFA) 108 (90 Base) MCG/ACT inhaler [id number]  Inhale 1-2 puffs into the lungs as needed for wheezing or shortness of breath. [provider]  Active   Artificial Tear Solution (SOOTHE XP OP) [id number]  Place 1 drop into both eyes in the morning and at bedtime. [provider]  Active Self  b complex vitamins tablet [id number]  Take 1 tablet by mouth daily. [provider]  Active Self  busPIRone  (BUSPAR ) 15 MG tablet [id number]  Take 15 mg by mouth 2 (two) times daily. [provider]  Active Self           Med Note (Venetta Gill, ANNA E  Wed Aug 21, 2018  1:29 PM)    Candesartan Cilexetil-HCTZ 32-25 MG TABS [id number]  Take 1 tablet by mouth daily. [provider]   Active   cetirizine (ZYRTEC) 10 MG tablet [id number]  Take 10 mg by mouth daily. [provider]  Active Self  chlorpheniramine (CHLOR-TRIMETON) 4 MG tablet [id number]  Take 4 mg by mouth at bedtime as needed for allergies. [provider]  Active Self  Cholecalciferol  (VITAMIN D3) 2000 units TABS [id number]  Take 2,000 Units by mouth daily.  [provider]  Active Self  colestipol  (COLESTID ) 1 g tablet [id number]  Take 1 tablet (1 g total) by mouth 2 (two) times daily. Ahmed, Muhammad F, MD  Active   cycloSPORINE (RESTASIS) 0.05 % ophthalmic emulsion [id number]  1 drop 2 (two) times daily. [provider]  Active   diphenhydramine -acetaminophen  (TYLENOL  PM) 25-500 MG TABS tablet [id number]  Take 1 tablet by mouth at bedtime.  [provider]  Active Self  escitalopram (LEXAPRO) 20 MG tablet [id number]  Take 20 mg by mouth daily. [provider]  Active   famotidine (PEPCID) 20 MG tablet [id number]  Take 20 mg by mouth at bedtime. [provider]  Active   hydrALAZINE  (APRESOLINE ) 25 MG tablet [id number]  Take 25 mg by mouth 3 (three) times daily. [provider]  Active   LORazepam  (ATIVAN ) 0.5 MG tablet [id number]  Take 0.5 mg by mouth at bedtime. [provider]  Active Self  Magnesium  400 MG CAPS [id number]  Take 400 mg by mouth at bedtime. [provider]  Active Self  melatonin 5 MG TABS [id number]  Take 5 mg by mouth at bedtime. [provider]  Active Self  meloxicam (MOBIC) 7.5 MG tablet [id number]  Take 7.5 mg by mouth in the morning and at bedtime. [provider]  Active Self  metoprolol  succinate (TOPROL -XL) 50 MG 24 hr tablet [id number]  TAKE 1 TABLET BY MOUTH DAILY  WITH OR IMMEDIATELY FOLLOWING A  MEAL Lasalle Pointer, NP  Active   Misc Natural Products (BEET ROOT) 500 MG CAPS [id number]  Take 1 capsule by mouth daily. [provider]  Active   Multiple Vitamins-Minerals (ADULT  GUMMY) CHEW [id number]  Chew 2 tablets by mouth daily. [provider]  Active Self  Multiple Vitamins-Minerals (PRESERVISION AREDS PO) [id number]  Take 1 tablet by mouth daily. [provider]  Active   oxybutynin  (DITROPAN ) 5 MG tablet [id number]  Take 5 mg by mouth daily. [provider]  Active Self  pantoprazole  (PROTONIX ) 40 MG tablet [id number]  Take 40 mg by mouth daily. [provider]  Active Self  PRESCRIPTION MEDICATION [id number]  Inhale into the lungs at bedtime. CPAP [provider]  Active Self  rosuvastatin (CRESTOR) 5 MG tablet [id number]  Take 5 mg by mouth once a week. [provider]  Active   traMADol  (ULTRAM ) 50 MG tablet [id number]  Take 50 mg by mouth every 6 (six) hours as needed for moderate pain (hip / back pain). [provider]  Active Self  Turmeric Curcumin 500 MG CAPS [id number]  Take 500 mg by mouth at bedtime. [provider]  Active Self            Recommendation:   PCP Follow-up Specialty provider follow-up gastroenterology as needed  Follow Up Plan:   Patient has met all care management goals. Care Management case will be closed. Patient has been provided contact information should new needs arise.  Michele Ahle, RN, BSN [place]  [facility] RN Care Manager Direct Dial: [phone]  Fax: [phone]

## 2024-02-05 NOTE — Patient Instructions (Signed)
 Visit Information  Thank you for taking time to visit with me today. Please don't hesitate to contact me if I can be of assistance to you before our next scheduled appointment.  Patient has met all care management goals. Care Management case will be closed. Patient has been provided contact information should new needs arise.   Please call the care guide team at [phone] if you need to cancel, schedule, or reschedule an appointment.   Please call the [place]  National Suicide Prevention Lifeline: 726-367-4624 or TTY: (323)742-9467 TTY (757)572-3886) to talk to a trained counselor call the [facility]: [phone] call 911 if you are experiencing a Mental Health or Behavioral Health Crisis or need someone to talk to.  Michele Ahle, RN, BSN [place]  [facility] RN Care Manager Direct Dial: [phone]  Fax: [phone]

## 2024-02-13 DIAGNOSIS — I10 Essential (primary) hypertension: Secondary | ICD-10-CM | POA: Diagnosis not present

## 2024-02-14 ENCOUNTER — Other Ambulatory Visit: Payer: Self-pay | Admitting: Internal Medicine

## 2024-02-14 DIAGNOSIS — Z1231 Encounter for screening mammogram for malignant neoplasm of breast: Secondary | ICD-10-CM

## 2024-02-15 ENCOUNTER — Encounter: Payer: Self-pay | Admitting: Cardiovascular Disease

## 2024-02-15 ENCOUNTER — Ambulatory Visit: Payer: Medicare Other | Attending: Cardiovascular Disease | Admitting: Cardiovascular Disease

## 2024-02-15 VITALS — BP 140/70 | HR 69 | Ht 63.0 in | Wt 148.2 lb

## 2024-02-15 DIAGNOSIS — R001 Bradycardia, unspecified: Secondary | ICD-10-CM

## 2024-02-15 DIAGNOSIS — G4733 Obstructive sleep apnea (adult) (pediatric): Secondary | ICD-10-CM | POA: Diagnosis not present

## 2024-02-15 LAB — CUP PACEART INCLINIC DEVICE CHECK
Date Time Interrogation Session: 20250502155924
Implantable Lead Connection Status: 753985
Implantable Lead Connection Status: 753985
Implantable Lead Implant Date: 20210909
Implantable Lead Implant Date: 20210909
Implantable Lead Location: 753859
Implantable Lead Location: 753860
Implantable Lead Model: 3830
Implantable Lead Model: 5076
Implantable Pulse Generator Implant Date: 20210909

## 2024-02-15 NOTE — Patient Instructions (Addendum)
 Medication Instructions:   Continue all current medications.   Labwork:  none  Testing/Procedures:  none  Follow-Up:  Your physician wants you to follow up in:  1 year.  You should receive a recall letter in the mail about 2 months prior to the time you are due.  If you don't receive this, please call our office to schedule your follow up appointment.      Any Other Special Instructions Will Be Listed Below (If Applicable).  You have been referred to:  Pulmonology   If you need a refill on your cardiac medications before your next appointment, please call your pharmacy.

## 2024-02-15 NOTE — Progress Notes (Signed)
    PCP: Orlena Bitters, MD Primary Cardiologist: Dr Amanda Jungling Primary EP:  Dr Arlester Ladd  Susan Hicks is a 78 y.o. female who presents today for routine electrophysiology followup.  Since last being seen in our clinic, the patient reports doing very well.  Today, she denies symptoms of palpitations, chest pain, shortness of breath,  lower extremity edema, dizziness, presyncope, or syncope.  The patient is otherwise without complaint today.      Physical Exam: Vitals:   02/15/24 1522  BP: (!) 140/70  Pulse: 69  SpO2: 98%  Weight: 148 lb 3.2 oz (67.2 kg)  Height: 5\' 3"  (1.6 m)    Gen: Appears comfortable, well-nourished CV: RRR, no dependent edema The device site is normal -- no tenderness, edema, drainage, redness, threatened erosion. Pulm: breathing easily   Pacemaker interrogation- reviewed in detail today,  See PACEART report    Assessment and Plan:  1. 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 HTN Will refill metoprolol    3. OSA Compliance with therapy is advised She requested a referral for sleep/pulmonary to help adjust her CPAP  Return in a year  Efraim Grange, MD 02/15/2024 3:44 PM

## 2024-02-19 ENCOUNTER — Ambulatory Visit
Admission: RE | Admit: 2024-02-19 | Discharge: 2024-02-19 | Disposition: A | Discharge status: Home / Self Care | Source: Ambulatory Visit | Attending: Internal Medicine | Admitting: Internal Medicine

## 2024-02-19 DIAGNOSIS — Z1231 Encounter for screening mammogram for malignant neoplasm of breast: Secondary | ICD-10-CM

## 2024-02-22 ENCOUNTER — Other Ambulatory Visit: Payer: Self-pay | Admitting: Internal Medicine

## 2024-02-22 DIAGNOSIS — R928 Other abnormal and inconclusive findings on diagnostic imaging of breast: Secondary | ICD-10-CM

## 2024-02-26 ENCOUNTER — Ambulatory Visit: Payer: Self-pay | Admitting: Cardiovascular Disease

## 2024-03-07 ENCOUNTER — Encounter (INDEPENDENT_AMBULATORY_CARE_PROVIDER_SITE_OTHER): Payer: Self-pay | Admitting: Gastroenterology

## 2024-03-07 ENCOUNTER — Ambulatory Visit (INDEPENDENT_AMBULATORY_CARE_PROVIDER_SITE_OTHER): Admitting: Gastroenterology

## 2024-03-07 VITALS — BP 158/78 | HR 66 | Temp 97.8°F | Ht 63.0 in | Wt 145.9 lb

## 2024-03-07 DIAGNOSIS — Z8379 Family history of other diseases of the digestive system: Secondary | ICD-10-CM

## 2024-03-07 DIAGNOSIS — R7989 Other specified abnormal findings of blood chemistry: Secondary | ICD-10-CM | POA: Diagnosis not present

## 2024-03-07 DIAGNOSIS — K529 Noninfective gastroenteritis and colitis, unspecified: Secondary | ICD-10-CM

## 2024-03-07 DIAGNOSIS — R748 Abnormal levels of other serum enzymes: Secondary | ICD-10-CM | POA: Insufficient documentation

## 2024-03-07 DIAGNOSIS — I10 Essential (primary) hypertension: Secondary | ICD-10-CM | POA: Insufficient documentation

## 2024-03-07 MED ORDER — COLESTIPOL HCL 1 G PO TABS: 1.0000 g | ORAL_TABLET | Freq: Two times a day (BID) | ORAL | 5 refills | Status: DC

## 2024-03-07 NOTE — Patient Instructions (Signed)
 It was very nice to meet you today, as dicussed with will plan for the following :  1) labwork

## 2024-03-07 NOTE — Progress Notes (Signed)
 Susan Hicks , M.D. Gastroenterology & Hepatology [facility] Gastroenterology [address] [place], [place] [address] Primary Care Physician: Orlena Bitters, MD [address] [place] [address]  Chief Complaint: Chronic diarrhea and elevated liver enzymes  History of Present Illness:  Susan Hicks is a 78 y.o. female with symptomatic bradycardia, s/p PPM in 2021, OSA on CPAP, hypertension, hyperlipidemia, and CKD stage III , IBS, mixed connective tissue disorder who presents for evaluation of chronic diarrhea in setting of family history of celiac disease and elevated liver enzymes.  Patient reports chronic diarrhea since age 24.  She reports that she has 3-4 liquid bowel movements while taking Imodium  daily.  Patient reports family history of celiac disease and granddaughter and other family members who appears to be on gluten-free diet although without any formal evaluation.  Patient herself is on gluten-free diet and would notice if she eats gluten her symptoms of gotten worse.  Denies any hard stools or straining.  Regarding elevated liver enzymes patient does not take any herbal medications or use alcohol .  The only new medication she was started on was leflunomide which was started 2 years back rather  Patient reports since starting colestipol  diarrhea has significantly improved and she has been on gluten-free diet  The patient denies having any nausea, vomiting, fever, chills, hematochezia, melena, hematemesis, jaundice, pruritus or weight loss.  Last EGD:01/2024  Normal esophagus. - Erythematous mucosa in the antrum. Biopsied. - Multiple gastric polyps typical of fundic gland polyp  - Normal duodenal bulb and second portion of the duodenum. Biopsied x 8   Path :  A. SMALL BOWEL, BIOPSY:  -  Duodenal mucosa with no significant pathology.    B. STOMACH, BIOPSY:  Antral/oxyntic mucosa with mild chemical/reactive change.  -  No  Helicobacter pylori organisms identified on HE stained slide.    C. COLON, RANDOM, BIOPSY:  -  Colonic mucosa with no significant pathology.    D. TERMINAL ILEUM, BIOPSY:  Small intestinal mucosa with no significant pathology.     Last Colonoscopy:01/2024  - Diverticulosis in the left colon. - Congested mucosa in the terminal ileum. Biopsied. - Non- bleeding internal hemorrhoids.   FHx: Granddaughter with celiac disease, many other family members on gluten-free diet but never had formal evaluation for celiac disease Social: neg smoking, alcohol  or illicit drug use Surgical: no abdominal surgeries  Last labs from 11/2023 AST 277 ALT 323 alk phos 121 Liver enzymes were normal in 2024 Past Medical History: Past Medical History:  Diagnosis Date   Depression    Fibromyalgia    now considered mixed connective tissue disorder   Generalized anxiety disorder 08/21/2018   GERD (gastroesophageal reflux disease)    Hypertension    Irritable bowel    Junctional bradycardia    a. 2017 - beta blocker discontinued.   LBBB (left bundle branch block)    Mixed connective tissue disease (HCC)    Obstructive sleep apnea treated with continuous positive airway pressure (CPAP) 08/21/2018   Osteoporosis    Premature atrial contractions    Primary localized osteoarthritis of right knee     Past Surgical History: Past Surgical History:  Procedure Laterality Date   BACK SURGERY     BREAST CYST EXCISION     CHOLECYSTECTOMY     COLONOSCOPY N/A 01/17/2024   Procedure: COLONOSCOPY;  Surgeon: Hargis Lias, MD;  Location: [facility];  Service: Endoscopy;  Laterality: N/A;  9:30AM;ASA 1-2   ESOPHAGOGASTRODUODENOSCOPY  N/A 01/17/2024   Procedure: EGD (ESOPHAGOGASTRODUODENOSCOPY);  Surgeon: Hargis Lias, MD;  Location: [facility];  Service: Endoscopy;  Laterality: N/A;  9:30AM;ASA 1-2   HAND SURGERY  2006   PACEMAKER IMPLANT N/A 06/24/2020   Procedure: PACEMAKER IMPLANT;  Surgeon: Jolly Needle, MD;  Location: [facility] INVASIVE CV LAB;  Service: Cardiovascular;  Laterality: N/A;   TOTAL KNEE ARTHROPLASTY Right 09/02/2018   Procedure: TOTAL KNEE ARTHROPLASTY;  Surgeon: Elly Habermann, MD;  Location[facility];  Service: Orthopedics;  Laterality: Right;    Family History: Family History  Problem Relation Age of Onset   Thyroid  disease Mother    Hypertension Mother    Heart attack Father    Pulmonary disease Father    Hypertension Father    Thyroid  disease Sister    Hypertension Sister    Hypertension Daughter    Hypertension Son    Breast cancer Neg Hx     Social History: Social History   Tobacco Use  Smoking Status Never  Smokeless Tobacco Never   Social History   Substance and Sexual Activity  Alcohol  Use No   Alcohol /week: 0.0 standard drinks of alcohol    Social History   Substance and Sexual Activity  Drug Use No    Allergies: Allergies  Allergen Reactions   Lisinopril Cough   Beta Adrenergic Blockers Other (See Comments)    Bradycardia - bottomed out heart rate    Gluten Meal Diarrhea    cramps   Penicillins Hives   Norvasc [Amlodipine] Other (See Comments)    HEADACHE     Medications: Current Outpatient Medications  Medication Sig Dispense Refill   albuterol (VENTOLIN HFA) 108 (90 Base) MCG/ACT inhaler Inhale 1-2 puffs into the lungs as needed for wheezing or shortness of breath.     Artificial Tear Solution (SOOTHE XP OP) Place 1 drop into both eyes in the morning and at bedtime.     b complex vitamins tablet Take 1 tablet by mouth daily.     busPIRone  (BUSPAR ) 15 MG tablet Take 15 mg by mouth 2 (two) times daily.     Candesartan Cilexetil-HCTZ 32-25 MG TABS Take 1 tablet by mouth daily.     cetirizine (ZYRTEC) 10 MG tablet Take 10 mg by mouth daily.     chlorpheniramine (CHLOR-TRIMETON) 4 MG tablet Take 4 mg by mouth at bedtime as needed for allergies.     Cholecalciferol  (VITAMIN D3) 2000 units TABS Take 2,000 Units by mouth daily.       colestipol  (COLESTID ) 1 g tablet Take 1 tablet (1 g total) by mouth 2 (two) times daily. 60 tablet 5   cycloSPORINE (RESTASIS) 0.05 % ophthalmic emulsion 1 drop 2 (two) times daily.     diphenhydramine -acetaminophen  (TYLENOL  PM) 25-500 MG TABS tablet Take 1 tablet by mouth at bedtime.      escitalopram (LEXAPRO) 20 MG tablet Take 20 mg by mouth daily.     famotidine (PEPCID) 20 MG tablet Take 20 mg by mouth at bedtime.     hydrALAZINE  (APRESOLINE ) 25 MG tablet Take 25 mg by mouth 3 (three) times daily.     LORazepam  (ATIVAN ) 0.5 MG tablet Take 0.5 mg by mouth at bedtime.     Magnesium  400 MG CAPS Take 400 mg by mouth at bedtime.     melatonin 5 MG TABS Take 5 mg by mouth at bedtime.     meloxicam (MOBIC) 7.5 MG tablet Take 7.5 mg by mouth in the morning and at bedtime.  metoprolol  succinate (TOPROL -XL) 50 MG 24 hr tablet TAKE 1 TABLET BY MOUTH DAILY  WITH OR IMMEDIATELY FOLLOWING A  MEAL 100 tablet 2   Misc Natural Products (BEET ROOT) 500 MG CAPS Take 1 capsule by mouth daily.     Multiple Vitamins-Minerals (ADULT GUMMY) CHEW Chew 2 tablets by mouth daily.     Multiple Vitamins-Minerals (PRESERVISION AREDS PO) Take 1 tablet by mouth daily.     oxybutynin  (DITROPAN ) 5 MG tablet Take 5 mg by mouth daily.     pantoprazole  (PROTONIX ) 40 MG tablet Take 40 mg by mouth daily.     PRESCRIPTION MEDICATION Inhale into the lungs at bedtime. CPAP     rosuvastatin (CRESTOR) 5 MG tablet Take 5 mg by mouth once a week.     traMADol  (ULTRAM ) 50 MG tablet Take 50 mg by mouth every 6 (six) hours as needed for moderate pain (hip / back pain).     Turmeric Curcumin 500 MG CAPS Take 500 mg by mouth at bedtime.     No current facility-administered medications for this visit.    Review of Systems: GENERAL: negative for malaise, night sweats HEENT: No changes in hearing or vision, no nose bleeds or other nasal problems. NECK: Negative for lumps, goiter, pain and significant neck swelling RESPIRATORY:  Negative for cough, wheezing CARDIOVASCULAR: Negative for chest pain, leg swelling, palpitations, orthopnea GI: SEE HPI MUSCULOSKELETAL: Negative for joint pain or swelling, back pain, and muscle pain. SKIN: Negative for lesions, rash HEMATOLOGY Negative for prolonged bleeding, bruising easily, and swollen nodes. ENDOCRINE: Negative for cold or heat intolerance, polyuria, polydipsia and goiter. NEURO: negative for tremor, gait imbalance, syncope and seizures. The remainder of the review of systems is noncontributory.   Physical Exam: BP (!) 158/78 (BP Location: Right Arm, Patient Position: Sitting, Cuff Size: Normal)   Pulse 66   Temp 97.8 F (36.6 C) (Oral)   Ht 5\' 3"  (1.6 m)   Wt 145 lb 14.4 oz (66.2 kg)   SpO2 96%   BMI 25.85 kg/m  GENERAL: The patient is AO x3, in no acute distress. HEENT: Head is normocephalic and atraumatic. EOMI are intact. Mouth is well hydrated and without lesions. NECK: Supple. No masses LUNGS: Clear to auscultation. No presence of rhonchi/wheezing/rales. Adequate chest expansion HEART: RRR, normal s1 and s2. ABDOMEN: Soft, nontender, no guarding, no peritoneal signs, and nondistended. BS +. No masses.  Imaging/Labs: as above     Latest Ref Rng & Units 01/15/2024   12:42 PM 07/18/2020    7:18 PM 06/22/2020   11:08 AM  CBC  WBC 4.0 - 10.5 K/uL 5.1  5.0  3.4   Hemoglobin 12.0 - 15.0 g/dL 16.1  09.6  04.5   Hematocrit 36.0 - 46.0 % 36.9  35.3  33.4   Platelets 150 - 400 K/uL 193  261  241    Lab Results  Component Value Date   IRON 123 01/15/2024   TIBC 433 01/15/2024   FERRITIN 29 01/15/2024    I personally reviewed and interpreted the available labs, imaging and endoscopic files.  Workup for diarrhea  Abdominal xray without evidence of constipation C Diff negative Fecal Calpro: 10  H.Pylori stool Ag negative GI PCR negative DQ8 POSITIVE  Celiac Panel negative   Workup for liver enzymes   AST 51 ALT 41 alk phos 54 (liver enzymes have  trended down) ASMA weakly positive: 21 ANA positive Negative AMA TSH 1.3 Hep A nonimmune Hep B nonimmune nonexposed HIV negative Hepatitis  C negative INR 1.0 CRP 0.7 Ferritin 29 Hemoglobin 12.4 C. difficile negative Ultrasound with fatty liver disease   Impression and Plan: Susan Hicks is a 78 y.o. female with symptomatic bradycardia, s/p PPM in 2021, OSA on CPAP, hypertension, hyperlipidemia, and CKD stage III , IBS, mixed connective tissue disorder who presents for evaluation of chronic diarrhea in setting of family history of celiac disease and elevated liver enzymes.  #Chronic diarrhea/Positive DQ8/family history of Celiac disease  We had performed upper endoscopy with small bowel biopsy celiac protocol which was negative for evidence of villous blunting ( negative celiac disease) .  But today patient reports that she really did not do full gluten challenge as directed and had an upper endoscopy with small bowel biopsy may have been false negative  Being on gluten-free diet celiac panel can also be false negative  With lab work DQ8 being positive and worsening diarrhea with gluten exposure suspicion for celiac disease still remains high  Ideally to make diagnosis patient is to be on 6 to 8 weeks of gluten diet followed up with biopsies of small bowel which patient failed to do as above  Can continue gluten-free diet DEXA q 2 years  Recommend Family screening for FDR - Continue bile acid sequestrant at the significantly improved patient symptoms   #Elevated liver enzymes Hepatocellular pattern liver injury- trended down   Last labs from 11/2023 AST 277 ALT 323 alk phos 121 but on repeat trended down ( see above)   Will obtain total IgG and trend liver enzymes , if continues to be elevated will obtain liver biopsy in future for evaluation of AIH given positive ANA and weakly positive ASMA   #HTN The patient was found to have elevated blood pressure when vital  signs were checked in the office. The blood pressure was rechecked by the nursing staff and it was found be persistently elevated >140/90 mmHg. I personally advised to the patient to follow up closely with PCP for hypertension control.     All questions were answered.      Susan Admire Faizan Zafar Debrosse, MD Gastroenterology and Hepatology [facility] Gastroenterology   This chart has been completed using [facility] Dictation software, and while attempts have been made to ensure accuracy , certain words and phrases may not be transcribed as intended

## 2024-03-13 DIAGNOSIS — Z79899 Other long term (current) drug therapy: Secondary | ICD-10-CM | POA: Diagnosis not present

## 2024-03-13 DIAGNOSIS — I10 Essential (primary) hypertension: Secondary | ICD-10-CM | POA: Diagnosis not present

## 2024-03-13 DIAGNOSIS — R5383 Other fatigue: Secondary | ICD-10-CM | POA: Diagnosis not present

## 2024-03-13 DIAGNOSIS — E7800 Pure hypercholesterolemia, unspecified: Secondary | ICD-10-CM | POA: Diagnosis not present

## 2024-03-13 DIAGNOSIS — Z299 Encounter for prophylactic measures, unspecified: Secondary | ICD-10-CM | POA: Diagnosis not present

## 2024-03-13 DIAGNOSIS — M8580 Other specified disorders of bone density and structure, unspecified site: Secondary | ICD-10-CM | POA: Diagnosis not present

## 2024-03-13 DIAGNOSIS — E559 Vitamin D deficiency, unspecified: Secondary | ICD-10-CM | POA: Diagnosis not present

## 2024-03-15 DIAGNOSIS — I10 Essential (primary) hypertension: Secondary | ICD-10-CM | POA: Diagnosis not present

## 2024-03-20 ENCOUNTER — Ambulatory Visit: Payer: Self-pay | Admitting: Cardiovascular Disease

## 2024-03-20 ENCOUNTER — Ambulatory Visit (INDEPENDENT_AMBULATORY_CARE_PROVIDER_SITE_OTHER): Payer: Medicare Other

## 2024-03-20 DIAGNOSIS — R001 Bradycardia, unspecified: Secondary | ICD-10-CM | POA: Diagnosis not present

## 2024-03-20 LAB — CUP PACEART REMOTE DEVICE CHECK
Battery Remaining Longevity: 106 mo
Battery Voltage: 2.99 V
Brady Statistic AP VP Percent: 97.81 %
Brady Statistic AP VS Percent: 0.32 %
Brady Statistic AS VP Percent: 0.29 %
Brady Statistic AS VS Percent: 1.58 %
Brady Statistic RA Percent Paced: 98.07 %
Brady Statistic RV Percent Paced: 98.1 %
Date Time Interrogation Session: 20250605001743
Implantable Lead Connection Status: 753985
Implantable Lead Connection Status: 753985
Implantable Lead Implant Date: 20210909
Implantable Lead Implant Date: 20210909
Implantable Lead Location: 753859
Implantable Lead Location: 753860
Implantable Lead Model: 3830
Implantable Lead Model: 5076
Implantable Pulse Generator Implant Date: 20210909
Lead Channel Impedance Value: 361 Ohm
Lead Channel Impedance Value: 380 Ohm
Lead Channel Impedance Value: 494 Ohm
Lead Channel Impedance Value: 513 Ohm
Lead Channel Pacing Threshold Amplitude: 0.75 V
Lead Channel Pacing Threshold Amplitude: 0.75 V
Lead Channel Pacing Threshold Pulse Width: 0.4 ms
Lead Channel Pacing Threshold Pulse Width: 0.4 ms
Lead Channel Sensing Intrinsic Amplitude: 1.75 mV
Lead Channel Sensing Intrinsic Amplitude: 1.75 mV
Lead Channel Sensing Intrinsic Amplitude: 15.25 mV
Lead Channel Sensing Intrinsic Amplitude: 15.25 mV
Lead Channel Setting Pacing Amplitude: 2 V
Lead Channel Setting Pacing Amplitude: 2.5 V
Lead Channel Setting Pacing Pulse Width: 0.4 ms
Lead Channel Setting Sensing Sensitivity: 0.9 mV
Zone Setting Status: 755011

## 2024-03-21 ENCOUNTER — Ambulatory Visit
Admission: RE | Admit: 2024-03-21 | Discharge: 2024-03-21 | Disposition: A | Discharge status: Home / Self Care | Source: Ambulatory Visit | Attending: Internal Medicine | Admitting: Internal Medicine

## 2024-03-21 ENCOUNTER — Other Ambulatory Visit: Payer: Self-pay | Admitting: Internal Medicine

## 2024-03-21 DIAGNOSIS — R928 Other abnormal and inconclusive findings on diagnostic imaging of breast: Secondary | ICD-10-CM

## 2024-03-21 DIAGNOSIS — N6320 Unspecified lump in the left breast, unspecified quadrant: Secondary | ICD-10-CM

## 2024-03-21 DIAGNOSIS — N6322 Unspecified lump in the left breast, upper inner quadrant: Secondary | ICD-10-CM | POA: Diagnosis not present

## 2024-04-10 DIAGNOSIS — R7989 Other specified abnormal findings of blood chemistry: Secondary | ICD-10-CM | POA: Diagnosis not present

## 2024-04-10 DIAGNOSIS — N1832 Chronic kidney disease, stage 3b: Secondary | ICD-10-CM | POA: Diagnosis not present

## 2024-04-10 DIAGNOSIS — I10 Essential (primary) hypertension: Secondary | ICD-10-CM | POA: Diagnosis not present

## 2024-04-10 DIAGNOSIS — Z299 Encounter for prophylactic measures, unspecified: Secondary | ICD-10-CM | POA: Diagnosis not present

## 2024-04-10 DIAGNOSIS — M1990 Unspecified osteoarthritis, unspecified site: Secondary | ICD-10-CM | POA: Diagnosis not present

## 2024-04-14 DIAGNOSIS — M818 Other osteoporosis without current pathological fracture: Secondary | ICD-10-CM | POA: Diagnosis not present

## 2024-04-14 DIAGNOSIS — I10 Essential (primary) hypertension: Secondary | ICD-10-CM | POA: Diagnosis not present

## 2024-05-06 NOTE — Progress Notes (Signed)
 Remote pacemaker transmission.

## 2024-05-13 DIAGNOSIS — N1832 Chronic kidney disease, stage 3b: Secondary | ICD-10-CM | POA: Diagnosis not present

## 2024-05-15 DIAGNOSIS — I10 Essential (primary) hypertension: Secondary | ICD-10-CM | POA: Diagnosis not present

## 2024-05-19 ENCOUNTER — Telehealth: Payer: Self-pay

## 2024-05-19 NOTE — Telephone Encounter (Signed)
 ATC x1 for precharting

## 2024-05-20 ENCOUNTER — Encounter: Payer: Self-pay | Admitting: Primary Care

## 2024-05-20 ENCOUNTER — Ambulatory Visit (INDEPENDENT_AMBULATORY_CARE_PROVIDER_SITE_OTHER): Admitting: Primary Care

## 2024-05-20 VITALS — BP 154/78 | HR 77 | Temp 97.7°F | Ht 63.0 in | Wt 149.8 lb

## 2024-05-20 DIAGNOSIS — G4733 Obstructive sleep apnea (adult) (pediatric): Secondary | ICD-10-CM

## 2024-05-20 NOTE — Progress Notes (Signed)
 @Patient  ID: Susan Hicks, female    DOB: [birth year], 78 y.o.   MRN: [id number]  Chief Complaint  Patient presents with   Sleep Apnea    Needs to estb. care with a pulm/sleep dr, on CPAP by pcp    Referring provider: Mealor, Eulas BRAVO, MD  HPI: 78 year old female. PMH significant for HTN, second degree AV block, OSA on CPAP, GERD, fibromyalgia, GAD, elevated liver enzymes, prolonged QTC interval.   05/20/2024  History of Present Illness  Discussed the use of AI scribe software for clinical note transcription with the patient, who gave verbal consent to proceed.  KEITRA CARUSONE is a 78 year old female with sleep apnea who presents for establishment of care.  She has a history of sleep apnea and consistently uses a CPAP machine every night, averaging eight hours and fourteen minutes of use per night. Her current CPAP machine is approximately four years old, with pressure settings on auto set, ranging from a minimum four to a maximum of twenty. Her CPAP machine reports an average of 3.5 apnea events per hour. Occasionally, she wakes up gasping if she disconnects the CPAP during the night, affecting her well-being the following day. She goes to bed between 10 and 11:30 PM, takes 30 minutes to two hours to fall asleep, and wakes up once or twice a night, starting her day at 9 or 9:30 AM.  She has a history of hypertension, heart block, and has a pacemaker. She is also followed by a rheumatologist for connective tissue disease. Previously, she experienced significant side effects from Arava, including hair loss, eyebrow loss, and a 20-pound weight loss, which resolved after discontinuing the medication six months ago. Her weight now fluctuates by 10 to 15 pounds, and she continues to experience generalized aches.  She uses [employer] as her medical supply store, which mails her CPAP supplies.     Allergies  Allergen Reactions   Lisinopril Cough   Beta Adrenergic Blockers  Other (See Comments)    Bradycardia - bottomed out heart rate    Gluten Meal Diarrhea    cramps   Penicillins Hives   Norvasc [Amlodipine] Other (See Comments)    HEADACHE     Immunization History  Administered Date(s) Administered   Tdap 10/03/2017    Past Medical History:  Diagnosis Date   Abnormal breast biopsy    Depression    Fibromyalgia    now considered mixed connective tissue disorder   First degree heart block by electrocardiogram    Generalized anxiety disorder 08/21/2018   GERD (gastroesophageal reflux disease)    History of cholecystectomy    History of right knee replacement    Hypertension    Irritable bowel    Junctional bradycardia    a. 2017 - beta blocker discontinued.   LBBB (left bundle branch block)    lumbar disc replacement    Mixed connective tissue disease (HCC)    Obstructive sleep apnea treated with continuous positive airway pressure (CPAP) 08/21/2018   Osteoporosis    Premature atrial contractions    Primary localized osteoarthritis of right knee     Tobacco History: Social History   Tobacco Use  Smoking Status Never  Smokeless Tobacco Never   Counseling given: Not Answered   Outpatient Medications Prior to Visit  Medication Sig Dispense Refill   albuterol (VENTOLIN HFA) 108 (90 Base) MCG/ACT inhaler Inhale 1-2 puffs into the lungs as needed for wheezing or shortness of breath.  colestipol  (COLESTID ) 1 g tablet Take 1 tablet (1 g total) by mouth 2 (two) times daily. 60 tablet 5   metoprolol  succinate (TOPROL -XL) 50 MG 24 hr tablet TAKE 1 TABLET BY MOUTH DAILY  WITH OR IMMEDIATELY FOLLOWING A  MEAL 100 tablet 2   Artificial Tear Solution (SOOTHE XP OP) Place 1 drop into both eyes in the morning and at bedtime.     b complex vitamins tablet Take 1 tablet by mouth daily.     busPIRone  (BUSPAR ) 15 MG tablet Take 15 mg by mouth 2 (two) times daily.     Candesartan Cilexetil-HCTZ 32-25 MG TABS Take 1 tablet by mouth daily.      cetirizine (ZYRTEC) 10 MG tablet Take 10 mg by mouth daily.     chlorpheniramine (CHLOR-TRIMETON) 4 MG tablet Take 4 mg by mouth at bedtime as needed for allergies.     Cholecalciferol  (VITAMIN D3) 2000 units TABS Take 2,000 Units by mouth daily.      cycloSPORINE (RESTASIS) 0.05 % ophthalmic emulsion 1 drop 2 (two) times daily.     diphenhydramine -acetaminophen  (TYLENOL  PM) 25-500 MG TABS tablet Take 1 tablet by mouth at bedtime.      escitalopram (LEXAPRO) 20 MG tablet Take 20 mg by mouth daily.     famotidine (PEPCID) 20 MG tablet Take 20 mg by mouth at bedtime.     hydrALAZINE  (APRESOLINE ) 25 MG tablet Take 25 mg by mouth 3 (three) times daily.     LORazepam  (ATIVAN ) 0.5 MG tablet Take 0.5 mg by mouth at bedtime.     Magnesium  400 MG CAPS Take 400 mg by mouth at bedtime.     melatonin 5 MG TABS Take 5 mg by mouth at bedtime.     meloxicam (MOBIC) 7.5 MG tablet Take 7.5 mg by mouth in the morning and at bedtime.     Misc Natural Products (BEET ROOT) 500 MG CAPS Take 1 capsule by mouth daily.     Multiple Vitamins-Minerals (ADULT GUMMY) CHEW Chew 2 tablets by mouth daily.     Multiple Vitamins-Minerals (PRESERVISION AREDS PO) Take 1 tablet by mouth daily.     oxybutynin  (DITROPAN ) 5 MG tablet Take 5 mg by mouth daily.     pantoprazole  (PROTONIX ) 40 MG tablet Take 40 mg by mouth daily.     PRESCRIPTION MEDICATION Inhale into the lungs at bedtime. CPAP     rosuvastatin (CRESTOR) 5 MG tablet Take 5 mg by mouth once a week.     traMADol  (ULTRAM ) 50 MG tablet Take 50 mg by mouth every 6 (six) hours as needed for moderate pain (hip / back pain).     Turmeric Curcumin 500 MG CAPS Take 500 mg by mouth at bedtime.     No facility-administered medications prior to visit.   Review of Systems  Review of Systems  Constitutional: Negative.   HENT: Negative.    Respiratory: Negative.    Cardiovascular: Negative.     Physical Exam  BP (!) 154/78   Pulse 77   Temp 97.7 F (36.5 C)   Ht 5' 3  (1.6 m)   Wt 149 lb 12.8 oz (67.9 kg)   SpO2 97% Comment: ra  BMI 26.54 kg/m  Physical Exam Constitutional:      Appearance: Normal appearance.  HENT:     Head: Normocephalic and atraumatic.  Cardiovascular:     Rate and Rhythm: Normal rate and regular rhythm.  Pulmonary:     Effort: Pulmonary effort is normal.  Breath sounds: Normal breath sounds.  Skin:    General: Skin is warm and dry.  Neurological:     General: No focal deficit present.     Mental Status: She is alert and oriented to person, place, and time. Mental status is at baseline.  Psychiatric:        Mood and Affect: Mood normal.        Behavior: Behavior normal.        Thought Content: Thought content normal.        Judgment: Judgment normal.     Lab Results:  CBC    Component Value Date/Time   WBC 5.1 01/15/2024 1242   RBC 4.27 01/15/2024 1242   HGB 12.4 01/15/2024 1242   HGB 11.1 06/22/2020 1108   HCT 36.9 01/15/2024 1242   HCT 33.4 (L) 06/22/2020 1108   PLT 193 01/15/2024 1242   PLT 241 06/22/2020 1108   MCV 86.4 01/15/2024 1242   MCV 83 06/22/2020 1108   MCH 29.0 01/15/2024 1242   MCHC 33.6 01/15/2024 1242   RDW 12.6 01/15/2024 1242   RDW 12.7 06/22/2020 1108   LYMPHSABS 1.1 06/22/2020 1108   MONOABS 0.6 08/29/2018 1426   EOSABS 0.2 06/22/2020 1108   BASOSABS 0.0 06/22/2020 1108    BMET    Component Value Date/Time   NA 134 (L) 01/15/2024 1227   NA 135 06/22/2020 1108   K 4.0 01/15/2024 1227   CL 101 01/15/2024 1227   CO2 20 (L) 01/15/2024 1227   GLUCOSE 96 01/15/2024 1227   BUN 32 (H) 01/15/2024 1227   BUN 18 06/22/2020 1108   CREATININE 1.41 (H) 01/15/2024 1227   CALCIUM 9.3 01/15/2024 1227   GFRNONAA 38 (L) 01/15/2024 1227   GFRAA 45 (L) 07/18/2020 1918    BNP No results found for: BNP  ProBNP No results found for: PROBNP  Imaging: No results found.   Assessment & Plan:   1. OSA (obstructive sleep apnea) (Primary) - Ambulatory Referral for DME  Assessment  and Plan    Obstructive sleep apnea Obstructive sleep apnea is well controlled with current CPAP settings. She uses the CPAP machine consistently every night, averaging eight hours and fourteen minutes per night. The apnea-hypopnea index (AHI) is 3.5 events per hour, indicating well-controlled sleep apnea. The CPAP machine is set to auto mode with a pressure range of 4 to 20 cm H2O, effectively managing the condition. She reports occasional disconnection of the CPAP during nighttime bathroom visits, leading to waking up gasping, but overall compliance is excellent. - No changed recommended to current settings - Renew CPAP supplies as needed. - Follow up in one year to reassess CPAP needs and settings.      Almarie LELON Ferrari, NP 05/20/2024

## 2024-05-20 NOTE — Patient Instructions (Addendum)
   VISIT SUMMARY: You came in today to establish care and discuss your sleep apnea. You have been using your CPAP machine consistently every night, which is helping to control your sleep apnea. We also reviewed your history of hypertension, heart block, and connective tissue disease.  YOUR PLAN: -OBSTRUCTIVE SLEEP APNEA: Obstructive sleep apnea is a condition where your airway becomes blocked during sleep, causing breathing pauses. Your sleep apnea is well controlled with your current CPAP settings, and you are using the machine consistently every night. Your apnea-hypopnea index (AHI) is 3.5 events per hour, which indicates good control. Continue using your CPAP machine with the current settings, and renew your CPAP supplies as needed. We will follow up in one year to reassess your CPAP needs and settings.  Orders: Renew CPAP supplies  Follow-up: 1 year follow-up sleep provider

## 2024-05-21 DIAGNOSIS — M6530 Trigger finger, unspecified finger: Secondary | ICD-10-CM | POA: Diagnosis not present

## 2024-05-21 DIAGNOSIS — R682 Dry mouth, unspecified: Secondary | ICD-10-CM | POA: Diagnosis not present

## 2024-05-21 DIAGNOSIS — I7300 Raynaud's syndrome without gangrene: Secondary | ICD-10-CM | POA: Diagnosis not present

## 2024-05-21 DIAGNOSIS — M797 Fibromyalgia: Secondary | ICD-10-CM | POA: Diagnosis not present

## 2024-05-21 DIAGNOSIS — M351 Other overlap syndromes: Secondary | ICD-10-CM | POA: Diagnosis not present

## 2024-05-21 DIAGNOSIS — N1831 Chronic kidney disease, stage 3a: Secondary | ICD-10-CM | POA: Diagnosis not present

## 2024-06-14 DIAGNOSIS — I10 Essential (primary) hypertension: Secondary | ICD-10-CM | POA: Diagnosis not present

## 2024-06-19 ENCOUNTER — Ambulatory Visit: Payer: Medicare Other

## 2024-06-19 DIAGNOSIS — I441 Atrioventricular block, second degree: Secondary | ICD-10-CM

## 2024-06-20 LAB — CUP PACEART REMOTE DEVICE CHECK
Battery Remaining Longevity: 99 mo
Battery Voltage: 2.99 V
Brady Statistic AP VP Percent: 97.3 %
Brady Statistic AP VS Percent: 0.35 %
Brady Statistic AS VP Percent: 0.61 %
Brady Statistic AS VS Percent: 1.74 %
Brady Statistic RA Percent Paced: 97.6 %
Brady Statistic RV Percent Paced: 97.91 %
Date Time Interrogation Session: 20250904054200
Implantable Lead Connection Status: 753985
Implantable Lead Connection Status: 753985
Implantable Lead Implant Date: 20210909
Implantable Lead Implant Date: 20210909
Implantable Lead Location: 753859
Implantable Lead Location: 753860
Implantable Lead Model: 3830
Implantable Lead Model: 5076
Implantable Pulse Generator Implant Date: 20210909
Lead Channel Impedance Value: 361 Ohm
Lead Channel Impedance Value: 380 Ohm
Lead Channel Impedance Value: 418 Ohm
Lead Channel Impedance Value: 475 Ohm
Lead Channel Pacing Threshold Amplitude: 0.625 V
Lead Channel Pacing Threshold Amplitude: 0.625 V
Lead Channel Pacing Threshold Pulse Width: 0.4 ms
Lead Channel Pacing Threshold Pulse Width: 0.4 ms
Lead Channel Sensing Intrinsic Amplitude: 1.125 mV
Lead Channel Sensing Intrinsic Amplitude: 1.125 mV
Lead Channel Sensing Intrinsic Amplitude: 10.75 mV
Lead Channel Sensing Intrinsic Amplitude: 10.75 mV
Lead Channel Setting Pacing Amplitude: 2 V
Lead Channel Setting Pacing Amplitude: 2.5 V
Lead Channel Setting Pacing Pulse Width: 0.4 ms
Lead Channel Setting Sensing Sensitivity: 0.9 mV
Zone Setting Status: 755011

## 2024-06-28 NOTE — Progress Notes (Signed)
 Remote PPM Transmission

## 2024-07-02 ENCOUNTER — Ambulatory Visit: Payer: Self-pay | Admitting: Cardiovascular Disease

## 2024-07-02 DIAGNOSIS — I10 Essential (primary) hypertension: Secondary | ICD-10-CM | POA: Diagnosis not present

## 2024-07-02 DIAGNOSIS — Z299 Encounter for prophylactic measures, unspecified: Secondary | ICD-10-CM | POA: Diagnosis not present

## 2024-07-02 DIAGNOSIS — L821 Other seborrheic keratosis: Secondary | ICD-10-CM | POA: Diagnosis not present

## 2024-07-15 DIAGNOSIS — I10 Essential (primary) hypertension: Secondary | ICD-10-CM | POA: Diagnosis not present

## 2024-07-30 ENCOUNTER — Encounter (INDEPENDENT_AMBULATORY_CARE_PROVIDER_SITE_OTHER): Payer: Self-pay | Admitting: Gastroenterology

## 2024-08-01 DIAGNOSIS — N1832 Chronic kidney disease, stage 3b: Secondary | ICD-10-CM | POA: Diagnosis not present

## 2024-08-01 DIAGNOSIS — G4700 Insomnia, unspecified: Secondary | ICD-10-CM | POA: Diagnosis not present

## 2024-08-01 DIAGNOSIS — E7800 Pure hypercholesterolemia, unspecified: Secondary | ICD-10-CM | POA: Diagnosis not present

## 2024-08-01 DIAGNOSIS — Z299 Encounter for prophylactic measures, unspecified: Secondary | ICD-10-CM | POA: Diagnosis not present

## 2024-08-01 DIAGNOSIS — I10 Essential (primary) hypertension: Secondary | ICD-10-CM | POA: Diagnosis not present

## 2024-08-05 ENCOUNTER — Ambulatory Visit: Admitting: Cardiology

## 2024-08-11 ENCOUNTER — Ambulatory Visit (INDEPENDENT_AMBULATORY_CARE_PROVIDER_SITE_OTHER): Admitting: Gastroenterology

## 2024-08-11 ENCOUNTER — Encounter (INDEPENDENT_AMBULATORY_CARE_PROVIDER_SITE_OTHER): Payer: Self-pay | Admitting: Gastroenterology

## 2024-08-11 VITALS — BP 137/80 | HR 65 | Temp 98.0°F | Ht 63.0 in | Wt 150.2 lb

## 2024-08-11 DIAGNOSIS — R748 Abnormal levels of other serum enzymes: Secondary | ICD-10-CM | POA: Diagnosis not present

## 2024-08-11 DIAGNOSIS — Z8379 Family history of other diseases of the digestive system: Secondary | ICD-10-CM

## 2024-08-11 DIAGNOSIS — K9089 Other intestinal malabsorption: Secondary | ICD-10-CM | POA: Insufficient documentation

## 2024-08-11 DIAGNOSIS — K529 Noninfective gastroenteritis and colitis, unspecified: Secondary | ICD-10-CM

## 2024-08-11 MED ORDER — COLESTIPOL HCL 1 G PO TABS: 1.0000 g | ORAL_TABLET | Freq: Two times a day (BID) | ORAL | 5 refills | Status: AC

## 2024-08-11 NOTE — Patient Instructions (Signed)
 It was very nice to meet you today, as dicussed with will plan for the following :  1) labs

## 2024-08-11 NOTE — Progress Notes (Signed)
 Susan Hicks , M.D. Gastroenterology & Hepatology [facility] [address] [place], [place] [address] Primary Care Physician: Rosamond Leta NOVAK, MD [address] [place] [address]  Chief Complaint: Chronic diarrhea and elevated liver enzymes  History of Present Illness:  Susan Hicks is a 78 y.o. female with symptomatic bradycardia, s/p PPM in 2021, OSA on CPAP, hypertension, hyperlipidemia, and CKD stage III , IBS, mixed connective tissue disorder who presents for evaluation of chronic diarrhea in setting of family history of celiac disease and elevated liver enzymes.  Today patient reports her diarrhea 90% better with gluten-free diet and being on bile acid sequestrant.  Patient previously reports chronic diarrhea since age 67.  She reported that she had 3-4 liquid bowel movements while taking Imodium  daily.  Patient reports family history of celiac disease and granddaughter and other family members who appears to be on gluten-free diet although without any formal evaluation.  Patient herself is on gluten-free diet and would notice if she eats gluten her symptoms of gotten worse.  Denies any hard stools or straining.  Regarding elevated liver enzymes patient does not take any herbal medications or use alcohol .  The only new medication she was started on was leflunomide which was started 2 years back rather  Patient reports since starting colestipol  diarrhea has significantly improved and she has been on gluten-free diet  The patient denies having any nausea, vomiting, fever, chills, hematochezia, melena, hematemesis, jaundice, pruritus or weight loss.  Last EGD:01/2024  Normal esophagus. - Erythematous mucosa in the antrum. Biopsied. - Multiple gastric polyps typical of fundic gland polyp  - Normal duodenal bulb and second portion of the duodenum. Biopsied x 8   Path :  A. SMALL BOWEL, BIOPSY:  -  Duodenal mucosa with no  significant pathology.    B. STOMACH, BIOPSY:  Antral/oxyntic mucosa with mild chemical/reactive change.  -  No Helicobacter pylori organisms identified on HE stained slide.    C. COLON, RANDOM, BIOPSY:  -  Colonic mucosa with no significant pathology.    D. TERMINAL ILEUM, BIOPSY:  Small intestinal mucosa with no significant pathology.     Last Colonoscopy:01/2024  - Diverticulosis in the left colon. - Congested mucosa in the terminal ileum. Biopsied. - Non- bleeding internal hemorrhoids.   FHx: Granddaughter with celiac disease, many other family members on gluten-free diet but never had formal evaluation for celiac disease Social: neg smoking, alcohol  or illicit drug use Surgical: no abdominal surgeries  Last labs from 11/2023 AST 277 ALT 323 alk phos 121 Liver enzymes were normal in 2024 Past Medical History: Past Medical History:  Diagnosis Date   Abnormal breast biopsy    Depression    Fibromyalgia    now considered mixed connective tissue disorder   First degree heart block by electrocardiogram    Generalized anxiety disorder 08/21/2018   GERD (gastroesophageal reflux disease)    History of cholecystectomy    History of right knee replacement    Hypertension    Irritable bowel    Junctional bradycardia    a. 2017 - beta blocker discontinued.   LBBB (left bundle branch block)    lumbar disc replacement    Mixed connective tissue disease    Obstructive sleep apnea treated with continuous positive airway pressure (CPAP) 08/21/2018   Osteoporosis    Premature atrial contractions    Primary localized osteoarthritis of right knee     Past Surgical History: Past Surgical History:  Procedure  Laterality Date   BACK SURGERY     BREAST CYST EXCISION     CHOLECYSTECTOMY     COLONOSCOPY N/A 01/17/2024   Procedure: COLONOSCOPY;  Surgeon: Cinderella Deatrice FALCON, MD;  Location: [facility];  Service: Endoscopy;  Laterality: N/A;  9:30AM;ASA 1-2   ESOPHAGOGASTRODUODENOSCOPY  N/A 01/17/2024   Procedure: EGD (ESOPHAGOGASTRODUODENOSCOPY);  Surgeon: Cinderella Deatrice FALCON, MD;  Location: [facility];  Service: Endoscopy;  Laterality: N/A;  9:30AM;ASA 1-2   HAND SURGERY  2006   PACEMAKER IMPLANT N/A 06/24/2020   Procedure: PACEMAKER IMPLANT;  Surgeon: Kelsie Agent, MD;  Location: [facility];  Service: Cardiovascular;  Laterality: N/A;   TOTAL KNEE ARTHROPLASTY Right 09/02/2018   Procedure: TOTAL KNEE ARTHROPLASTY;  Surgeon: Jane Charleston, MD;  Location: [facility] OR;  Service: Orthopedics;  Laterality: Right;    Family History: Family History  Problem Relation Age of Onset   Thyroid  disease Mother    Hypertension Mother    Heart attack Father    Pulmonary disease Father    Hypertension Father    Thyroid  disease Sister    Hypertension Sister    Hypertension Daughter    Hypertension Son    Breast cancer Neg Hx     Social History: Social History   Tobacco Use  Smoking Status Never  Smokeless Tobacco Never   Social History   Substance and Sexual Activity  Alcohol  Use No   Alcohol /week: 0.0 standard drinks of alcohol    Social History   Substance and Sexual Activity  Drug Use No    Allergies: Allergies  Allergen Reactions   Lisinopril Cough   Beta Adrenergic Blockers Other (See Comments)    Bradycardia - bottomed out heart rate    Gluten Meal Diarrhea    cramps   Penicillins Hives   Norvasc [Amlodipine] Other (See Comments)    HEADACHE     Medications: Current Outpatient Medications  Medication Sig Dispense Refill   acetaminophen  (TYLENOL ) 650 MG CR tablet Take 1,300 mg by mouth daily at 6 (six) AM.     albuterol (VENTOLIN HFA) 108 (90 Base) MCG/ACT inhaler Inhale 1-2 puffs into the lungs as needed for wheezing or shortness of breath.     Artificial Tear Solution (SOOTHE XP OP) Place 1 drop into both eyes in the morning and at bedtime.     b complex vitamins tablet Take 1 tablet by mouth daily.     busPIRone  (BUSPAR ) 15 MG tablet Take 15  mg by mouth 2 (two) times daily.     Candesartan Cilexetil-HCTZ 32-25 MG TABS Take 1 tablet by mouth daily.     cetirizine (ZYRTEC) 10 MG tablet Take 10 mg by mouth daily.     chlorpheniramine (CHLOR-TRIMETON) 4 MG tablet Take 4 mg by mouth at bedtime as needed for allergies.     Cholecalciferol  (VITAMIN D3) 2000 units TABS Take 2,000 Units by mouth daily.      colestipol  (COLESTID ) 1 g tablet Take 1 tablet (1 g total) by mouth 2 (two) times daily. 60 tablet 5   cycloSPORINE (RESTASIS) 0.05 % ophthalmic emulsion 1 drop 2 (two) times daily.     diphenhydramine -acetaminophen  (TYLENOL  PM) 25-500 MG TABS tablet Take 1 tablet by mouth at bedtime.      escitalopram (LEXAPRO) 20 MG tablet Take 20 mg by mouth daily.     famotidine (PEPCID) 20 MG tablet Take 20 mg by mouth at bedtime.     hydrALAZINE  (APRESOLINE ) 25 MG tablet Take 25 mg by  mouth 3 (three) times daily.     LORazepam  (ATIVAN ) 0.5 MG tablet Take 0.5 mg by mouth at bedtime.     Magnesium  400 MG CAPS Take 400 mg by mouth at bedtime.     melatonin 5 MG TABS Take 5 mg by mouth at bedtime.     metoprolol  succinate (TOPROL -XL) 50 MG 24 hr tablet TAKE 1 TABLET BY MOUTH DAILY  WITH OR IMMEDIATELY FOLLOWING A  MEAL 100 tablet 2   Misc Natural Products (BEET ROOT) 500 MG CAPS Take 1 capsule by mouth daily.     Multiple Vitamins-Minerals (ADULT GUMMY) CHEW Chew 2 tablets by mouth daily.     Multiple Vitamins-Minerals (PRESERVISION AREDS PO) Take 1 tablet by mouth daily.     oxybutynin  (DITROPAN ) 5 MG tablet Take 5 mg by mouth daily.     pantoprazole  (PROTONIX ) 40 MG tablet Take 40 mg by mouth daily.     PRESCRIPTION MEDICATION Inhale into the lungs at bedtime. CPAP     rosuvastatin (CRESTOR) 5 MG tablet Take 5 mg by mouth once a week.     traMADol  (ULTRAM ) 50 MG tablet Take 50 mg by mouth every 6 (six) hours as needed for moderate pain (hip / back pain).     Turmeric Curcumin 500 MG CAPS Take 500 mg by mouth at bedtime.     No current  facility-administered medications for this visit.    Review of Systems: GENERAL: negative for malaise, night sweats HEENT: No changes in hearing or vision, no nose bleeds or other nasal problems. NECK: Negative for lumps, goiter, pain and significant neck swelling RESPIRATORY: Negative for cough, wheezing CARDIOVASCULAR: Negative for chest pain, leg swelling, palpitations, orthopnea GI: SEE HPI MUSCULOSKELETAL: Negative for joint pain or swelling, back pain, and muscle pain. SKIN: Negative for lesions, rash HEMATOLOGY Negative for prolonged bleeding, bruising easily, and swollen nodes. ENDOCRINE: Negative for cold or heat intolerance, polyuria, polydipsia and goiter. NEURO: negative for tremor, gait imbalance, syncope and seizures. The remainder of the review of systems is noncontributory.   Physical Exam: BP 137/80 (BP Location: Right Arm, Patient Position: Sitting, Cuff Size: Normal)   Pulse 65   Temp 98 F (36.7 C) (Temporal)   Ht 5' 3 (1.6 m)   Wt 150 lb 3.2 oz (68.1 kg)   BMI 26.61 kg/m  GENERAL: The patient is AO x3, in no acute distress. HEENT: Head is normocephalic and atraumatic. EOMI are intact. Mouth is well hydrated and without lesions. NECK: Supple. No masses LUNGS: Clear to auscultation. No presence of rhonchi/wheezing/rales. Adequate chest expansion HEART: RRR, normal s1 and s2. ABDOMEN: Soft, nontender, no guarding, no peritoneal signs, and nondistended. BS +. No masses.  Imaging/Labs: as above     Latest Ref Rng & Units 01/15/2024   12:42 PM 07/18/2020    7:18 PM 06/22/2020   11:08 AM  CBC  WBC 4.0 - 10.5 K/uL 5.1  5.0  3.4   Hemoglobin 12.0 - 15.0 g/dL 87.5  88.8  88.8   Hematocrit 36.0 - 46.0 % 36.9  35.3  33.4   Platelets 150 - 400 K/uL 193  261  241    Lab Results  Component Value Date   IRON 123 01/15/2024   TIBC 433 01/15/2024   FERRITIN 29 01/15/2024    I personally reviewed and interpreted the available labs, imaging and endoscopic  files.  Workup for diarrhea  Abdominal xray without evidence of constipation C Diff negative Fecal Calpro: 10  H.Pylori stool Ag  negative GI PCR negative DQ8 POSITIVE  Celiac Panel negative   Workup for liver enzymes   AST 51 ALT 41 alk phos 54 (liver enzymes have trended down) ASMA weakly positive: 21 ANA positive Negative AMA TSH 1.3 Hep A nonimmune Hep B nonimmune nonexposed HIV negative Hepatitis C negative INR 1.0 CRP 0.7 Ferritin 29 Hemoglobin 12.4 C. difficile negative Ultrasound with fatty liver disease   Impression and Plan: Susan Hicks is a 78 y.o. female with symptomatic bradycardia, s/p PPM in 2021, OSA on CPAP, hypertension, hyperlipidemia, and CKD stage III , IBS, mixed connective tissue disorder who presents for evaluation of chronic diarrhea in setting of family history of celiac disease and elevated liver enzymes.  #Chronic diarrhea/Positive DQ8/family history of Celiac disease  We had performed upper endoscopy with small bowel biopsy celiac protocol which was negative for evidence of villous blunting ( negative celiac disease) .  But today patient reports that she really did not do full gluten challenge as directed and had an upper endoscopy with small bowel biopsy may have been false negative  Being on gluten-free diet celiac panel can also be false negative  With lab work DQ8 being positive and worsening diarrhea with gluten exposure suspicion for celiac disease still remains high  Ideally to make diagnosis patient is to be on 6 to 8 weeks of gluten diet followed up with biopsies of small bowel which patient failed to do as above  Can continue gluten-free diet DEXA q 2 years  Recommend Family screening for FDR - Continue bile acid sequestrant as it has significantly improved patient symptoms   #Elevated liver enzymes Hepatocellular pattern liver injury- trended down   Last labs from 11/2023 AST 277 ALT 323 alk phos 121 but on repeat  trended down--> AST 51 ALT: 41 ALP: 54 ( see above)   Will obtain total IgG and trend liver enzymes , if continues to be elevated will obtain liver biopsy in future for evaluation of AIH given positive ANA and weakly positive ASMA    All questions were answered.      Susan Cortese Faizan Susan Breese, MD Gastroenterology and Hepatology [facility]   This chart has been completed using [facility] Dictation software, and while attempts have been made to ensure accuracy , certain words and phrases may not be transcribed as intended

## 2024-08-12 LAB — COMPREHENSIVE METABOLIC PANEL WITH GFR
ALT: 17 IU/L (ref 0–32)
AST: 30 IU/L (ref 0–40)
Albumin: 4.6 g/dL (ref 3.8–4.8)
Alkaline Phosphatase: 71 IU/L (ref 49–135)
BUN/Creatinine Ratio: 16 (ref 12–28)
BUN: 22 mg/dL (ref 8–27)
Bilirubin Total: 0.4 mg/dL (ref 0.0–1.2)
CO2: 24 mmol/L (ref 20–29)
Calcium: 9.8 mg/dL (ref 8.7–10.3)
Chloride: 95 mmol/L — ABNORMAL LOW (ref 96–106)
Creatinine, Ser: 1.41 mg/dL — ABNORMAL HIGH (ref 0.57–1.00)
Globulin, Total: 2.5 g/dL (ref 1.5–4.5)
Glucose: 89 mg/dL (ref 70–99)
Potassium: 4.9 mmol/L (ref 3.5–5.2)
Sodium: 134 mmol/L (ref 134–144)
Total Protein: 7.1 g/dL (ref 6.0–8.5)
eGFR: 38 mL/min/1.73 — ABNORMAL LOW (ref >59)

## 2024-08-12 LAB — IGG: IgG (Immunoglobin G), Serum: 1038 mg/dL (ref 586–1602)

## 2024-08-15 ENCOUNTER — Ambulatory Visit (INDEPENDENT_AMBULATORY_CARE_PROVIDER_SITE_OTHER): Payer: Self-pay | Admitting: Gastroenterology

## 2024-08-15 NOTE — Progress Notes (Signed)
 Hi Tanya ,  Can you please call the patient and tell the patient the lab work  shows normalized liver enzymes , this is a good news   Thanks,  Braleigh Massoud Faizan Tenaya Hilyer, MD Gastroenterology and Hepatology [facility]  Labs  ALP:71 AST: 30  ALT:17 Normal IgG levels

## 2024-08-20 ENCOUNTER — Encounter (INDEPENDENT_AMBULATORY_CARE_PROVIDER_SITE_OTHER): Payer: Self-pay

## 2024-09-18 ENCOUNTER — Ambulatory Visit: Payer: Self-pay | Admitting: Cardiovascular Disease

## 2024-09-18 ENCOUNTER — Ambulatory Visit: Payer: Medicare Other

## 2024-09-18 DIAGNOSIS — I441 Atrioventricular block, second degree: Secondary | ICD-10-CM | POA: Diagnosis not present

## 2024-09-18 LAB — CUP PACEART REMOTE DEVICE CHECK
Battery Remaining Longevity: 93 mo
Battery Voltage: 2.99 V
Brady Statistic AP VP Percent: 83.17 %
Brady Statistic AP VS Percent: 2.77 %
Brady Statistic AS VP Percent: 0.56 %
Brady Statistic AS VS Percent: 13.5 %
Brady Statistic RA Percent Paced: 85.86 %
Brady Statistic RV Percent Paced: 83.73 %
Date Time Interrogation Session: 20251204011802
Implantable Lead Connection Status: 753985
Implantable Lead Connection Status: 753985
Implantable Lead Implant Date: 20210909
Implantable Lead Implant Date: 20210909
Implantable Lead Location: 753859
Implantable Lead Location: 753860
Implantable Lead Model: 3830
Implantable Lead Model: 5076
Implantable Pulse Generator Implant Date: 20210909
Lead Channel Impedance Value: 342 Ohm
Lead Channel Impedance Value: 380 Ohm
Lead Channel Impedance Value: 399 Ohm
Lead Channel Impedance Value: 456 Ohm
Lead Channel Pacing Threshold Amplitude: 0.625 V
Lead Channel Pacing Threshold Amplitude: 0.625 V
Lead Channel Pacing Threshold Pulse Width: 0.4 ms
Lead Channel Pacing Threshold Pulse Width: 0.4 ms
Lead Channel Sensing Intrinsic Amplitude: 10.5 mV
Lead Channel Sensing Intrinsic Amplitude: 10.5 mV
Lead Channel Sensing Intrinsic Amplitude: 2.125 mV
Lead Channel Sensing Intrinsic Amplitude: 2.125 mV
Lead Channel Setting Pacing Amplitude: 2 V
Lead Channel Setting Pacing Amplitude: 2.5 V
Lead Channel Setting Pacing Pulse Width: 0.4 ms
Lead Channel Setting Sensing Sensitivity: 0.9 mV
Zone Setting Status: 755011

## 2024-09-19 NOTE — Progress Notes (Signed)
 Remote PPM Transmission

## 2024-09-22 ENCOUNTER — Encounter

## 2024-09-22 ENCOUNTER — Other Ambulatory Visit

## 2024-10-07 ENCOUNTER — Ambulatory Visit: Admitting: Cardiology

## 2024-10-10 ENCOUNTER — Ambulatory Visit: Admitting: Cardiology

## 2024-10-22 ENCOUNTER — Other Ambulatory Visit

## 2024-10-22 ENCOUNTER — Encounter

## 2024-11-18 ENCOUNTER — Encounter: Payer: Self-pay | Admitting: Nurse Practitioner

## 2024-11-18 ENCOUNTER — Ambulatory Visit: Admitting: Nurse Practitioner

## 2024-11-18 VITALS — BP 148/86 | HR 88 | Ht 63.0 in | Wt 156.0 lb

## 2024-11-18 DIAGNOSIS — Z950 Presence of cardiac pacemaker: Secondary | ICD-10-CM | POA: Diagnosis not present

## 2024-11-18 DIAGNOSIS — G4733 Obstructive sleep apnea (adult) (pediatric): Secondary | ICD-10-CM | POA: Diagnosis not present

## 2024-11-18 DIAGNOSIS — I10 Essential (primary) hypertension: Secondary | ICD-10-CM | POA: Diagnosis not present

## 2024-11-18 DIAGNOSIS — Z87898 Personal history of other specified conditions: Secondary | ICD-10-CM | POA: Diagnosis not present

## 2024-11-18 DIAGNOSIS — N1832 Chronic kidney disease, stage 3b: Secondary | ICD-10-CM | POA: Diagnosis not present

## 2024-11-18 NOTE — Progress Notes (Unsigned)
 " Cardiology Office Note:  .   Date:  11/18/2024 ID:  Susan Hicks, DOB 05/29/1946, MRN [id number] PCP: Rosamond Leta NOVAK, MD  [facility] Providers Cardiologist:  Alvan Carrier, MD Electrophysiologist:  [name] Furbish, MD    History of Present Illness: .   Susan Hicks is a 79 y.o. female with a PMH of symptomatic bradycardia, s/p PPM in 2021, OSA on CPAP, hypertension, hyperlipidemia, and CKD stage III , who presents today for scheduled follow-up.  I last saw patient on Mar 08, 2023.  Blood pressure was not well-controlled despite compliance with medications.  Was compliant with her CPAP for OSA.  Patient endorsed fatigue that she felt was attributed to metoprolol .  Valsartan  was increased to 320 mg daily. No RAS on imaging.  Was referred to [facility] clinic.  Today she presents for follow-up.  She states BP well controlled today after PCP made recent adjustments with her medications. Shows me BP log that shows elevated readings, she wonders if this is d/t anxiety with having to check her BP every day. Denies any chest pain, shortness of breath, palpitations, syncope, presyncope, dizziness, orthopnea, PND, swelling or significant weight changes, acute bleeding, or claudication. Does admit to wheezing at night.   ED visit on 11/05/2023 for HTN, home reading of 220/100. Admitted to [facility] and feeling swimmy headed. Took only 25 mg of her nightly Metoprolol  instead of 50 mg at night. Pt was concerned she was having a stroke. Workup was overall unremarkable. Told to f/u with [facility].   12/17/2023 - Today she presents for follow-up. Doing well. Says she sends in her BP readings to Dr. Rosamond regularly. BP readings vary at times. She is planning to see a GI specialist tomorrow. Denies any chest pain, shortness of breath, palpitations, syncope, presyncope, dizziness, orthopnea, PND, swelling or significant weight changes, acute bleeding, or  claudication.  11/18/2024 - Here for follow-up.  Doing well and denies any acute cardiac complaints or issues.  Says her BP at home is overall very well-controlled. Denies any chest pain, shortness of breath, palpitations, syncope, presyncope, dizziness, orthopnea, PND, swelling or significant weight changes, acute bleeding, or claudication.   Previous Antihypertensives:  Lisinopril (Cough) Norvasc (Headache)  Studies Reviewed: SABRA    EKG: EKG is not ordered today.      CCTA 05/2022:  IMPRESSION: 1. Minimal nonobstructive CAD, CADRADS = 1. Trivial ostial left main calcified plaque. At the mid portion, the RCA is not well visualized due to interfering artifact from pacer wires. Appears to taper due to nondominance, but cannot exclude stenosis due to artifact   2. Coronary calcium score of 19. This was 50th percentile for age and sex matched control. Due to artifact from pacer wires, unable to evaluate calcium in the RCA   3. Normal coronary origin with left dominance.   INTERPRETATION:   CAD-RADS 1: Minimal non-obstructive CAD (0-24%). Consider non-atherosclerotic causes of chest pain. Consider preventive therapy and risk factor modification.   IMPRESSION: Minimal subsegmental atelectasis in lingula.   Otherwise negative exam.   Echo 05/2020:  1. Left ventricular ejection fraction, by estimation, is 55 to 60%. The  left ventricle has normal function. The left ventricle has no regional  wall motion abnormalities. Left ventricular diastolic parameters are  consistent with Grade I diastolic  dysfunction (impaired relaxation). Elevated left ventricular end-diastolic  pressure. The average left ventricular global longitudinal strain is -22.9  %. The global longitudinal strain is normal.   2. Right  ventricular systolic function is normal. The right ventricular  size is normal. There is normal pulmonary artery systolic pressure. The  estimated right ventricular systolic pressure is  24.5 mmHg.   3. The mitral valve is abnormal. Trivial mitral valve regurgitation.   4. The aortic valve is tricuspid. Aortic valve regurgitation is not  visualized. Mild aortic valve sclerosis is present, with no evidence of  aortic valve stenosis.   5. The inferior vena cava is normal in size with greater than 50%  respiratory variability, suggesting right atrial pressure of 3 mmHg.  Physical Exam:   VS:  BP (!) 148/86 (BP Location: Left Arm)   Pulse 88   Ht 5' 3 (1.6 m)   Wt 156 lb (70.8 kg)   SpO2 97%   BMI 27.63 kg/m    Wt Readings from Last 3 Encounters:  11/18/24 156 lb (70.8 kg)  08/11/24 150 lb 3.2 oz (68.1 kg)  05/20/24 149 lb 12.8 oz (67.9 kg)    GEN: Well nourished, well developed in no acute distress NECK: No JVD; No carotid bruits CARDIAC: S1/S2, RRR, no murmurs, rubs, gallops RESPIRATORY:  Clear to auscultation without rales, wheezing or rhonchi  ABDOMEN: Soft, non-tender, non-distended EXTREMITIES:  No edema; No deformity   ASSESSMENT AND PLAN: .    HTN BP borderline elevated today. States BP is well controlled at home. Continue current medication regimen. No RAS on past imaging. Discussed to monitor BP at home after medications and sitting for 5-10 minutes. Salty six diet sheet and BP log given.  Continue to follow-up with PCP as he is managing this.  Recommended to increase hydralazine  to 50 mg 1 time a day and 25 mg twice a day, recommended to discuss this with her PCP.   2. Symptomatic bradycardia, s/p PPM in 2021, SVT HR well controlled. Recent remote device check showed normal device function noted. Denies any symptoms. Continue to follow-up with EP.    3. OSA on CPAP Encouraged continued compliance.    4. CKD stage 3b Most recent kidney function is stable but has shown progression over the past several years. Avoid nephrotoxic agents. Continue to follow with PCP.  Recommended to discuss nephrology referral with her PCP.  She verbalized understanding.    Dispo:  Follow-up with Dr. Dorn Ross or APP in 6 months or sooner if anything changes.   Signed, Almarie Crate, NP   "

## 2024-12-18 ENCOUNTER — Ambulatory Visit

## 2024-12-29 ENCOUNTER — Ambulatory Visit: Admitting: Nurse Practitioner

## 2025-03-19 ENCOUNTER — Ambulatory Visit

## 2025-06-18 ENCOUNTER — Ambulatory Visit

## 2025-09-17 ENCOUNTER — Ambulatory Visit

## 2025-12-17 ENCOUNTER — Ambulatory Visit
